# Patient Record
Sex: Male | Born: 1950 | Race: White | Hispanic: No | Marital: Married | Smoking: Current every day smoker
Health system: Southern US, Community
[De-identification: ages and names within clinical notes are randomized; demographics above are authoritative.]

## PROBLEM LIST (undated history)

## (undated) ENCOUNTER — Emergency Department (HOSPITAL_COMMUNITY): Admission: EM | Payer: Medicare Other | Source: Home / Self Care

## (undated) DIAGNOSIS — K449 Diaphragmatic hernia without obstruction or gangrene: Secondary | ICD-10-CM

## (undated) DIAGNOSIS — Z79899 Other long term (current) drug therapy: Secondary | ICD-10-CM

## (undated) DIAGNOSIS — Z972 Presence of dental prosthetic device (complete) (partial): Secondary | ICD-10-CM

## (undated) DIAGNOSIS — G5791 Unspecified mononeuropathy of right lower limb: Secondary | ICD-10-CM

## (undated) DIAGNOSIS — M810 Age-related osteoporosis without current pathological fracture: Secondary | ICD-10-CM

## (undated) DIAGNOSIS — G4700 Insomnia, unspecified: Secondary | ICD-10-CM

## (undated) DIAGNOSIS — F329 Major depressive disorder, single episode, unspecified: Secondary | ICD-10-CM

## (undated) DIAGNOSIS — N200 Calculus of kidney: Secondary | ICD-10-CM

## (undated) DIAGNOSIS — N401 Enlarged prostate with lower urinary tract symptoms: Secondary | ICD-10-CM

## (undated) DIAGNOSIS — I480 Paroxysmal atrial fibrillation: Secondary | ICD-10-CM

## (undated) DIAGNOSIS — J449 Chronic obstructive pulmonary disease, unspecified: Secondary | ICD-10-CM

## (undated) DIAGNOSIS — R635 Abnormal weight gain: Secondary | ICD-10-CM

## (undated) DIAGNOSIS — E785 Hyperlipidemia, unspecified: Secondary | ICD-10-CM

## (undated) DIAGNOSIS — I700 Atherosclerosis of aorta: Secondary | ICD-10-CM

## (undated) DIAGNOSIS — I4510 Unspecified right bundle-branch block: Secondary | ICD-10-CM

## (undated) DIAGNOSIS — F39 Unspecified mood [affective] disorder: Secondary | ICD-10-CM

## (undated) DIAGNOSIS — Z7901 Long term (current) use of anticoagulants: Secondary | ICD-10-CM

## (undated) DIAGNOSIS — I10 Essential (primary) hypertension: Secondary | ICD-10-CM

## (undated) DIAGNOSIS — F411 Generalized anxiety disorder: Secondary | ICD-10-CM

## (undated) DIAGNOSIS — G2581 Restless legs syndrome: Secondary | ICD-10-CM

## (undated) DIAGNOSIS — I509 Heart failure, unspecified: Secondary | ICD-10-CM

## (undated) DIAGNOSIS — N138 Other obstructive and reflux uropathy: Secondary | ICD-10-CM

## (undated) DIAGNOSIS — F319 Bipolar disorder, unspecified: Secondary | ICD-10-CM

## (undated) DIAGNOSIS — R413 Other amnesia: Secondary | ICD-10-CM

## (undated) DIAGNOSIS — K5790 Diverticulosis of intestine, part unspecified, without perforation or abscess without bleeding: Secondary | ICD-10-CM

## (undated) DIAGNOSIS — F32A Depression, unspecified: Secondary | ICD-10-CM

## (undated) DIAGNOSIS — G8929 Other chronic pain: Secondary | ICD-10-CM

## (undated) DIAGNOSIS — Z87442 Personal history of urinary calculi: Secondary | ICD-10-CM

## (undated) DIAGNOSIS — F0630 Mood disorder due to known physiological condition, unspecified: Secondary | ICD-10-CM

## (undated) DIAGNOSIS — I493 Ventricular premature depolarization: Secondary | ICD-10-CM

## (undated) DIAGNOSIS — F419 Anxiety disorder, unspecified: Secondary | ICD-10-CM

## (undated) DIAGNOSIS — Z973 Presence of spectacles and contact lenses: Secondary | ICD-10-CM

## (undated) HISTORY — DX: Anxiety disorder, unspecified: F41.9

## (undated) HISTORY — PX: TONSILLECTOMY: SUR1361

## (undated) HISTORY — DX: Other obstructive and reflux uropathy: N13.8

## (undated) HISTORY — DX: Major depressive disorder, single episode, unspecified: F32.9

## (undated) HISTORY — DX: Generalized anxiety disorder: F41.1

## (undated) HISTORY — PX: UPPER GI ENDOSCOPY: SHX6162

## (undated) HISTORY — DX: Heart failure, unspecified: I50.9

## (undated) HISTORY — DX: Bipolar disorder, unspecified: F31.9

## (undated) HISTORY — DX: Depression, unspecified: F32.A

## (undated) HISTORY — DX: Mood disorder due to known physiological condition, unspecified: F06.30

## (undated) HISTORY — PX: MULTIPLE TOOTH EXTRACTIONS: SHX2053

## (undated) HISTORY — DX: Hyperlipidemia, unspecified: E78.5

## (undated) HISTORY — DX: Insomnia, unspecified: G47.00

## (undated) HISTORY — DX: Other chronic pain: G89.29

## (undated) HISTORY — DX: Calculus of kidney: N20.0

## (undated) HISTORY — DX: Benign prostatic hyperplasia with lower urinary tract symptoms: N40.1

## (undated) HISTORY — DX: Abnormal weight gain: R63.5

---

## 2003-03-14 HISTORY — PX: HIP ARTHROPLASTY: SHX981

## 2003-03-14 HISTORY — PX: TOTAL HIP REVISION: SHX763

## 2003-10-08 ENCOUNTER — Ambulatory Visit (HOSPITAL_COMMUNITY): Admission: RE | Admit: 2003-10-08 | Discharge: 2003-10-08 | Payer: Self-pay | Admitting: Orthopedic Surgery

## 2003-10-22 ENCOUNTER — Emergency Department (HOSPITAL_COMMUNITY): Admission: EM | Admit: 2003-10-22 | Discharge: 2003-10-22 | Payer: Self-pay | Admitting: Emergency Medicine

## 2004-01-05 ENCOUNTER — Inpatient Hospital Stay (HOSPITAL_COMMUNITY): Admission: RE | Admit: 2004-01-05 | Discharge: 2004-01-09 | Payer: Self-pay | Admitting: Orthopaedic Surgery

## 2012-07-22 ENCOUNTER — Encounter (INDEPENDENT_AMBULATORY_CARE_PROVIDER_SITE_OTHER): Payer: Self-pay | Admitting: Surgery

## 2012-07-23 ENCOUNTER — Ambulatory Visit (INDEPENDENT_AMBULATORY_CARE_PROVIDER_SITE_OTHER): Payer: BC Managed Care – PPO | Admitting: Surgery

## 2012-07-23 ENCOUNTER — Encounter (INDEPENDENT_AMBULATORY_CARE_PROVIDER_SITE_OTHER): Payer: Self-pay | Admitting: Surgery

## 2012-07-23 VITALS — BP 122/78 | HR 76 | Temp 97.4°F | Resp 16 | Ht 72.0 in | Wt 206.0 lb

## 2012-07-23 DIAGNOSIS — K4020 Bilateral inguinal hernia, without obstruction or gangrene, not specified as recurrent: Secondary | ICD-10-CM

## 2012-07-23 HISTORY — DX: Bilateral inguinal hernia, without obstruction or gangrene, not specified as recurrent: K40.20

## 2012-07-23 NOTE — Progress Notes (Signed)
Patient ID: Carlos Mcduffey Jr., male   DOB: 09/30/1950, 61 y.o.   MRN: [id number]  Chief Complaint  Patient presents with  . Hernia    HPI Carlos Deeg Jr. is a 61 y.o. male.  Referred by Dr. 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 He developed pain in his left groin. Subsequently this developed into a bulge. Recently this has become significantly more painful with radiation down into his testicle. He was evaluated by Dr. Wilson who felt that he should have this surgically repaired. He presents now for evaluation. He denies any obstructive symptoms.  Past Medical History  Diagnosis Date  . Abnormal weight gain   . Anxiety   . Hypertrophy of prostate with urinary obstruction and other lower urinary tract symptoms (LUTS)   . Depression   . Hyperlipidemia   . Insomnia, unspecified   . Mood disorder in conditions classified elsewhere   . Other chronic pain     Past Surgical History  Procedure Laterality Date  . Total hip revision Left 2005    History reviewed. No pertinent family history.  Social History History  Substance Use Topics  . Smoking status: Current Every Day Smoker -- 0.50 packs/day    Types: Cigarettes  . Smokeless tobacco: Not on file  . Alcohol Use: No    No Known Allergies  Current Outpatient Prescriptions  Medication Sig Dispense Refill  . gabapentin (NEURONTIN) 300 MG capsule Take 300 mg by mouth 3 (three) times daily.      . LORazepam (ATIVAN) 0.5 MG tablet Take 0.5 mg by mouth every 8 (eight) hours.      . QUEtiapine (SEROQUEL) 100 MG tablet Take 100 mg by mouth at bedtime.      . terazosin (HYTRIN) 10 MG capsule Take 10 mg by mouth at bedtime.      . traMADol (ULTRAM) 50 MG tablet Take 50 mg by mouth every 6 (six) hours as needed for pain.       No current facility-administered medications for this visit.    Review of  Systems Review of Systems  Constitutional: Negative for fever, chills and unexpected weight change.  HENT: Negative for hearing loss, congestion, sore throat, trouble swallowing and voice change.   Eyes: Negative for visual disturbance.  Respiratory: Negative for cough and wheezing.   Cardiovascular: Negative for chest pain, palpitations and leg swelling.  Gastrointestinal: Positive for abdominal pain. Negative for nausea, vomiting, diarrhea, constipation, blood in stool, abdominal distention, anal bleeding and rectal pain.  Genitourinary: Positive for scrotal swelling and testicular pain. Negative for hematuria and difficulty urinating.  Musculoskeletal: Negative for arthralgias.  Skin: Negative for rash and wound.  Neurological: Negative for seizures, syncope, weakness and headaches.  Hematological: Negative for adenopathy. Does not bruise/bleed easily.  Psychiatric/Behavioral: Negative for confusion.    Blood pressure 122/78, pulse 76, temperature 97.4 Carlos (36.3 C), temperature source Temporal, resp. rate 16, height 6' (1.829 m), weight 206 lb (93.441 kg).  Physical Exam Physical Exam WDWN in NAD HEENT:  EOMI, sclera anicteric Neck:  No masses, no thyromegaly Lungs:  CTA bilaterally; normal respiratory effort CV:  Regular rate and rhythm; no murmurs Abd:  +bowel sounds, soft, non-tender, no masses GU:  Bilateral descended testes; no testicular masses Visible left inguinal hernia - reducible Moderate right inguinal hernia - reducible Ext:  Well-perfused; no edema Skin:  Warm,   dry; no sign of jaundice  Data Reviewed none  Assessment    Bilateral inguinal hernias - reducible     Plan    Laparoscopic preperitoneal bilateral inguinal hernia repairs with mesh.  The surgical procedure has been discussed with the patient.  Potential risks, benefits, alternative treatments, and expected outcomes have been explained.  All of the patient's questions at this time have been answered.   The likelihood of reaching the patient's treatment goal is good.  The patient understand the proposed surgical procedure and wishes to proceed.         Carlos Ferrari K. 07/23/2012, 6:03 PM    

## 2012-08-13 ENCOUNTER — Encounter (HOSPITAL_BASED_OUTPATIENT_CLINIC_OR_DEPARTMENT_OTHER): Payer: Self-pay | Admitting: *Deleted

## 2012-08-13 NOTE — Progress Notes (Signed)
No labs needed-no heart or resp problems 

## 2012-08-16 ENCOUNTER — Encounter (HOSPITAL_BASED_OUTPATIENT_CLINIC_OR_DEPARTMENT_OTHER): Payer: Self-pay | Admitting: *Deleted

## 2012-08-16 ENCOUNTER — Ambulatory Visit (HOSPITAL_BASED_OUTPATIENT_CLINIC_OR_DEPARTMENT_OTHER)
Admission: RE | Admit: 2012-08-16 | Discharge: 2012-08-16 | Disposition: A | Discharge status: Home / Self Care | Payer: BC Managed Care – PPO | Source: Ambulatory Visit | Attending: Surgery | Admitting: Surgery

## 2012-08-16 ENCOUNTER — Ambulatory Visit (HOSPITAL_BASED_OUTPATIENT_CLINIC_OR_DEPARTMENT_OTHER): Payer: BC Managed Care – PPO | Admitting: *Deleted

## 2012-08-16 ENCOUNTER — Encounter (HOSPITAL_BASED_OUTPATIENT_CLINIC_OR_DEPARTMENT_OTHER)
Admission: RE | Disposition: A | Discharge status: Home / Self Care | Payer: Self-pay | Source: Ambulatory Visit | Attending: Surgery

## 2012-08-16 DIAGNOSIS — G4700 Insomnia, unspecified: Secondary | ICD-10-CM | POA: Insufficient documentation

## 2012-08-16 DIAGNOSIS — F0630 Mood disorder due to known physiological condition, unspecified: Secondary | ICD-10-CM | POA: Insufficient documentation

## 2012-08-16 DIAGNOSIS — Z79899 Other long term (current) drug therapy: Secondary | ICD-10-CM | POA: Insufficient documentation

## 2012-08-16 DIAGNOSIS — R635 Abnormal weight gain: Secondary | ICD-10-CM | POA: Insufficient documentation

## 2012-08-16 DIAGNOSIS — F411 Generalized anxiety disorder: Secondary | ICD-10-CM | POA: Insufficient documentation

## 2012-08-16 DIAGNOSIS — K4020 Bilateral inguinal hernia, without obstruction or gangrene, not specified as recurrent: Secondary | ICD-10-CM | POA: Insufficient documentation

## 2012-08-16 DIAGNOSIS — F329 Major depressive disorder, single episode, unspecified: Secondary | ICD-10-CM | POA: Insufficient documentation

## 2012-08-16 DIAGNOSIS — N400 Enlarged prostate without lower urinary tract symptoms: Secondary | ICD-10-CM | POA: Insufficient documentation

## 2012-08-16 DIAGNOSIS — F3289 Other specified depressive episodes: Secondary | ICD-10-CM | POA: Insufficient documentation

## 2012-08-16 DIAGNOSIS — E785 Hyperlipidemia, unspecified: Secondary | ICD-10-CM | POA: Insufficient documentation

## 2012-08-16 DIAGNOSIS — F17200 Nicotine dependence, unspecified, uncomplicated: Secondary | ICD-10-CM | POA: Insufficient documentation

## 2012-08-16 HISTORY — PX: INSERTION OF MESH: SHX5868

## 2012-08-16 HISTORY — DX: Presence of dental prosthetic device (complete) (partial): Z97.2

## 2012-08-16 HISTORY — PX: INGUINAL HERNIA REPAIR: SHX194

## 2012-08-16 HISTORY — DX: Presence of spectacles and contact lenses: Z97.3

## 2012-08-16 SURGERY — REPAIR, HERNIA, INGUINAL, BILATERAL, LAPAROSCOPIC
Anesthesia: General | Site: Groin | Laterality: Bilateral | Wound class: Clean

## 2012-08-16 MED ORDER — HYDROMORPHONE HCL PF 1 MG/ML IJ SOLN: 1.0000 mg | INTRAMUSCULAR | Status: DC | PRN

## 2012-08-16 MED ORDER — DEXAMETHASONE SODIUM PHOSPHATE 4 MG/ML IJ SOLN
INTRAMUSCULAR | Status: DC | PRN
  Administered 2012-08-16: 10 mg via INTRAVENOUS

## 2012-08-16 MED ORDER — PROMETHAZINE HCL 25 MG/ML IJ SOLN: 6.2500 mg | INTRAMUSCULAR | Status: DC | PRN

## 2012-08-16 MED ORDER — LACTATED RINGERS IV SOLN
INTRAVENOUS | Status: DC
  Administered 2012-08-16 (×4): via INTRAVENOUS

## 2012-08-16 MED ORDER — GLYCOPYRROLATE 0.2 MG/ML IJ SOLN
INTRAMUSCULAR | Status: DC | PRN
  Administered 2012-08-16: 0.2 mg via INTRAVENOUS
  Administered 2012-08-16: .5 mg via INTRAVENOUS

## 2012-08-16 MED ORDER — OXYCODONE HCL 5 MG/5ML PO SOLN
5.0000 mg | Freq: Once | ORAL | Status: AC | PRN
Start: 2012-08-16 — End: 2012-08-16

## 2012-08-16 MED ORDER — CEFAZOLIN SODIUM-DEXTROSE 2-3 GM-% IV SOLR
2.0000 g | INTRAVENOUS | Status: AC
  Administered 2012-08-16: 2 g via INTRAVENOUS

## 2012-08-16 MED ORDER — ATROPINE SULFATE 0.4 MG/ML IJ SOLN
INTRAMUSCULAR | Status: DC | PRN
  Administered 2012-08-16: 0.2 mg via INTRAVENOUS

## 2012-08-16 MED ORDER — OXYCODONE-ACETAMINOPHEN 5-325 MG PO TABS: 1.0000 | ORAL_TABLET | ORAL | Status: DC | PRN

## 2012-08-16 MED ORDER — CHLORHEXIDINE GLUCONATE 4 % EX LIQD: 1.0000 "application " | Freq: Once | CUTANEOUS | Status: DC

## 2012-08-16 MED ORDER — BUPIVACAINE-EPINEPHRINE 0.25% -1:200000 IJ SOLN
INTRAMUSCULAR | Status: DC | PRN
  Administered 2012-08-16: 8 mL

## 2012-08-16 MED ORDER — ONDANSETRON HCL 4 MG/2ML IJ SOLN
INTRAMUSCULAR | Status: DC | PRN
  Administered 2012-08-16: 4 mg via INTRAVENOUS

## 2012-08-16 MED ORDER — NEOSTIGMINE METHYLSULFATE 1 MG/ML IJ SOLN
INTRAMUSCULAR | Status: DC | PRN
  Administered 2012-08-16: 3 mg via INTRAVENOUS

## 2012-08-16 MED ORDER — ONDANSETRON HCL 4 MG/2ML IJ SOLN: 4.0000 mg | INTRAMUSCULAR | Status: DC | PRN

## 2012-08-16 MED ORDER — ROCURONIUM BROMIDE 100 MG/10ML IV SOLN
INTRAVENOUS | Status: DC | PRN
  Administered 2012-08-16: 50 mg via INTRAVENOUS

## 2012-08-16 MED ORDER — LIDOCAINE HCL 4 % MT SOLN
OROMUCOSAL | Status: DC | PRN
  Administered 2012-08-16: 4 mL via TOPICAL

## 2012-08-16 MED ORDER — MIDAZOLAM HCL 5 MG/5ML IJ SOLN
INTRAMUSCULAR | Status: DC | PRN
  Administered 2012-08-16: 2 mg via INTRAVENOUS

## 2012-08-16 MED ORDER — FENTANYL CITRATE 0.05 MG/ML IJ SOLN
INTRAMUSCULAR | Status: DC | PRN
  Administered 2012-08-16: 25 ug via INTRAVENOUS
  Administered 2012-08-16: 100 ug via INTRAVENOUS
  Administered 2012-08-16: 50 ug via INTRAVENOUS
  Administered 2012-08-16: 25 ug via INTRAVENOUS

## 2012-08-16 MED ORDER — HYDROMORPHONE HCL PF 1 MG/ML IJ SOLN
0.2500 mg | INTRAMUSCULAR | Status: DC | PRN
  Administered 2012-08-16 (×2): 0.5 mg via INTRAVENOUS

## 2012-08-16 MED ORDER — OXYCODONE HCL 5 MG PO TABS
5.0000 mg | ORAL_TABLET | Freq: Once | ORAL | Status: AC | PRN
Start: 2012-08-16 — End: 2012-08-16
  Administered 2012-08-16: 5 mg via ORAL

## 2012-08-16 MED ORDER — PROPOFOL 10 MG/ML IV BOLUS
INTRAVENOUS | Status: DC | PRN
  Administered 2012-08-16: 200 mg via INTRAVENOUS

## 2012-08-16 SURGICAL SUPPLY — 35 items
APL SKNCLS STERI-STRIP NONHPOA (GAUZE/BANDAGES/DRESSINGS) ×2
APPLIER CLIP LOGIC TI 5 (MISCELLANEOUS) IMPLANT
APR CLP MED LRG 33X5 (MISCELLANEOUS)
BENZOIN TINCTURE PRP APPL 2/3 (GAUZE/BANDAGES/DRESSINGS) ×3 IMPLANT
CANISTER SUCTION 2500CC (MISCELLANEOUS) IMPLANT
CLOTH BEACON ORANGE TIMEOUT ST (SAFETY) ×3 IMPLANT
DECANTER SPIKE VIAL GLASS SM (MISCELLANEOUS) ×2 IMPLANT
DEVICE SECURE STRAP 25 ABSORB (INSTRUMENTS) ×3 IMPLANT
DISSECT BALLN SPACEMKR + OVL (BALLOONS) ×3
DISSECTOR BALLN SPACEMKR + OVL (BALLOONS) ×2 IMPLANT
DISSECTOR BLUNT TIP ENDO 5MM (MISCELLANEOUS) IMPLANT
DRAPE UTILITY XL STRL (DRAPES) ×5 IMPLANT
DRSG TEGADERM 2-3/8X2-3/4 SM (GAUZE/BANDAGES/DRESSINGS) ×2 IMPLANT
DRSG TEGADERM 4X4.75 (GAUZE/BANDAGES/DRESSINGS) ×1 IMPLANT
ELECT REM PT RETURN 9FT ADLT (ELECTROSURGICAL) ×3
ELECTRODE REM PT RTRN 9FT ADLT (ELECTROSURGICAL) ×2 IMPLANT
GLOVE BIO SURGEON STRL SZ 6.5 (GLOVE) ×1 IMPLANT
GLOVE BIO SURGEON STRL SZ7 (GLOVE) ×4 IMPLANT
GLOVE BIOGEL PI IND STRL 7.0 (GLOVE) IMPLANT
GLOVE BIOGEL PI IND STRL 7.5 (GLOVE) ×2 IMPLANT
GLOVE BIOGEL PI INDICATOR 7.0 (GLOVE) ×1
GLOVE BIOGEL PI INDICATOR 7.5 (GLOVE) ×1
GLOVE EXAM NITRILE MD LF STRL (GLOVE) ×2 IMPLANT
GOWN PREVENTION PLUS XLARGE (GOWN DISPOSABLE) ×6 IMPLANT
MESH 3DMAX LIGHT 4.1X6.2 LT LR (Mesh General) ×1 IMPLANT
MESH 3DMAX LIGHT 4.1X6.2 RT LR (Mesh General) ×1 IMPLANT
NS IRRIG 1000ML POUR BTL (IV SOLUTION) ×3 IMPLANT
PACK BASIN DAY SURGERY FS (CUSTOM PROCEDURE TRAY) ×3 IMPLANT
SET IRRIG TUBING LAPAROSCOPIC (IRRIGATION / IRRIGATOR) IMPLANT
SET TROCAR LAP APPLE-HUNT 5MM (ENDOMECHANICALS) ×3 IMPLANT
SPONGE GAUZE 2X2 8PLY STRL LF (GAUZE/BANDAGES/DRESSINGS) ×1 IMPLANT
SUT MNCRL AB 4-0 PS2 18 (SUTURE) ×3 IMPLANT
TOWEL OR 17X24 6PK STRL BLUE (TOWEL DISPOSABLE) ×3 IMPLANT
TRAY FOLEY CATH 14FR (SET/KITS/TRAYS/PACK) ×3 IMPLANT
TRAY LAPAROSCOPIC (CUSTOM PROCEDURE TRAY) ×3 IMPLANT

## 2012-08-16 NOTE — Anesthesia Preprocedure Evaluation (Signed)
Anesthesia Evaluation  Patient identified by MRN, date of birth, ID band Patient awake    Reviewed: Allergy & Precautions, H&P , NPO status , Patient's Chart, lab work & pertinent test results  Airway Mallampati: II TM Distance: >3 FB Neck ROM: Full    Dental  (+) Partial Upper and Dental Advisory Given   Pulmonary Current Smoker,    Pulmonary exam normal       Cardiovascular negative cardio ROS      Neuro/Psych PSYCHIATRIC DISORDERS Anxiety Depression negative neurological ROS     GI/Hepatic negative GI ROS, Neg liver ROS,   Endo/Other  negative endocrine ROS  Renal/GU negative Renal ROS     Musculoskeletal   Abdominal   Peds  Hematology negative hematology ROS (+)   Anesthesia Other Findings   Reproductive/Obstetrics                           Anesthesia Physical Anesthesia Plan  ASA: II  Anesthesia Plan: General   Post-op Pain Management:    Induction: Intravenous  Airway Management Planned: Oral ETT  Additional Equipment:   Intra-op Plan:   Post-operative Plan: Extubation in OR  Informed Consent: I have reviewed the patients History and Physical, chart, labs and discussed the procedure including the risks, benefits and alternatives for the proposed anesthesia with the patient or authorized representative who has indicated his/her understanding and acceptance.   Dental advisory given  Plan Discussed with: CRNA, Anesthesiologist and Surgeon  Anesthesia Plan Comments:         Anesthesia Quick Evaluation

## 2012-08-16 NOTE — Op Note (Signed)
Preop diagnosis: Bilateral inguinal hernias Postop diagnosis: Same Procedure performed: Laparoscopic bilateral preperitoneal inguinal hernia repairs with mesh Surgeon:Twana Wileman K. Anesthesia: Gen. Endotracheal Indications: This is a 62 year old male who presents with bilateral inguinal hernias. They remains reducible. He was examined in the office and was felt a candidate for laparoscopic bilateral hernia repairs.  Description of procedure: The patient brought to the operating room and placed in supine position on the operating room table. After an adequate level of general anesthesia was obtained a Foley catheter was placed under sterile technique. The patient's lower abdomen was shaved prepped with chlor prep and his groin was prepped with Betadine. We draped in sterile fashion. A timeout was taken to ensure the proper patient proper procedure. We selected a point about 2 cm below the umbilicus just to the right of midline. We made a transverse incision here. Dissection was carried down to the anterior rectus sheath which was incised transversely. We bluntly dissected around the rectus muscle retracted it laterally. We entered the rectum muscular space. We then inserted the spacemaker balloon device in the rectum muscular space down to the symphysis pubis. The obturator was removed and we inserted the 10 mm laparoscope. We inflated the balloon completely and held this for several minutes for hemostasis. This was done under direct vision with the scope inside the balloon. We then removed the scope and the spacemaker balloon. We insufflated CO2 into the preperitoneal space maintaining a maximum pressure of 15 mm mercury. Initially the patient had some hypotension and bradycardia with insufflation. We released our insufflation and anesthesia treated his bradycardia. Once his vital signs were stable, we reinsufflated the preperitoneal space. We inserted 2 5 mm trochars in the lower midline. We began working  on the patient's left side. We open the preperitoneal space all the way out past the anterior superior iliac spine. We examined the abdominal wall and found no evidence of a direct hernia defect. We then reduced a fairly large indirect hernia. We reduced this  completely back to the peritoneum.  Once we had completed our dissection we used a large left-sided piece of 3-D max light mesh. This was rolled up and inserted into the preperitoneal space. We unrolled the mesh and positioned it in the correct position. This mesh covered both the indirect and direct spaces and extended medially to the midline. The lower edge of the mesh sat below the edge of the peritoneum. We secured this with 4 secure strap tacks. We then turned our attention to the patient's right side. We opened the preperitoneal space out to the anterior superior iliac spine.  The patient has no direct hernia defect. He has a smaller indirect hernia on the right. We reduces completely. We used a large right-sided piece of 3-D max light mesh. This was positioned in similar fashion to the left. This extended from the midline out close to the anterior superior iliac . The lower edge septal lobe of the peritoneum. We secured this with 4 secure strap tacks. We inspected for hemostasis. We then released pneumo preperitoneum under direct vision. Both hernia sacs came to lay on top of the mesh. The patient has significant air that had tracked down into his scrotum. We squeezed as much of this out as possible. We then removed our trochars. The anterior rectus sheath was closed with 0 Vicryl. 4 Monocryl was used for the skin incisions. Steri-Strips and clean dressings were applied. The Foley catheter was removed and the patient was then extubated and brought to  the recovery room in stable condition. All sponge, instrument, and needle counts are correct.  Wilmon Arms. Corliss Skains, MD, [facility]  General/ Trauma Surgery  08/16/2012 2:18 PM

## 2012-08-16 NOTE — Interval H&P Note (Signed)
History and Physical Interval Note:  08/16/2012 10:53 AM  [name]  has presented today for surgery, with the diagnosis of bilateral inguinal hernias  The various methods of treatment have been discussed with the patient and family. After consideration of risks, benefits and other options for treatment, the patient has consented to  Procedure(s): LAPAROSCOPIC BILATERAL INGUINAL HERNIA REPAIR (Bilateral) INSERTION OF MESH (Bilateral) as a surgical intervention .  The patient's history has been reviewed, patient examined, no change in status, stable for surgery.  I have reviewed the patient's chart and labs.  Questions were answered to the patient's satisfaction.     Monserratt Knezevic K.

## 2012-08-16 NOTE — Transfer of Care (Signed)
Immediate Anesthesia Transfer of Care Note  Patient: Carlos Short.  Procedure(s) Performed: Procedure(s): LAPAROSCOPIC BILATERAL INGUINAL HERNIA REPAIR (Bilateral) INSERTION OF MESH (Bilateral)  Patient Location: PACU  Anesthesia Type:General  Level of Consciousness: awake, alert  and oriented  Airway & Oxygen Therapy: Patient Spontanous Breathing and Patient connected to face mask oxygen  Post-op Assessment: Report given to PACU RN, Post -op Vital signs reviewed and stable and Patient moving all extremities  Post vital signs: Reviewed and stable  Complications: No apparent anesthesia complications

## 2012-08-16 NOTE — H&P (View-Only) (Signed)
Patient ID: Carlos Short., male   DOB: 11-Nov-1950, 62 y.o.   MRN: [id number]  Chief Complaint  Patient presents with  . Hernia    HPI [name] is a 62 y.o. male.  Referred by Dr. 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 He developed pain in his left groin. Subsequently this developed into a bulge. Recently this has become significantly more painful with radiation down into his testicle. He was evaluated by Dr. Andrey Campanile who felt that he should have this surgically repaired. He presents now for evaluation. He denies any obstructive symptoms.  Past Medical History  Diagnosis Date  . Abnormal weight gain   . Anxiety   . Hypertrophy of prostate with urinary obstruction and other lower urinary tract symptoms (LUTS)   . Depression   . Hyperlipidemia   . Insomnia, unspecified   . Mood disorder in conditions classified elsewhere   . Other chronic pain     Past Surgical History  Procedure Laterality Date  . Total hip revision Left 2005    History reviewed. No pertinent family history.  Social History History  Substance Use Topics  . Smoking status: Current Every Day Smoker -- 0.50 packs/day    Types: Cigarettes  . Smokeless tobacco: Not on file  . Alcohol Use: No    No Known Allergies  Current Outpatient Prescriptions  Medication Sig Dispense Refill  . gabapentin (NEURONTIN) 300 MG capsule Take 300 mg by mouth 3 (three) times daily.      Marland Kitchen LORazepam (ATIVAN) 0.5 MG tablet Take 0.5 mg by mouth every 8 (eight) hours.      Marland Kitchen QUEtiapine (SEROQUEL) 100 MG tablet Take 100 mg by mouth at bedtime.      Marland Kitchen terazosin (HYTRIN) 10 MG capsule Take 10 mg by mouth at bedtime.      . traMADol (ULTRAM) 50 MG tablet Take 50 mg by mouth every 6 (six) hours as needed for pain.       No current facility-administered medications for this visit.    Review of  Systems Review of Systems  Constitutional: Negative for fever, chills and unexpected weight change.  HENT: Negative for hearing loss, congestion, sore throat, trouble swallowing and voice change.   Eyes: Negative for visual disturbance.  Respiratory: Negative for cough and wheezing.   Cardiovascular: Negative for chest pain, palpitations and leg swelling.  Gastrointestinal: Positive for abdominal pain. Negative for nausea, vomiting, diarrhea, constipation, blood in stool, abdominal distention, anal bleeding and rectal pain.  Genitourinary: Positive for scrotal swelling and testicular pain. Negative for hematuria and difficulty urinating.  Musculoskeletal: Negative for arthralgias.  Skin: Negative for rash and wound.  Neurological: Negative for seizures, syncope, weakness and headaches.  Hematological: Negative for adenopathy. Does not bruise/bleed easily.  Psychiatric/Behavioral: Negative for confusion.    Blood pressure 122/78, pulse 76, temperature 97.4 F (36.3 C), temperature source Temporal, resp. rate 16, height 6' (1.829 m), weight 206 lb (93.441 kg).  Physical Exam Physical Exam WDWN in NAD HEENT:  EOMI, sclera anicteric Neck:  No masses, no thyromegaly Lungs:  CTA bilaterally; normal respiratory effort CV:  Regular rate and rhythm; no murmurs Abd:  +bowel sounds, soft, non-tender, no masses GU:  Bilateral descended testes; no testicular masses Visible left inguinal hernia - reducible Moderate right inguinal hernia - reducible Ext:  Well-perfused; no edema Skin:  Warm,  dry; no sign of jaundice  Data Reviewed none  Assessment    Bilateral inguinal hernias - reducible     Plan    Laparoscopic preperitoneal bilateral inguinal hernia repairs with mesh.  The surgical procedure has been discussed with the patient.  Potential risks, benefits, alternative treatments, and expected outcomes have been explained.  All of the patient's questions at this time have been answered.   The likelihood of reaching the patient's treatment goal is good.  The patient understand the proposed surgical procedure and wishes to proceed.         Carlos Short K. 07/23/2012, 6:03 PM

## 2012-08-16 NOTE — Anesthesia Postprocedure Evaluation (Signed)
Anesthesia Post Note  Patient: Carlos Short.  Procedure(s) Performed: Procedure(s) (LRB): LAPAROSCOPIC BILATERAL INGUINAL HERNIA REPAIR (Bilateral) INSERTION OF MESH (Bilateral)  Anesthesia type: general  Patient location: PACU  Post pain: Pain level controlled  Post assessment: Patient's Cardiovascular Status Stable  Last Vitals:  Filed Vitals:   08/16/12 1530  BP:   Pulse: 54  Temp:   Resp: 20    Post vital signs: Reviewed and stable  Level of consciousness: sedated  Complications: No apparent anesthesia complications

## 2012-08-16 NOTE — Anesthesia Procedure Notes (Signed)
Procedure Name: Intubation Date/Time: 08/16/2012 12:39 PM Performed by: Meyer Russel Pre-anesthesia Checklist: Patient identified, Emergency Drugs available, Suction available and Patient being monitored Patient Re-evaluated:Patient Re-evaluated prior to inductionOxygen Delivery Method: Circle System Utilized Preoxygenation: Pre-oxygenation with 100% oxygen Intubation Type: IV induction Ventilation: Mask ventilation without difficulty Laryngoscope Size: Miller and 2 Grade View: Grade I Tube type: Oral Tube size: 8.0 mm Number of attempts: 1 Airway Equipment and Method: LTA kit utilized Placement Confirmation: ETT inserted through vocal cords under direct vision,  positive ETCO2 and breath sounds checked- equal and bilateral Secured at: 24 cm Tube secured with: Tape Dental Injury: Teeth and Oropharynx as per pre-operative assessment

## 2012-08-19 ENCOUNTER — Encounter (HOSPITAL_BASED_OUTPATIENT_CLINIC_OR_DEPARTMENT_OTHER): Payer: Self-pay | Admitting: Surgery

## 2012-08-23 ENCOUNTER — Other Ambulatory Visit (INDEPENDENT_AMBULATORY_CARE_PROVIDER_SITE_OTHER): Payer: Self-pay

## 2012-08-23 ENCOUNTER — Telehealth (INDEPENDENT_AMBULATORY_CARE_PROVIDER_SITE_OTHER): Payer: Self-pay

## 2012-08-23 ENCOUNTER — Telehealth (INDEPENDENT_AMBULATORY_CARE_PROVIDER_SITE_OTHER): Payer: Self-pay | Admitting: *Deleted

## 2012-08-23 DIAGNOSIS — G8918 Other acute postprocedural pain: Secondary | ICD-10-CM

## 2012-08-23 MED ORDER — OXYCODONE-ACETAMINOPHEN 5-325 MG PO TABS: 1.0000 | ORAL_TABLET | ORAL | Status: DC | PRN

## 2012-08-23 NOTE — Telephone Encounter (Signed)
Called and left message for patient.  Rec'd a refill request for Oxycodone 5/325 mg.  Dr. Corliss Skains not available.  Prescription refill request given to Dr. Johna Sheriff (Urgent Office)  Patient s/p Lap BIH 08/16/12.  RX at front desk for patient to pick up.

## 2012-08-23 NOTE — Telephone Encounter (Signed)
Wife called to request a refill of patients pain medication to insure he doesn't run out over the weekend.  Per protocol Norco 5/325mg  1 tablet every 4-6 hours as needed for pain #30 no refills was called into [phone].

## 2012-09-03 ENCOUNTER — Ambulatory Visit (INDEPENDENT_AMBULATORY_CARE_PROVIDER_SITE_OTHER): Payer: BC Managed Care – PPO | Admitting: Surgery

## 2012-09-03 ENCOUNTER — Encounter (INDEPENDENT_AMBULATORY_CARE_PROVIDER_SITE_OTHER): Payer: Self-pay | Admitting: Surgery

## 2012-09-03 VITALS — BP 128/86 | HR 60 | Temp 97.4°F | Resp 12 | Ht 72.0 in | Wt 203.8 lb

## 2012-09-03 DIAGNOSIS — K4020 Bilateral inguinal hernia, without obstruction or gangrene, not specified as recurrent: Secondary | ICD-10-CM

## 2012-09-03 MED ORDER — HYDROCODONE-ACETAMINOPHEN 5-325 MG PO TABS: 1.0000 | ORAL_TABLET | Freq: Four times a day (QID) | ORAL | Status: DC | PRN

## 2012-09-03 NOTE — Progress Notes (Signed)
Status post laparoscopic repair of bilateral inguinal hernias on 08/16/13. The patient seems to be doing fairly well. No sign of recurrent hernia. Incisions are all well-healed. His main complaint is occasional sharp pain shooting down to his left testicle. There are no testicular masses. The spermatic cord is nontender. His tenderness seems to be at the external ring. It is possible that he is having some impingement on the ileo inguinal nerve. The tacks holding the mesh in place are absorbable.    He should continue limiting his level of activity over the next few weeks. No heavy lifting. He may use Advil as needed for the nerve pain. He should call us back if there's no improvement.  Carlos Short. Corliss Skains, MD, [facility]  General/ Trauma Surgery  09/03/2012 2:07 PM

## 2013-01-16 ENCOUNTER — Other Ambulatory Visit: Payer: Self-pay

## 2015-04-26 DIAGNOSIS — Z8719 Personal history of other diseases of the digestive system: Secondary | ICD-10-CM | POA: Insufficient documentation

## 2015-04-26 DIAGNOSIS — Z87442 Personal history of urinary calculi: Secondary | ICD-10-CM | POA: Insufficient documentation

## 2015-06-08 DIAGNOSIS — F4323 Adjustment disorder with mixed anxiety and depressed mood: Secondary | ICD-10-CM | POA: Insufficient documentation

## 2015-11-09 DIAGNOSIS — K2900 Acute gastritis without bleeding: Secondary | ICD-10-CM | POA: Insufficient documentation

## 2015-11-09 HISTORY — DX: Acute gastritis without bleeding: K29.00

## 2016-04-13 DIAGNOSIS — G894 Chronic pain syndrome: Secondary | ICD-10-CM | POA: Diagnosis present

## 2016-04-13 DIAGNOSIS — N401 Enlarged prostate with lower urinary tract symptoms: Secondary | ICD-10-CM | POA: Insufficient documentation

## 2016-04-13 DIAGNOSIS — R3912 Poor urinary stream: Secondary | ICD-10-CM

## 2016-04-13 HISTORY — DX: Benign prostatic hyperplasia with lower urinary tract symptoms: N40.1

## 2016-04-13 HISTORY — DX: Chronic pain syndrome: G89.4

## 2016-09-08 ENCOUNTER — Other Ambulatory Visit: Payer: Self-pay | Admitting: Physician Assistant

## 2016-09-08 DIAGNOSIS — M8008XG Age-related osteoporosis with current pathological fracture, vertebra(e), subsequent encounter for fracture with delayed healing: Secondary | ICD-10-CM

## 2016-09-08 DIAGNOSIS — M8008XD Age-related osteoporosis with current pathological fracture, vertebra(e), subsequent encounter for fracture with routine healing: Secondary | ICD-10-CM

## 2016-09-14 ENCOUNTER — Other Ambulatory Visit: Payer: Self-pay

## 2016-10-06 DIAGNOSIS — S32010A Wedge compression fracture of first lumbar vertebra, initial encounter for closed fracture: Secondary | ICD-10-CM

## 2016-10-06 HISTORY — DX: Wedge compression fracture of first lumbar vertebra, initial encounter for closed fracture: S32.010A

## 2016-10-19 ENCOUNTER — Inpatient Hospital Stay
Admission: RE | Admit: 2016-10-19 | Discharge: 2016-10-19 | Disposition: A | Discharge status: Home / Self Care | Payer: Self-pay | Source: Ambulatory Visit | Attending: Physician Assistant | Admitting: Physician Assistant

## 2017-04-11 DIAGNOSIS — G8929 Other chronic pain: Secondary | ICD-10-CM

## 2017-04-11 DIAGNOSIS — G2581 Restless legs syndrome: Secondary | ICD-10-CM | POA: Insufficient documentation

## 2017-04-11 DIAGNOSIS — Z0000 Encounter for general adult medical examination without abnormal findings: Secondary | ICD-10-CM | POA: Insufficient documentation

## 2017-04-11 DIAGNOSIS — M545 Low back pain: Secondary | ICD-10-CM

## 2017-04-11 HISTORY — DX: Other chronic pain: G89.29

## 2017-05-16 ENCOUNTER — Other Ambulatory Visit: Payer: Self-pay | Admitting: Family Medicine

## 2017-05-16 DIAGNOSIS — S32010D Wedge compression fracture of first lumbar vertebra, subsequent encounter for fracture with routine healing: Secondary | ICD-10-CM

## 2017-05-21 ENCOUNTER — Ambulatory Visit
Admission: RE | Admit: 2017-05-21 | Discharge: 2017-05-21 | Disposition: A | Discharge status: Home / Self Care | Payer: Medicare Other | Source: Ambulatory Visit | Attending: Physician Assistant | Admitting: Physician Assistant

## 2017-05-21 DIAGNOSIS — M8008XG Age-related osteoporosis with current pathological fracture, vertebra(e), subsequent encounter for fracture with delayed healing: Secondary | ICD-10-CM

## 2017-05-25 ENCOUNTER — Ambulatory Visit
Admission: RE | Admit: 2017-05-25 | Discharge: 2017-05-25 | Disposition: A | Discharge status: Home / Self Care | Payer: Medicare Other | Source: Ambulatory Visit | Attending: Family Medicine | Admitting: Family Medicine

## 2017-05-25 DIAGNOSIS — S32010D Wedge compression fracture of first lumbar vertebra, subsequent encounter for fracture with routine healing: Secondary | ICD-10-CM

## 2017-06-13 ENCOUNTER — Other Ambulatory Visit: Payer: Self-pay

## 2017-06-13 ENCOUNTER — Inpatient Hospital Stay (HOSPITAL_COMMUNITY)
Admission: EM | Admit: 2017-06-13 | Discharge: 2017-06-14 | DRG: 871 | Disposition: A | Discharge status: Home / Self Care | Payer: Medicare Other | Attending: Internal Medicine | Admitting: Internal Medicine

## 2017-06-13 ENCOUNTER — Emergency Department (HOSPITAL_COMMUNITY): Payer: Medicare Other

## 2017-06-13 ENCOUNTER — Encounter (HOSPITAL_COMMUNITY): Payer: Self-pay

## 2017-06-13 DIAGNOSIS — E785 Hyperlipidemia, unspecified: Secondary | ICD-10-CM | POA: Diagnosis not present

## 2017-06-13 DIAGNOSIS — F419 Anxiety disorder, unspecified: Secondary | ICD-10-CM | POA: Diagnosis present

## 2017-06-13 DIAGNOSIS — Z79899 Other long term (current) drug therapy: Secondary | ICD-10-CM | POA: Diagnosis not present

## 2017-06-13 DIAGNOSIS — G9341 Metabolic encephalopathy: Secondary | ICD-10-CM

## 2017-06-13 DIAGNOSIS — I714 Abdominal aortic aneurysm, without rupture, unspecified: Secondary | ICD-10-CM

## 2017-06-13 DIAGNOSIS — I7143 Infrarenal abdominal aortic aneurysm, without rupture: Secondary | ICD-10-CM

## 2017-06-13 DIAGNOSIS — M4856XA Collapsed vertebra, not elsewhere classified, lumbar region, initial encounter for fracture: Secondary | ICD-10-CM | POA: Diagnosis present

## 2017-06-13 DIAGNOSIS — A419 Sepsis, unspecified organism: Secondary | ICD-10-CM | POA: Diagnosis present

## 2017-06-13 DIAGNOSIS — G4700 Insomnia, unspecified: Secondary | ICD-10-CM | POA: Diagnosis not present

## 2017-06-13 DIAGNOSIS — N390 Urinary tract infection, site not specified: Secondary | ICD-10-CM

## 2017-06-13 DIAGNOSIS — F39 Unspecified mood [affective] disorder: Secondary | ICD-10-CM

## 2017-06-13 DIAGNOSIS — N400 Enlarged prostate without lower urinary tract symptoms: Secondary | ICD-10-CM | POA: Diagnosis not present

## 2017-06-13 DIAGNOSIS — S32010A Wedge compression fracture of first lumbar vertebra, initial encounter for closed fracture: Secondary | ICD-10-CM | POA: Diagnosis present

## 2017-06-13 DIAGNOSIS — Z87891 Personal history of nicotine dependence: Secondary | ICD-10-CM | POA: Diagnosis not present

## 2017-06-13 DIAGNOSIS — G894 Chronic pain syndrome: Secondary | ICD-10-CM | POA: Diagnosis present

## 2017-06-13 DIAGNOSIS — F329 Major depressive disorder, single episode, unspecified: Secondary | ICD-10-CM | POA: Diagnosis present

## 2017-06-13 HISTORY — DX: Urinary tract infection, site not specified: N39.0

## 2017-06-13 HISTORY — DX: Infrarenal abdominal aortic aneurysm, without rupture: I71.43

## 2017-06-13 HISTORY — DX: Metabolic encephalopathy: G93.41

## 2017-06-13 LAB — CBC WITH DIFFERENTIAL/PLATELET
BASOS PCT: 0 %
Basophils Absolute: 0 10*3/uL (ref 0.0–0.1)
EOS ABS: 0.1 10*3/uL (ref 0.0–0.7)
EOS PCT: 1 %
HCT: 44.7 % (ref 39.0–52.0)
Hemoglobin: 15.8 g/dL (ref 13.0–17.0)
Lymphocytes Relative: 9 %
Lymphs Abs: 0.7 10*3/uL (ref 0.7–4.0)
MCH: 32 pg (ref 26.0–34.0)
MCHC: 35.3 g/dL (ref 30.0–36.0)
MCV: 90.7 fL (ref 78.0–100.0)
MONO ABS: 0.3 10*3/uL (ref 0.1–1.0)
Monocytes Relative: 4 %
NEUTROS ABS: 6.9 10*3/uL (ref 1.7–7.7)
Neutrophils Relative %: 86 %
PLATELETS: 148 10*3/uL — AB (ref 150–400)
RBC: 4.93 MIL/uL (ref 4.22–5.81)
RDW: 13.1 % (ref 11.5–15.5)
WBC: 8 10*3/uL (ref 4.0–10.5)

## 2017-06-13 LAB — URINALYSIS, ROUTINE W REFLEX MICROSCOPIC
Bilirubin Urine: NEGATIVE
GLUCOSE, UA: NEGATIVE mg/dL
Hgb urine dipstick: NEGATIVE
Ketones, ur: NEGATIVE mg/dL
Nitrite: POSITIVE — AB
PH: 8 (ref 5.0–8.0)
PROTEIN: NEGATIVE mg/dL
Specific Gravity, Urine: 1.015 (ref 1.005–1.030)

## 2017-06-13 LAB — BASIC METABOLIC PANEL
ANION GAP: 9 (ref 5–15)
BUN: 17 mg/dL (ref 6–20)
CALCIUM: 8.8 mg/dL — AB (ref 8.9–10.3)
CO2: 22 mmol/L (ref 22–32)
CREATININE: 1.11 mg/dL (ref 0.61–1.24)
Chloride: 105 mmol/L (ref 101–111)
Glucose, Bld: 106 mg/dL — ABNORMAL HIGH (ref 65–99)
Potassium: 3.6 mmol/L (ref 3.5–5.1)
Sodium: 136 mmol/L (ref 135–145)

## 2017-06-13 LAB — I-STAT CG4 LACTIC ACID, ED: Lactic Acid, Venous: 0.93 mmol/L (ref 0.5–1.9)

## 2017-06-13 LAB — C-REACTIVE PROTEIN: CRP: 1.2 mg/dL — AB (ref <1.0)

## 2017-06-13 LAB — SEDIMENTATION RATE: Sed Rate: 12 mm/hr (ref 0–16)

## 2017-06-13 MED ORDER — ONDANSETRON HCL 4 MG/2ML IJ SOLN
4.0000 mg | Freq: Once | INTRAMUSCULAR | Status: AC
  Administered 2017-06-13: 4 mg via INTRAVENOUS
  Filled 2017-06-13: qty 2

## 2017-06-13 MED ORDER — DOXAZOSIN MESYLATE 8 MG PO TABS
16.0000 mg | ORAL_TABLET | Freq: Every day | ORAL | Status: DC
  Administered 2017-06-13 – 2017-06-14 (×2): 16 mg via ORAL
  Filled 2017-06-13 (×2): qty 2

## 2017-06-13 MED ORDER — ONDANSETRON HCL 4 MG/2ML IJ SOLN: 4.0000 mg | Freq: Four times a day (QID) | INTRAMUSCULAR | Status: DC | PRN

## 2017-06-13 MED ORDER — PROCHLORPERAZINE EDISYLATE 5 MG/ML IJ SOLN
10.0000 mg | Freq: Four times a day (QID) | INTRAMUSCULAR | Status: DC | PRN
  Administered 2017-06-13: 10 mg via INTRAVENOUS
  Filled 2017-06-13: qty 2

## 2017-06-13 MED ORDER — SERTRALINE HCL 50 MG PO TABS
150.0000 mg | ORAL_TABLET | Freq: Every day | ORAL | Status: DC
  Administered 2017-06-13 – 2017-06-14 (×2): 150 mg via ORAL
  Filled 2017-06-13 (×2): qty 1

## 2017-06-13 MED ORDER — VANCOMYCIN HCL 10 G IV SOLR
2000.0000 mg | Freq: Once | INTRAVENOUS | Status: AC
  Administered 2017-06-13: 2000 mg via INTRAVENOUS
  Filled 2017-06-13: qty 2000

## 2017-06-13 MED ORDER — ACETAMINOPHEN 500 MG PO TABS
1000.0000 mg | ORAL_TABLET | Freq: Once | ORAL | Status: AC
  Administered 2017-06-13: 1000 mg via ORAL
  Filled 2017-06-13: qty 2

## 2017-06-13 MED ORDER — DEXAMETHASONE SODIUM PHOSPHATE 10 MG/ML IJ SOLN
10.0000 mg | Freq: Once | INTRAMUSCULAR | Status: AC
  Administered 2017-06-13: 10 mg via INTRAVENOUS
  Filled 2017-06-13: qty 1

## 2017-06-13 MED ORDER — CEFEPIME HCL 2 G IJ SOLR
2.0000 g | Freq: Once | INTRAMUSCULAR | Status: AC
  Administered 2017-06-13: 2 g via INTRAVENOUS
  Filled 2017-06-13: qty 2

## 2017-06-13 MED ORDER — CYCLOBENZAPRINE HCL 5 MG PO TABS
5.0000 mg | ORAL_TABLET | Freq: Three times a day (TID) | ORAL | Status: DC | PRN
  Administered 2017-06-13: 5 mg via ORAL
  Filled 2017-06-13 (×2): qty 1

## 2017-06-13 MED ORDER — SODIUM CHLORIDE 0.9 % IV BOLUS
1000.0000 mL | Freq: Once | INTRAVENOUS | Status: AC
  Administered 2017-06-13: 1000 mL via INTRAVENOUS

## 2017-06-13 MED ORDER — HYDROMORPHONE HCL 1 MG/ML IJ SOLN
1.0000 mg | Freq: Once | INTRAMUSCULAR | Status: AC
  Administered 2017-06-13: 1 mg via INTRAVENOUS
  Filled 2017-06-13: qty 1

## 2017-06-13 MED ORDER — INDOMETHACIN 25 MG PO CAPS
25.0000 mg | ORAL_CAPSULE | Freq: Three times a day (TID) | ORAL | Status: DC | PRN
  Administered 2017-06-13 – 2017-06-14 (×2): 25 mg via ORAL
  Filled 2017-06-13 (×3): qty 1

## 2017-06-13 MED ORDER — SODIUM CHLORIDE 0.9 % IV SOLN
1.0000 g | INTRAVENOUS | Status: DC
  Administered 2017-06-13: 1 g via INTRAVENOUS
  Filled 2017-06-13: qty 1

## 2017-06-13 MED ORDER — GADOBENATE DIMEGLUMINE 529 MG/ML IV SOLN: 20.0000 mL | Freq: Once | INTRAVENOUS | Status: DC | PRN

## 2017-06-13 MED ORDER — ENOXAPARIN SODIUM 40 MG/0.4ML ~~LOC~~ SOLN
40.0000 mg | SUBCUTANEOUS | Status: DC
  Administered 2017-06-13: 40 mg via SUBCUTANEOUS
  Filled 2017-06-13: qty 0.4

## 2017-06-13 MED ORDER — DOCUSATE SODIUM 100 MG PO CAPS
100.0000 mg | ORAL_CAPSULE | Freq: Two times a day (BID) | ORAL | Status: DC
  Administered 2017-06-14: 100 mg via ORAL
  Filled 2017-06-13 (×2): qty 1

## 2017-06-13 MED ORDER — ONDANSETRON HCL 4 MG PO TABS: 4.0000 mg | ORAL_TABLET | Freq: Four times a day (QID) | ORAL | Status: DC | PRN

## 2017-06-13 MED ORDER — ACETAMINOPHEN 500 MG PO TABS
1000.0000 mg | ORAL_TABLET | Freq: Four times a day (QID) | ORAL | Status: DC | PRN
  Administered 2017-06-14: 1000 mg via ORAL
  Filled 2017-06-13: qty 2

## 2017-06-13 NOTE — H&P (Signed)
History and Physical    Carlos Short. WUJ:[id number] DOB: 1950-05-22 DOA: 06/13/2017  Referring MD/NP/PA: Shaune Short PCP: Carlos Banner, MD   Patient coming from: home  Chief Complaint: back pain, confusion  HPI: Carlos Short. is a 67 y.o. male with history of mood disorder, chronic pain who presents with back pain and increased confusion.  According to the patient's wife, the patient had a back injury and has had chronic back pain since that time.  He manages it with Flexeril.  He states that he takes his Flexeril and his tramadol regularly but has not ever increased his dose or taking it any other way than as prescribed.  He feels that his back pain had been somewhat worse over the last few days.  He felt fine over the weekend but has noticed some increased urinary frequency and urgency and has not been able to empty his bladder all the way.  He is not entirely compliant with his medication for BPH.  This morning, his wife noticed that he was slurring his words and using garbled language and not acting like himself.  She was worried he was having a stroke and they came to the emergency department.  He denied any focal weakness of an arm or leg.  He denied numbness.  ED Course: Vital signs notable for tachycardia to 120 when he first arrived in the ER, however heart rate trended down to the 80s with initiation of IV fluids and antibiotics.  His blood pressure remained stable.  Temperature 101.30F labs: White blood cell count 8, lactic acid 0.93.  Urinalysis with large leukocyte esterase, positive nitrites, few bacteria, too numerous to count WBC.  Head CT was normal.  MRI of the brain demonstrated atrophy with mild chronic microvascular ischemia but no acute abnormalities.  MRI of the lumbar spine again showed a compression fracture at L1 with near complete loss of height centrally and anteriorly and an L2 inferior endplate fracture, without spinal cord compression or stenosis.   There was no evidence of epidural abscess.  The patient was given antibiotics empirically due to his fever and tachycardia according to the sepsis protocol.  His mentation and speech were already improving in the emergency department.  Review of Systems:  General: Positive fevers, chills, denies weight change HEENT:  Denies changes to hearing and vision, sinus congestion, sore throat.  Positive headache CV:  Denies chest pain, palpitations, lower extremity edema.  PULM:  Denies SOB, cough.   GI:  Denies nausea, vomiting, constipation, diarrhea.   GU: Positive dysuria, frequency, urgency ENDO:  Denies polyuria, polydipsia.   HEME:  Denies blood in stools, abnormal bruising or bleeding.  LYMPH:  Denies lymphadenopathy.   MSK:  Denies arthralgias, myalgias.   DERM:  Denies skin rash or ulcer.   NEURO:  Denies new focal numbness or weakness.   PSYCH:  Denies anxiety and depression.    Past Medical History:  Diagnosis Date  . Abnormal weight gain   . Anxiety   . Depression   . Hyperlipidemia   . Hypertrophy of prostate with urinary obstruction and other lower urinary tract symptoms (LUTS)   . Insomnia, unspecified   . Mood disorder in conditions classified elsewhere   . Other chronic pain    neuropathy pain rt foot post op hip surg  . Wears glasses   . Wears partial dentures    top and bottom partial    Past Surgical History:  Procedure Laterality Date  .  INGUINAL HERNIA REPAIR Bilateral 08/16/2012   Procedure: LAPAROSCOPIC BILATERAL INGUINAL HERNIA REPAIR;  Surgeon: Wilmon Arms. Corliss Skains, MD;  Location: [facility];  Service: General;  Laterality: Bilateral;  . INSERTION OF MESH Bilateral 08/16/2012   Procedure: INSERTION OF MESH;  Surgeon: Wilmon Arms. Corliss Skains, MD;  Location: [facility];  Service: General;  Laterality: Bilateral;  . MULTIPLE TOOTH EXTRACTIONS    . TONSILLECTOMY    . TOTAL HIP REVISION Right 2005  . UPPER GI ENDOSCOPY       reports that he has  quit smoking. His smoking use included cigarettes. He smoked 0.50 packs per day. He has never used smokeless tobacco. He reports that he drank alcohol. He reports that he does not use drugs.  No Known Allergies  History reviewed. No pertinent family history.  Prior to Admission medications   Medication Sig Start Date End Date Taking? Authorizing Provider  clonazePAM (KLONOPIN) 0.5 MG tablet Take 0.25-0.5 mg by mouth 2 (two) times daily as needed for anxiety. 05/03/17  Yes [provider]  cyclobenzaprine (FLEXERIL) 5 MG tablet Take 5 mg by mouth 3 (three) times daily as needed for muscle spasms. 06/03/17  Yes [provider]  doxazosin (CARDURA) 8 MG tablet Take 16 mg by mouth 2 (two) times daily.  05/03/17  Yes [provider]  gabapentin (NEURONTIN) 300 MG capsule Take 300 mg by mouth 3 (three) times daily.   Yes [provider]  sertraline (ZOLOFT) 100 MG tablet Take 150 mg by mouth daily. 05/03/17  Yes [provider]  traMADol (ULTRAM) 50 MG tablet Take 50 mg by mouth every 6 (six) hours as needed for pain.   Yes [provider]    Physical Exam: Vitals:   06/13/17 1417 06/13/17 1500 06/13/17 1554 06/13/17 1604  BP: (!) 127/96 113/81 124/79 124/79  Pulse: 84 77 72 72  Resp: 18 16 18 18   Temp: 99.6 F (37.6 C)  98 F (36.7 C) 98 F (36.7 C)  TempSrc: Oral  Oral Oral  SpO2: 94% 93% 96%   Weight:    93 kg (205 lb)  Height:    5' 10.51" (1.791 m)    Constitutional:  NAD, calm, comfortable Eyes: PERRL, lids and conjunctivae normal ENMT:  Moist mucous membranes.  Oropharynx nonerythematous, no exudates.   Neck:  No nuchal rigidity, no masses Respiratory:  No wheezes, rales, or rhonchi Cardiovascular: Regular rate and rhythm, no murmurs / rubs / gallops.  2+ radial pulses. Abdomen:  Normal active bowel sounds, soft, nondistended, nontender Musculoskeletal: Normal muscle tone and bulk.  No contractures.  Tender to palpation over  the L1 and L2 spinous processes.  Paraspinous muscles in this region are nontender.  No CVA tenderness. Skin:  no rashes, abrasions, or ulcers Neurologic:  CN 2-12 grossly intact. Sensation intact to light touch, strength 5/5 throughout Psychiatric:  Alert and oriented x 3. Normal affect.    Labs on Admission: I have personally reviewed following labs and imaging studies  CBC: Recent Labs  Lab 06/13/17 1012  WBC 8.0  NEUTROABS 6.9  HGB 15.8  HCT 44.7  MCV 90.7  PLT 148*   Basic Metabolic Panel: Recent Labs  Lab 06/13/17 1012  NA 136  K 3.6  CL 105  CO2 22  GLUCOSE 106*  BUN 17  CREATININE 1.11  CALCIUM 8.8*   GFR: Estimated Creatinine Clearance: 75.6 mL/min (by C-G formula based on SCr of 1.11 mg/dL). Liver Function Tests: No results for  input(s): AST, ALT, ALKPHOS, BILITOT, PROT, ALBUMIN in the last 168 hours. No results for input(s): LIPASE, AMYLASE in the last 168 hours. No results for input(s): AMMONIA in the last 168 hours. Coagulation Profile: No results for input(s): INR, PROTIME in the last 168 hours. Cardiac Enzymes: No results for input(s): CKTOTAL, CKMB, CKMBINDEX, TROPONINI in the last 168 hours. BNP (last 3 results) No results for input(s): PROBNP in the last 8760 hours. HbA1C: No results for input(s): HGBA1C in the last 72 hours. CBG: No results for input(s): GLUCAP in the last 168 hours. Lipid Profile: No results for input(s): CHOL, HDL, LDLCALC, TRIG, CHOLHDL, LDLDIRECT in the last 72 hours. Thyroid Function Tests: No results for input(s): TSH, T4TOTAL, FREET4, T3FREE, THYROIDAB in the last 72 hours. Anemia Panel: No results for input(s): VITAMINB12, FOLATE, FERRITIN, TIBC, IRON, RETICCTPCT in the last 72 hours. Urine analysis:    Component Value Date/Time   COLORURINE YELLOW 06/13/2017 0844   APPEARANCEUR CLOUDY (A) 06/13/2017 0844   LABSPEC 1.015 06/13/2017 0844   PHURINE 8.0 06/13/2017 0844   GLUCOSEU NEGATIVE 06/13/2017 0844   HGBUR  NEGATIVE 06/13/2017 0844   BILIRUBINUR NEGATIVE 06/13/2017 0844   KETONESUR NEGATIVE 06/13/2017 0844   PROTEINUR NEGATIVE 06/13/2017 0844   NITRITE POSITIVE (A) 06/13/2017 0844   LEUKOCYTESUR LARGE (A) 06/13/2017 0844   Sepsis Labs: @LABRCNTIP (procalcitonin:4,lacticidven:4) )No results found for this or any previous visit (from the past 240 hour(s)).   Radiological Exams on Admission: Ct Head Wo Contrast  Result Date: 06/13/2017 CLINICAL DATA:  Headache.  Recent episode of slurred speech EXAM: CT HEAD WITHOUT CONTRAST TECHNIQUE: Contiguous axial images were obtained from the base of the skull through the vertex without intravenous contrast. COMPARISON:  None. FINDINGS: Brain: There is mild diffuse atrophy. There is no intracranial mass, hemorrhage, extra-axial fluid collection, or midline shift. There is mild small vessel disease in the centra semiovale bilaterally. Elsewhere gray-white compartments appear normal. No evident acute infarct. Vascular: No hyperdense vessel. There is calcification in each carotid siphon. Skull: The bony calvarium appears intact. Sinuses/Orbits: There are air-fluid levels in each maxillary antrum, larger on the left than on the right. There are air-fluid levels in each sphenoid sinus. There is opacification of most of the ethmoid air cells bilaterally. There are small air-fluid levels in each inferior frontal sinus. Orbits appear symmetric bilaterally. Other: Mastoid air cells are clear. IMPRESSION: Atrophy with mild periventricular small vessel disease. No evident acute infarct. No mass or hemorrhage. Foci of arterial vascular calcification noted. There is extensive multifocal paranasal sinus disease. Electronically Signed   By: Bretta Bang III M.D.   On: 06/13/2017 10:46   Mr Brain Wo Contrast  Result Date: 06/13/2017 CLINICAL DATA:  Altered level of consciousness. EXAM: MRI HEAD WITHOUT CONTRAST TECHNIQUE: Multiplanar, multiecho pulse sequences of the brain and  surrounding structures were obtained without intravenous contrast. COMPARISON:  CT head 06/13/2017 FINDINGS: Brain: Mild atrophy. Negative for hydrocephalus. Negative for acute infarct. Mild chronic white matter changes. Mild hyperintensity in the pons. Negative for hemorrhage or mass. No fluid collection or midline shift. Vascular: Normal arterial flow voids Skull and upper cervical spine: Negative Sinuses/Orbits: Mucosal edema throughout the paranasal sinuses with air-fluid levels in the maxillary and sphenoid sinus. Negative orbit. Other: None IMPRESSION: Atrophy and mild chronic microvascular ischemia. No acute abnormality Sinusitis with air-fluid levels. Electronically Signed   By: Marlan Palau M.D.   On: 06/13/2017 13:55   Mr Lumbar Spine Wo Contrast  Result Date: 06/13/2017 CLINICAL DATA:  Chronic and worsening back pain. Fevers. Previous fractures. Patient refused contrast administration. EXAM: MRI LUMBAR SPINE WITHOUT CONTRAST TECHNIQUE: Multiplanar, multisequence MR imaging of the lumbar spine was performed. No intravenous contrast was administered. COMPARISON:  05/25/2017. FINDINGS: Segmentation:  5 lumbar type vertebral bodies. Alignment:  Normal Vertebrae: As seen previously. Remote compression fracture at L1 with near complete loss of height centrally. Posterior bowing of the posterosuperior margin of the vertebral body by 5 mm indents the thecal sac but does not cause neural compression. Mild residual edema centrally. Previously seen inferior endplate fracture at L2 with loss of height centrally of 1/3. Pattern of marrow edema appears the same. No progression. Other vertebral levels appear negative. Conus medullaris and cauda equina: Conus extends to the L2 level. Conus and cauda equina appear normal. Paraspinal and other soft tissues: Parapelvic renal cyst on the left. Infrarenal abdominal aortic aneurysm with maximal diameter 4.2 cm. No sign of retroperitoneal bleeding. Disc levels: Mild disc  bulges and facet hypertrophy at L3-4 and above but without compressive stenosis. L4-5: Moderate disc bulge. Mild facet hypertrophy. Mild stenosis of both lateral recesses. L5-S1: Mild disc bulge. Mild facet hypertrophy. No compressive stenosis. IMPRESSION: No significant change since the study of 3 weeks ago. Compression fracture at L1 with near complete loss of height centrally and anteriorly. Posterior bowing of the posterosuperior margin of the vertebral body by 5 mm but without apparent compressive effect upon the neural structures. More recent inferior endplate fracture at L2 with loss of height of 1/3. No progression. Marrow pattern appears the same. Ordinary lower lumbar disc bulges and facet degeneration without likely significant stenosis. The findings could certainly relate to back pain. Infrarenal abdominal aortic aneurysm with maximal transverse diameter of 4.2 cm. Recommend followup by ultrasound in 1 year. This recommendation follows ACR consensus guidelines: White Paper of the ACR Incidental Findings Committee II on Vascular Findings. J Am Coll Radiol 2013; 10:789-794. Electronically Signed   By: Paulina FusiMark  Shogry M.D.   On: 06/13/2017 14:15    EKG: Independently reviewed. Sinus tachycardia and possible old anteroseptal infarct  Assessment/Plan Principal Problem:   Sepsis (HCC) Active Problems:   Chronic pain syndrome   Closed compression fracture of L1 vertebra (HCC)   Acute lower UTI   Acute metabolic encephalopathy   Mood disorder (HCC)   Sepsis (fever, tachycardia, altered mentation) secondary to urinary tract infection -  Follow-up blood and urine cultures -  Start ceftriaxone -  Doubt serotonin syndrome but will hold tramadol   Acute metabolic encephalopathy secondary to sepsis/UTI -  Improving with tx as above  Headache, hx of migraine  -  NSAIDS, tylenol -  Add prochlorperazine  Mood disorder, depression and anxiety, patient on multiple sedating medications - patient  states he has stopped gabapentin and very rarely takes clonazepam (both have been held) - cotninue sertraline, cyclobenzaprine  Lumbar compression fracture L1 and endplate of L2, back pain is CHRONIC -  Tylenol -  D/c tramadol and start indomethacin (patient declined aleve, mobic) -  Avoid narcotics  AAA, 4.2cm -  Repeat US in 1 year  BPH -  Continue doxazosin   DVT prophylaxis: lovenox  Code Status: full code Family Communication:  Patient and wife Disposition Plan: likely to home tomorrow if continuing to clinically improve  Consults called: none  Admission status: observation overnight and likely discharge to home tomorrow on oral antibiotics if speech back to baseline, feeling better.     Renae FickleMackenzie Ronae Noell MD [facility] Pager [phone]  If  7PM-7AM, please contact night-coverage [email or web address] Password [facility]  06/13/2017, 6:44 PM

## 2017-06-13 NOTE — Progress Notes (Signed)
Pt came down to MRI, pt allowed us to scan his brain and L-spine without contrast. When it came time to give contrast for L-spine, he refused anymore imaging. He stated "this is tortuous and I will not do anymore". Pt was informed this is very important and only then did he allow up to finish his pre-contrast exam L-spine.

## 2017-06-13 NOTE — ED Provider Notes (Addendum)
[facility]-EMERGENCY DEPT Provider Note   CSN: [id number] Arrival date & time: 06/13/17  0816     History   Chief Complaint Chief Complaint  Patient presents with  . Back Pain    HPI Carlos Dimarco. is a 67 y.o. male.  HPI 67 year old male with past medical history as below including chronic back pain here with worsening back pain and now altered mental status.  Last known normal was yesterday.  Patient reportedly was normal throughout the day.  Since waking up this morning, however, patient has been very confused.  According to the wife's report, he came into her room at around 5:00 this morning and was confused and "not making sense."  He went back to his room then began yelling for help.  He was found grabbing his back with reported pain.  He was also trying to state his symptoms but had difficulty doing so.  Since waking up, he has had difficulty remembering what he is saying and conveying his symptoms.  He said frustration due to this.  He also endorses worsening, severe, 10 out of 10, aching and throbbing lower back pain.  He had subjective fever and temperature 99.9 at home.  No alleviating factors.  No aggravating factors.  Denies any new lower extremity weakness or numbness.  Denies any recent fevers or chills prior to today.  There is medication changes. He does report blurred vision.  Past Medical History:  Diagnosis Date  . Abnormal weight gain   . Anxiety   . Depression   . Hyperlipidemia   . Hypertrophy of prostate with urinary obstruction and other lower urinary tract symptoms (LUTS)   . Insomnia, unspecified   . Mood disorder in conditions classified elsewhere   . Other chronic pain    neuropathy pain rt foot post op hip surg  . Wears glasses   . Wears partial dentures    top and bottom partial    Patient Active Problem List   Diagnosis Date Noted  . Chronic low back pain without sciatica 04/11/2017  . Encounter for Medicare annual  wellness exam 04/11/2017  . Restless leg syndrome 04/11/2017  . Closed compression fracture of L1 vertebra (HCC) 10/06/2016  . Benign prostatic hyperplasia with weak urinary stream 04/13/2016  . Chronic pain syndrome 04/13/2016  . Acute gastritis without hemorrhage 11/09/2015  . Situational mixed anxiety and depressive disorder 06/08/2015  . History of inguinal hernia 04/26/2015  . History of kidney stones 04/26/2015  . Bilateral inguinal hernia 07/23/2012    Past Surgical History:  Procedure Laterality Date  . INGUINAL HERNIA REPAIR Bilateral 08/16/2012   Procedure: LAPAROSCOPIC BILATERAL INGUINAL HERNIA REPAIR;  Surgeon: Wilmon Arms. Corliss Skains, MD;  Location: [facility];  Service: General;  Laterality: Bilateral;  . INSERTION OF MESH Bilateral 08/16/2012   Procedure: INSERTION OF MESH;  Surgeon: Wilmon Arms. Corliss Skains, MD;  Location: [facility];  Service: General;  Laterality: Bilateral;  . MULTIPLE TOOTH EXTRACTIONS    . TONSILLECTOMY    . TOTAL HIP REVISION Right 2005  . UPPER GI ENDOSCOPY          Home Medications    Prior to Admission medications   Medication Sig Start Date End Date Taking? Authorizing Provider  clonazePAM (KLONOPIN) 0.5 MG tablet Take 0.25-0.5 mg by mouth 2 (two) times daily as needed for anxiety. 05/03/17  Yes [provider]  cyclobenzaprine (FLEXERIL) 5 MG tablet Take 5 mg by mouth 3 (three) times daily  as needed for muscle spasms. 06/03/17  Yes [provider]  doxazosin (CARDURA) 8 MG tablet Take 16 mg by mouth 2 (two) times daily.  05/03/17  Yes [provider]  gabapentin (NEURONTIN) 300 MG capsule Take 300 mg by mouth 3 (three) times daily.   Yes [provider]  sertraline (ZOLOFT) 100 MG tablet Take 150 mg by mouth daily. 05/03/17  Yes [provider]  traMADol (ULTRAM) 50 MG tablet Take 50 mg by mouth every 6 (six) hours as needed for pain.   Yes [provider]    Family  History History reviewed. No pertinent family history.  Social History Social History   Tobacco Use  . Smoking status: Former Smoker    Packs/day: 0.50    Types: Cigarettes  . Smokeless tobacco: Never Used  Substance Use Topics  . Alcohol use: Not Currently    Comment: 29 yr  . Drug use: No     Allergies   Patient has no known allergies.   Review of Systems Review of Systems  Constitutional: Positive for chills and fatigue. Negative for fever.  HENT: Negative for congestion and rhinorrhea.   Eyes: Negative for visual disturbance.  Respiratory: Negative for cough, shortness of breath and wheezing.   Cardiovascular: Negative for chest pain and leg swelling.  Gastrointestinal: Negative for abdominal pain, diarrhea, nausea and vomiting.  Genitourinary: Negative for dysuria and flank pain.  Musculoskeletal: Positive for back pain. Negative for neck pain and neck stiffness.  Skin: Negative for rash and wound.  Allergic/Immunologic: Negative for immunocompromised state.  Neurological: Positive for speech difficulty. Negative for syncope, weakness and headaches.  Psychiatric/Behavioral: Positive for confusion.  All other systems reviewed and are negative.    Physical Exam Updated Vital Signs BP 125/80   Pulse 93   Temp (!) 101.6 F (38.7 C) (Rectal)   Resp (!) 29   Ht 5' 10.5" (1.791 m)   SpO2 94%   BMI 28.83 kg/m   Physical Exam  Constitutional: He is oriented to person, place, and time. He appears well-developed and well-nourished. No distress.  HENT:  Head: Normocephalic and atraumatic.  Eyes: Conjunctivae are normal.  Neck: Neck supple.  Cardiovascular: Normal rate, regular rhythm and normal heart sounds. Exam reveals no friction rub.  No murmur heard. Pulmonary/Chest: Effort normal and breath sounds normal. No respiratory distress. He has no wheezes. He has no rales.  Abdominal: He exhibits no distension.  No pulsatile abd masses.  Musculoskeletal: He  exhibits no edema.  Exquisite TTP over midline upper and mid lower lumbar spine, with palpable deformity. No overlying skin changes.  Neurological: He is alert and oriented to person, place, and time. He exhibits normal muscle tone.  Skin: Skin is warm. Capillary refill takes less than 2 seconds.  Psychiatric: He has a normal mood and affect.  Nursing note and vitals reviewed.   Neurological Exam:  Mental Status: Alert and oriented to person, place, and time. Attention and concentration normal. Speech clear but impaired word recall and trouble expressing thoughts/symptoms. Recent memory is intact. Cranial Nerves: Visual fields grossly intact. EOMI and PERRLA. No nystagmus noted. Facial sensation intact at forehead, maxillary cheek, and chin/mandible bilaterally. No facial asymmetry or weakness. Hearing grossly normal. Uvula is midline, and palate elevates symmetrically. Normal SCM and trapezius strength. Tongue midline without fasciculations. Motor: Muscle strength 5/5 in proximal and distal UE and LE bilaterally. No pronator drift. Muscle tone normal. Reflexes: 2+ and symmetrical in all four extremities.  Sensation: Intact  to light touch in upper and lower extremities distally bilaterally.  Gait: Normal without ataxia. Coordination: Normal FTN bilaterally.    ED Treatments / Results  Labs (all labs ordered are listed, but only abnormal results are displayed) Labs Reviewed  CBC WITH DIFFERENTIAL/PLATELET - Abnormal; Notable for the following components:      Result Value   Platelets 148 (*)    All other components within normal limits  BASIC METABOLIC PANEL - Abnormal; Notable for the following components:   Glucose, Bld 106 (*)    Calcium 8.8 (*)    All other components within normal limits  C-REACTIVE PROTEIN - Abnormal; Notable for the following components:   CRP 1.2 (*)    All other components within normal limits  URINALYSIS, ROUTINE W REFLEX MICROSCOPIC - Abnormal; Notable for  the following components:   APPearance CLOUDY (*)    Nitrite POSITIVE (*)    Leukocytes, UA LARGE (*)    Bacteria, UA FEW (*)    Squamous Epithelial / LPF 6-30 (*)    All other components within normal limits  CULTURE, BLOOD (ROUTINE X 2)  CULTURE, BLOOD (ROUTINE X 2)  URINE CULTURE  SEDIMENTATION RATE  I-STAT CG4 LACTIC ACID, ED  I-STAT CG4 LACTIC ACID, ED    EKG EKG Interpretation  Date/Time:  Wednesday June 13 2017 09:52:46 EDT Ventricular Rate:  106 PR Interval:    QRS Duration: 86 QT Interval:  338 QTC Calculation: 449 R Axis:   -6 Text Interpretation:  Sinus tachycardia Consider right ventricular hypertrophy Probable anteroseptal infarct, old Since last EKG, rate has increased Confirmed by Shaune Pollack [phone]) on 06/13/2017 10:08:14 AM   Radiology Ct Head Wo Contrast  Result Date: 06/13/2017 CLINICAL DATA:  Headache.  Recent episode of slurred speech EXAM: CT HEAD WITHOUT CONTRAST TECHNIQUE: Contiguous axial images were obtained from the base of the skull through the vertex without intravenous contrast. COMPARISON:  None. FINDINGS: Brain: There is mild diffuse atrophy. There is no intracranial mass, hemorrhage, extra-axial fluid collection, or midline shift. There is mild small vessel disease in the centra semiovale bilaterally. Elsewhere gray-white compartments appear normal. No evident acute infarct. Vascular: No hyperdense vessel. There is calcification in each carotid siphon. Skull: The bony calvarium appears intact. Sinuses/Orbits: There are air-fluid levels in each maxillary antrum, larger on the left than on the right. There are air-fluid levels in each sphenoid sinus. There is opacification of most of the ethmoid air cells bilaterally. There are small air-fluid levels in each inferior frontal sinus. Orbits appear symmetric bilaterally. Other: Mastoid air cells are clear. IMPRESSION: Atrophy with mild periventricular small vessel disease. No evident acute infarct. No mass  or hemorrhage. Foci of arterial vascular calcification noted. There is extensive multifocal paranasal sinus disease. Electronically Signed   By: Bretta Bang III M.D.   On: 06/13/2017 10:46    Procedures .Critical Care Performed by: Shaune Pollack, MD Authorized by: Shaune Pollack, MD   Critical care provider statement:    Critical care time (minutes):  35   Critical care time was exclusive of:  Separately billable procedures and treating other patients and teaching time   Critical care was necessary to treat or prevent imminent or life-threatening deterioration of the following conditions:  Sepsis, dehydration and circulatory failure   Critical care was time spent personally by me on the following activities:  Development of treatment plan with patient or surrogate, discussions with consultants, evaluation of patient's response to treatment, examination of patient, obtaining history from patient  or surrogate, ordering and performing treatments and interventions, ordering and review of laboratory studies, ordering and review of radiographic studies, pulse oximetry, re-evaluation of patient's condition and review of old charts   I assumed direction of critical care for this patient from another provider in my specialty: no     (including critical care time)  Medications Ordered in ED Medications  sodium chloride 0.9 % bolus 1,000 mL (0 mLs Intravenous Stopped 06/13/17 1130)  HYDROmorphone (DILAUDID) injection 1 mg (1 mg Intravenous Given 06/13/17 1019)  ondansetron (ZOFRAN) injection 4 mg (4 mg Intravenous Given 06/13/17 1018)  ceFEPIme (MAXIPIME) 2 g in sodium chloride 0.9 % 100 mL IVPB (0 g Intravenous Stopped 06/13/17 1136)  acetaminophen (TYLENOL) tablet 1,000 mg (1,000 mg Oral Given 06/13/17 1101)  sodium chloride 0.9 % bolus 1,000 mL (0 mLs Intravenous Paused 06/13/17 1233)  dexamethasone (DECADRON) injection 10 mg (10 mg Intravenous Given 06/13/17 1101)  vancomycin (VANCOCIN) 2,000 mg in  sodium chloride 0.9 % 500 mL IVPB (0 mg Intravenous Paused 06/13/17 1233)     Initial Impression / Assessment and Plan / ED Course  I have reviewed the triage vital signs and the nursing notes.  Pertinent labs & imaging results that were available during my care of the patient were reviewed by me and considered in my medical decision making (see chart for details).  Clinical Course as of Jun 13 1256  Wed Jun 13, 2017  0848 DDx broad. Regarding his confusion, question of CVA, TIA, also encephalopathy 2/2 infectious or metabolic source. CT, MR ordered. Regarding his pain, in setting of worsening pain, edema on recent MR and now infectious sx, feel it's pertinent to rule out epidural abscess, osteo, or other infectious process. Pt in agreement. IVF, analgesia given. LNK was last night, negative LVO+/VAN scale.   [CI]  0849 No LE weakness, numbness, loss of bowel or bladder, or signs of cauda equina.   [CI]  [id number] Unclear 2/2 pain, occult fever, anxiety. IVF, analgesia given and will check rectal temp.  Pulse Rate(!): 123 [CI]  1001 Sepsis protocol started 2/2 tachycardia, fever, possible discitis/osteo. IV Vanc/Cefepime ordered. Decadron given as well for possible concomitant meningitis, though less likely. Tylenol, fluids given. No signs of severe sepsis at this time.  Temp(!): 101.6 F (38.7 C) [CI]  1015 Possible source - pt has been given broad-spectrum ABX. Cultures sent. However, given worsening back pain, TTP on exam, cannot rule out bacteremia causing seeding of inflamed lumbar fx area.  Urinalysis, Routine w reflex microscopic(!) [CI]  1053 Mentation improving after fluids. Will plan to admit for sepsis, encephalopathy likely 2/2 UTI, but will need MR as well for further evaluation.   [CI]  1252 CT head neg. Mentation improved. HR improving. MRI still pending, will take some time but continue empiric ABX, fluids currently. 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 No signs of rupture - recommend u/s in 1 year.  Final Clinical Impressions(s) / ED Diagnoses   Final diagnoses:  Sepsis due to urinary tract infection [facility])    ED Discharge Orders    None       Shaune Pollack, MD 06/13/17 1258    Shaune Pollack, MD 06/13/17 1434

## 2017-06-13 NOTE — ED Notes (Addendum)
Upon rounding on patient, patient found in room ambulatory without heart monitor and both IV's removed. Patient reports "I can't get the beeping to stop, I haven't been in the hospital for a long time." Patient made aware of call bell in bed and advised to call for help next time. Assisted patient back into bed and will attempt to gain additional IV access. MD made aware.

## 2017-06-13 NOTE — Progress Notes (Signed)
Patient was transferred from ED at 1550. Alert and oriented x4. Family member at bedside. Vital signs was taken.  Paged admitting doctor as ordered at 1553; waiting for phone and orders. Will monitor patient as protocol.

## 2017-06-13 NOTE — ED Notes (Signed)
Hosptalist at bedside.

## 2017-06-13 NOTE — ED Notes (Signed)
Patient transported to CT 

## 2017-06-13 NOTE — ED Notes (Signed)
Pt instructed that he needs a urine specimen for culture. Urinal at bedside.

## 2017-06-13 NOTE — Progress Notes (Signed)
Pharmacy Note   A consult was received from an ED physician for vancomycin 2000 mg IV x1 using weight from 1/30 from outpatient visit.The patient's profile has been reviewed for ht/wt/allergies/indication/available labs.      Further antibiotics/pharmacy consults should be ordered by admitting physician if indicated.                       Thank you,  Adalberto ColeNikola Derrica Sieg, PharmD, BCPS Pager [phone] 06/13/2017 10:31 AM

## 2017-06-13 NOTE — Progress Notes (Signed)
PHARMACY NOTE -  Rocephin  Pharmacy has been assisting with dosing of Rocephin for UTI.  The selected regimen does not require renal adjustment  Pharmacy will sign off, following peripherally for culture results or dose adjustments. Please reconsult if a change in clinical status warrants re-evaluation of dosage.  Bernadene Personrew Schyler Counsell, PharmD, BCPS [phone] 06/13/2017, 4:37 PM

## 2017-06-13 NOTE — ED Notes (Signed)
Patient transported to MRI 

## 2017-06-13 NOTE — ED Notes (Signed)
ED Provider at bedside. 

## 2017-06-13 NOTE — ED Triage Notes (Addendum)
Patient c/o left lower back x 9 months. Today, the pain in the back is worse with movement and ambulation. Patient is alert and oriented x 4. Speech clear.  Patient's wife reports that the patient had a temp 99.9 and was not talking right. Patient's wife checked the patient into registration as a possible stroke.

## 2017-06-13 NOTE — ED Notes (Signed)
ED TO INPATIENT HANDOFF REPORT  Name/Age/Gender Carlos Short 67 y.o. male  Code Status Advance Directive Documentation     Most Recent Value  Type of Advance Directive  Healthcare Power of Attorney  Pre-existing out of facility DNR order (yellow form or pink MOST form)  -  "MOST" Form in Place?  -      Home/SNF/Other Home  Chief Complaint possible stoke   Level of Care/Admitting Diagnosis ED Disposition    ED Disposition Condition [place]: [place] [[id number]]  Level of Care: Med-Surg [16]  Diagnosis: Sepsis [facility]) [[id number]]  Admitting Physician: Janece Canterbury [phone]  Attending Physician: Janece Canterbury [phone]  Estimated length of stay: past midnight tomorrow  Certification:: I certify this patient will need inpatient services for at least 2 midnights  PT Class (Do Not Modify): Inpatient [101]  PT Acc Code (Do Not Modify): Private [1]       Medical History Past Medical History:  Diagnosis Date  . Abnormal weight gain   . Anxiety   . Depression   . Hyperlipidemia   . Hypertrophy of prostate with urinary obstruction and other lower urinary tract symptoms (LUTS)   . Insomnia, unspecified   . Mood disorder in conditions classified elsewhere   . Other chronic pain    neuropathy pain rt foot post op hip surg  . Wears glasses   . Wears partial dentures    top and bottom partial    Allergies No Known Allergies  IV Location/Drains/Wounds Patient Lines/Drains/Airways Status   Active Line/Drains/Airways    Name:   Placement date:   Placement time:   Site:   Days:   Peripheral IV 06/13/17 Right Forearm   06/13/17    1240    Forearm   less than 1   Incision - 3 Ports Abdomen 1: Mid;Upper 2: Mid;Medial 3: Mid;Lower   08/16/12    1300     1762          Labs/Imaging Results for orders placed or performed during the hospital encounter of 06/13/17 (from the past 48 hour(s))  Urinalysis, Routine w reflex  microscopic     Status: Abnormal   Collection Time: 06/13/17  8:44 AM  Result Value Ref Range   Color, Urine YELLOW YELLOW   APPearance CLOUDY (A) CLEAR   Specific Gravity, Urine 1.015 1.005 - 1.030   pH 8.0 5.0 - 8.0   Glucose, UA NEGATIVE NEGATIVE mg/dL   Hgb urine dipstick NEGATIVE NEGATIVE   Bilirubin Urine NEGATIVE NEGATIVE   Ketones, ur NEGATIVE NEGATIVE mg/dL   Protein, ur NEGATIVE NEGATIVE mg/dL   Nitrite POSITIVE (A) NEGATIVE   Leukocytes, UA LARGE (A) NEGATIVE   RBC / HPF 0-5 0 - 5 RBC/hpf   WBC, UA TOO NUMEROUS TO COUNT 0 - 5 WBC/hpf   Bacteria, UA FEW (A) NONE SEEN   Squamous Epithelial / LPF 6-30 (A) NONE SEEN   Mucus PRESENT     Comment: Performed at [facility], [place] [address], [place], [place] [address]  CBC with Differential     Status: Abnormal   Collection Time: 06/13/17 10:12 AM  Result Value Ref Range   WBC 8.0 4.0 - 10.5 K/uL   RBC 4.93 4.22 - 5.81 MIL/uL   Hemoglobin 15.8 13.0 - 17.0 g/dL   HCT 44.7 39.0 - 52.0 %   MCV 90.7 78.0 - 100.0 fL   MCH 32.0 26.0 - 34.0 pg  MCHC 35.3 30.0 - 36.0 g/dL   RDW 13.1 11.5 - 15.5 %   Platelets 148 (L) 150 - 400 K/uL   Neutrophils Relative % 86 %   Neutro Abs 6.9 1.7 - 7.7 K/uL   Lymphocytes Relative 9 %   Lymphs Abs 0.7 0.7 - 4.0 K/uL   Monocytes Relative 4 %   Monocytes Absolute 0.3 0.1 - 1.0 K/uL   Eosinophils Relative 1 %   Eosinophils Absolute 0.1 0.0 - 0.7 K/uL   Basophils Relative 0 %   Basophils Absolute 0.0 0.0 - 0.1 K/uL    Comment: Performed at [facility], [place] [address], [place], [place] [address]  Basic metabolic panel     Status: Abnormal   Collection Time: 06/13/17 10:12 AM  Result Value Ref Range   Sodium 136 135 - 145 mmol/L   Potassium 3.6 3.5 - 5.1 mmol/L   Chloride 105 101 - 111 mmol/L   CO2 22 22 - 32 mmol/L   Glucose, Bld 106 (H) 65 - 99 mg/dL   BUN 17 6 - 20 mg/dL   Creatinine, Ser 1.11 0.61 - 1.24 mg/dL   Calcium 8.8 (L) 8.9 - 10.3 mg/dL    GFR calc non Af Amer >60 >60 mL/min   GFR calc Af Amer >60 >60 mL/min    Comment: (NOTE) The eGFR has been calculated using the CKD EPI equation. This calculation has not been validated in all clinical situations. eGFR's persistently <60 mL/min signify possible Chronic Kidney Disease.    Anion gap 9 5 - 15    Comment: Performed at [facility], [place] [address], [place], [place] [address]  Sedimentation rate     Status: None   Collection Time: 06/13/17 10:12 AM  Result Value Ref Range   Sed Rate 12 0 - 16 mm/hr    Comment: Performed at [facility], [place] [address], [place], [place] [address]  C-reactive protein     Status: Abnormal   Collection Time: 06/13/17 10:12 AM  Result Value Ref Range   CRP 1.2 (H) <1.0 mg/dL    Comment: Performed at [place] [address] [place], [place] [address]  I-Stat CG4 Lactic Acid, ED     Status: None   Collection Time: 06/13/17 10:26 AM  Result Value Ref Range   Lactic Acid, Venous 0.93 0.5 - 1.9 mmol/L   Ct Head Wo Contrast  Result Date: 06/13/2017 CLINICAL DATA:  Headache.  Recent episode of slurred speech EXAM: CT HEAD WITHOUT CONTRAST TECHNIQUE: Contiguous axial images were obtained from the base of the skull through the vertex without intravenous contrast. COMPARISON:  None. FINDINGS: Brain: There is mild diffuse atrophy. There is no intracranial mass, hemorrhage, extra-axial fluid collection, or midline shift. There is mild small vessel disease in the centra semiovale bilaterally. Elsewhere gray-white compartments appear normal. No evident acute infarct. Vascular: No hyperdense vessel. There is calcification in each carotid siphon. Skull: The bony calvarium appears intact. Sinuses/Orbits: There are air-fluid levels in each maxillary antrum, larger on the left than on the right. There are air-fluid levels in each sphenoid sinus. There is opacification of most of the ethmoid air cells bilaterally. There  are small air-fluid levels in each inferior frontal sinus. Orbits appear symmetric bilaterally. Other: Mastoid air cells are clear. IMPRESSION: Atrophy with mild periventricular small vessel disease. No evident acute infarct. No mass or hemorrhage. Foci of arterial vascular calcification noted. There is extensive multifocal paranasal sinus disease.  Electronically Signed   By: Lowella Grip III M.D.   On: 06/13/2017 10:46   Mr Brain Wo Contrast  Result Date: 06/13/2017 CLINICAL DATA:  Altered level of consciousness. EXAM: MRI HEAD WITHOUT CONTRAST TECHNIQUE: Multiplanar, multiecho pulse sequences of the brain and surrounding structures were obtained without intravenous contrast. COMPARISON:  CT head 06/13/2017 FINDINGS: Brain: Mild atrophy. Negative for hydrocephalus. Negative for acute infarct. Mild chronic white matter changes. Mild hyperintensity in the pons. Negative for hemorrhage or mass. No fluid collection or midline shift. Vascular: Normal arterial flow voids Skull and upper cervical spine: Negative Sinuses/Orbits: Mucosal edema throughout the paranasal sinuses with air-fluid levels in the maxillary and sphenoid sinus. Negative orbit. Other: None IMPRESSION: Atrophy and mild chronic microvascular ischemia. No acute abnormality Sinusitis with air-fluid levels. Electronically Signed   By: Franchot Gallo M.D.   On: 06/13/2017 13:55   Mr Lumbar Spine Wo Contrast  Result Date: 06/13/2017 CLINICAL DATA:  Chronic and worsening back pain. Fevers. Previous fractures. Patient refused contrast administration. EXAM: MRI LUMBAR SPINE WITHOUT CONTRAST TECHNIQUE: Multiplanar, multisequence MR imaging of the lumbar spine was performed. No intravenous contrast was administered. COMPARISON:  05/25/2017. FINDINGS: Segmentation:  5 lumbar type vertebral bodies. Alignment:  Normal Vertebrae: As seen previously. Remote compression fracture at L1 with near complete loss of height centrally. Posterior bowing of the  posterosuperior margin of the vertebral body by 5 mm indents the thecal sac but does not cause neural compression. Mild residual edema centrally. Previously seen inferior endplate fracture at L2 with loss of height centrally of 1/3. Pattern of marrow edema appears the same. No progression. Other vertebral levels appear negative. Conus medullaris and cauda equina: Conus extends to the L2 level. Conus and cauda equina appear normal. Paraspinal and other soft tissues: Parapelvic renal cyst on the left. Infrarenal abdominal aortic aneurysm with maximal diameter 4.2 cm. No sign of retroperitoneal bleeding. Disc levels: Mild disc bulges and facet hypertrophy at L3-4 and above but without compressive stenosis. L4-5: Moderate disc bulge. Mild facet hypertrophy. Mild stenosis of both lateral recesses. L5-S1: Mild disc bulge. Mild facet hypertrophy. No compressive stenosis. IMPRESSION: No significant change since the study of 3 weeks ago. Compression fracture at L1 with near complete loss of height centrally and anteriorly. Posterior bowing of the posterosuperior margin of the vertebral body by 5 mm but without apparent compressive effect upon the neural structures. More recent inferior endplate fracture at L2 with loss of height of 1/3. No progression. Marrow pattern appears the same. Ordinary lower lumbar disc bulges and facet degeneration without likely significant stenosis. The findings could certainly relate to back pain. Infrarenal abdominal aortic aneurysm with maximal transverse diameter of 4.2 cm. Recommend followup by ultrasound in 1 year. This recommendation follows ACR consensus guidelines: White Paper of the ACR Incidental Findings Committee II on Vascular Findings. J Am Coll Radiol 2013; 10:789-794. Electronically Signed   By: Nelson Chimes M.D.   On: 06/13/2017 14:15    Pending Labs Unresulted Labs (From admission, onward)   Start     Ordered   06/13/17 1017  Urine culture  STAT,   STAT     06/13/17 1016    06/13/17 0959  Blood Culture (routine x 2)  BLOOD CULTURE X 2,   STAT     06/13/17 1000      Vitals/Pain Today's Vitals   06/13/17 1130 06/13/17 1200 06/13/17 1417 06/13/17 1500  BP: 103/73 125/80 (!) 127/96 113/81  Pulse: 96 93 84 77  Resp: (!)  21 (!) '29 18 16  ' Temp:   99.6 F (37.6 C)   TempSrc:   Oral   SpO2: 95% 94% 94% 93%  Height:      PainSc:        Isolation Precautions No active isolations  Medications Medications  gadobenate dimeglumine (MULTIHANCE) injection 20 mL (has no administration in time range)  sodium chloride 0.9 % bolus 1,000 mL (0 mLs Intravenous Stopped 06/13/17 1130)  HYDROmorphone (DILAUDID) injection 1 mg (1 mg Intravenous Given 06/13/17 1019)  ondansetron (ZOFRAN) injection 4 mg (4 mg Intravenous Given 06/13/17 1018)  ceFEPIme (MAXIPIME) 2 g in sodium chloride 0.9 % 100 mL IVPB (0 g Intravenous Stopped 06/13/17 1136)  acetaminophen (TYLENOL) tablet 1,000 mg (1,000 mg Oral Given 06/13/17 1101)  sodium chloride 0.9 % bolus 1,000 mL ( Intravenous Restarted 06/13/17 1422)  dexamethasone (DECADRON) injection 10 mg (10 mg Intravenous Given 06/13/17 1101)  vancomycin (VANCOCIN) 2,000 mg in sodium chloride 0.9 % 500 mL IVPB ( Intravenous Restarted 06/13/17 1422)    Mobility walks

## 2017-06-14 DIAGNOSIS — A419 Sepsis, unspecified organism: Principal | ICD-10-CM

## 2017-06-14 DIAGNOSIS — G9341 Metabolic encephalopathy: Secondary | ICD-10-CM

## 2017-06-14 DIAGNOSIS — N390 Urinary tract infection, site not specified: Secondary | ICD-10-CM

## 2017-06-14 LAB — URINE CULTURE

## 2017-06-14 LAB — CBC
HCT: 40.2 % (ref 39.0–52.0)
HEMOGLOBIN: 13.6 g/dL (ref 13.0–17.0)
MCH: 30.8 pg (ref 26.0–34.0)
MCHC: 33.8 g/dL (ref 30.0–36.0)
MCV: 91.2 fL (ref 78.0–100.0)
Platelets: 134 10*3/uL — ABNORMAL LOW (ref 150–400)
RBC: 4.41 MIL/uL (ref 4.22–5.81)
RDW: 13.1 % (ref 11.5–15.5)
WBC: 6.5 10*3/uL (ref 4.0–10.5)

## 2017-06-14 LAB — BASIC METABOLIC PANEL
ANION GAP: 8 (ref 5–15)
BUN: 21 mg/dL — AB (ref 6–20)
CO2: 23 mmol/L (ref 22–32)
Calcium: 8.6 mg/dL — ABNORMAL LOW (ref 8.9–10.3)
Chloride: 111 mmol/L (ref 101–111)
Creatinine, Ser: 0.92 mg/dL (ref 0.61–1.24)
GFR calc Af Amer: >60 mL/min (ref >60)
GLUCOSE: 111 mg/dL — AB (ref 65–99)
Potassium: 3.6 mmol/L (ref 3.5–5.1)
Sodium: 142 mmol/L (ref 135–145)

## 2017-06-14 MED ORDER — CEFDINIR 300 MG PO CAPS: 300.0000 mg | ORAL_CAPSULE | Freq: Two times a day (BID) | ORAL | Status: DC

## 2017-06-14 MED ORDER — CEFDINIR 300 MG PO CAPS: 300.0000 mg | ORAL_CAPSULE | Freq: Two times a day (BID) | ORAL | 0 refills | Status: DC

## 2017-06-14 MED ORDER — INDOMETHACIN 25 MG PO CAPS: 25.0000 mg | ORAL_CAPSULE | Freq: Three times a day (TID) | ORAL | 0 refills | Status: DC | PRN

## 2017-06-14 NOTE — Discharge Summary (Signed)
Physician Discharge Summary  [name] VOZ:[id number] DOB: November 17, 1950 DOA: 06/13/2017  PCP: Barbie Banner, MD  Admit date: 06/13/2017 Discharge date: 06/14/2017  Admitted From: Home Disposition: Home  Discharge Condition:Stable CODE STATUS:Full Diet recommendation: Regular  Brief/Interim Summary:  Admission H and P: Carlos Short. is a 67 y.o. male with history of mood disorder, chronic pain who presents with back pain and increased confusion.  According to the patient's wife, the patient had a back injury and has had chronic back pain since that time.  He manages it with Flexeril.  He states that he takes his Flexeril and his tramadol regularly but has not ever increased his dose or taking it any other way than as prescribed.  He feels that his back pain had been somewhat worse over the last few days.  He felt fine over the weekend but has noticed some increased urinary frequency and urgency and has not been able to empty his bladder all the way.  He is not entirely compliant with his medication for BPH.  This morning, his wife noticed that he was slurring his words and using garbled language and not acting like himself.  She was worried he was having a stroke and they came to the emergency department.  He denied any focal weakness of an arm or leg.  He denied numbness.  Hospital Course: Patient was found to be tachycardic on arrival.  But he responded very well with the fluids and antibiotics.  Currently he is afebrile.  His vital signs are stable.  Urinalysis was suggestive of UTI but urine culture grew mixed organisms.  Patient does not have any urinary symptoms at present. He  is very comfortable.  Patient is able to take antibiotics orally so antibiotics changed to oral and he is stable for discharge to home today.  Following problems were addressed during his hospitalization:  Sepsis (fever, tachycardia, altered mentation) secondary to urinary tract infection: -  Urine  culture showed mixed organism. -  Responded to antibiotics.  Antibiotics changed to oral omnicef.  Acute metabolic encephalopathy secondary to sepsis/UTI -  Resolved.  Currently alert and oriented x4  Headache, hx of migraine  -  Much better.Continue home meds  Mood disorder, depression and anxiety - cotninue sertraline, cyclobenzaprine at home   Lumbar compression fracture L1 and endplate of L2 -Back pain is CHRONIC - Follow up with orthopedic/spine specialist as an outpatient.  AAA -4.2cm -  Repeat US in 1 year  BPH -  Continue doxazosin     Discharge Diagnoses:  Principal Problem:   Sepsis (HCC) Active Problems:   Chronic pain syndrome   Closed compression fracture of L1 vertebra (HCC)   Acute lower UTI   Acute metabolic encephalopathy   Mood disorder (HCC)   AAA (abdominal aortic aneurysm) [facility])    Discharge Instructions  Discharge Instructions    Diet general   Complete by:  As directed    Discharge instructions   Complete by:  As directed    1) Please take prescribed medications as instructed. 2) Follow up with your PCP in a week. 3) Do an abdominal Ultrasound  in a year for follow up for abdominal aortic aneurysm. 4) Follow up with Orthopedics/Spine specialists for the management of your back pain.   Increase activity slowly   Complete by:  As directed      Allergies as of 06/14/2017   No Known Allergies     Medication List    TAKE  these medications   cefdinir 300 MG capsule Commonly known as:  OMNICEF Take 1 capsule (300 mg total) by mouth every 12 (twelve) hours.   clonazePAM 0.5 MG tablet Commonly known as:  KLONOPIN Take 0.25-0.5 mg by mouth 2 (two) times daily as needed for anxiety.   cyclobenzaprine 5 MG tablet Commonly known as:  FLEXERIL Take 5 mg by mouth 3 (three) times daily as needed for muscle spasms.   doxazosin 8 MG tablet Commonly known as:  CARDURA Take 16 mg by mouth 2 (two) times daily.   indomethacin 25 MG  capsule Commonly known as:  INDOCIN Take 1 capsule (25 mg total) by mouth 3 (three) times daily as needed for mild pain or moderate pain (Back pain).   sertraline 100 MG tablet Commonly known as:  ZOLOFT Take 150 mg by mouth daily.   traMADol 50 MG tablet Commonly known as:  ULTRAM Take 50 mg by mouth every 6 (six) hours as needed for pain.      Follow-up Information    Barbie Banner, MD. Schedule an appointment as soon as possible for a visit in 1 week(s).   Specialty:  Family Medicine Contact information: [address] [place] [address] [phone]          No Known Allergies  Consultations: None  Procedures/Studies: Ct Head Wo Contrast  Result Date: 06/13/2017 CLINICAL DATA:  Headache.  Recent episode of slurred speech EXAM: CT HEAD WITHOUT CONTRAST TECHNIQUE: Contiguous axial images were obtained from the base of the skull through the vertex without intravenous contrast. COMPARISON:  None. FINDINGS: Brain: There is mild diffuse atrophy. There is no intracranial mass, hemorrhage, extra-axial fluid collection, or midline shift. There is mild small vessel disease in the centra semiovale bilaterally. Elsewhere gray-white compartments appear normal. No evident acute infarct. Vascular: No hyperdense vessel. There is calcification in each carotid siphon. Skull: The bony calvarium appears intact. Sinuses/Orbits: There are air-fluid levels in each maxillary antrum, larger on the left than on the right. There are air-fluid levels in each sphenoid sinus. There is opacification of most of the ethmoid air cells bilaterally. There are small air-fluid levels in each inferior frontal sinus. Orbits appear symmetric bilaterally. Other: Mastoid air cells are clear. IMPRESSION: Atrophy with mild periventricular small vessel disease. No evident acute infarct. No mass or hemorrhage. Foci of arterial vascular calcification noted. There is extensive multifocal paranasal sinus disease.  Electronically Signed   By: Bretta Bang III M.D.   On: 06/13/2017 10:46   Mr Brain Wo Contrast  Result Date: 06/13/2017 CLINICAL DATA:  Altered level of consciousness. EXAM: MRI HEAD WITHOUT CONTRAST TECHNIQUE: Multiplanar, multiecho pulse sequences of the brain and surrounding structures were obtained without intravenous contrast. COMPARISON:  CT head 06/13/2017 FINDINGS: Brain: Mild atrophy. Negative for hydrocephalus. Negative for acute infarct. Mild chronic white matter changes. Mild hyperintensity in the pons. Negative for hemorrhage or mass. No fluid collection or midline shift. Vascular: Normal arterial flow voids Skull and upper cervical spine: Negative Sinuses/Orbits: Mucosal edema throughout the paranasal sinuses with air-fluid levels in the maxillary and sphenoid sinus. Negative orbit. Other: None IMPRESSION: Atrophy and mild chronic microvascular ischemia. No acute abnormality Sinusitis with air-fluid levels. Electronically Signed   By: Marlan Palau M.D.   On: 06/13/2017 13:55   Mr Lumbar Spine Wo Contrast  Result Date: 06/13/2017 CLINICAL DATA:  Chronic and worsening back pain. Fevers. Previous fractures. Patient refused contrast administration. EXAM: MRI LUMBAR SPINE WITHOUT CONTRAST TECHNIQUE: Multiplanar, multisequence  MR imaging of the lumbar spine was performed. No intravenous contrast was administered. COMPARISON:  05/25/2017. FINDINGS: Segmentation:  5 lumbar type vertebral bodies. Alignment:  Normal Vertebrae: As seen previously. Remote compression fracture at L1 with near complete loss of height centrally. Posterior bowing of the posterosuperior margin of the vertebral body by 5 mm indents the thecal sac but does not cause neural compression. Mild residual edema centrally. Previously seen inferior endplate fracture at L2 with loss of height centrally of 1/3. Pattern of marrow edema appears the same. No progression. Other vertebral levels appear negative. Conus medullaris and cauda  equina: Conus extends to the L2 level. Conus and cauda equina appear normal. Paraspinal and other soft tissues: Parapelvic renal cyst on the left. Infrarenal abdominal aortic aneurysm with maximal diameter 4.2 cm. No sign of retroperitoneal bleeding. Disc levels: Mild disc bulges and facet hypertrophy at L3-4 and above but without compressive stenosis. L4-5: Moderate disc bulge. Mild facet hypertrophy. Mild stenosis of both lateral recesses. L5-S1: Mild disc bulge. Mild facet hypertrophy. No compressive stenosis. IMPRESSION: No significant change since the study of 3 weeks ago. Compression fracture at L1 with near complete loss of height centrally and anteriorly. Posterior bowing of the posterosuperior margin of the vertebral body by 5 mm but without apparent compressive effect upon the neural structures. More recent inferior endplate fracture at L2 with loss of height of 1/3. No progression. Marrow pattern appears the same. Ordinary lower lumbar disc bulges and facet degeneration without likely significant stenosis. The findings could certainly relate to back pain. Infrarenal abdominal aortic aneurysm with maximal transverse diameter of 4.2 cm. Recommend followup by ultrasound in 1 year. This recommendation follows ACR consensus guidelines: White Paper of the ACR Incidental Findings Committee II on Vascular Findings. J Am Coll Radiol 2013; 10:789-794. Electronically Signed   By: Paulina Fusi M.D.   On: 06/13/2017 14:15   Mr Lumbar Spine Wo Contrast  Result Date: 05/25/2017 CLINICAL DATA:  Mid low back pain extending into the right hip. Closed compression fracture of L1 lumbar vertebral body. Routine healing. Subsequent encounter. EXAM: MRI LUMBAR SPINE WITHOUT CONTRAST TECHNIQUE: Multiplanar, multisequence MR imaging of the lumbar spine was performed. No intravenous contrast was administered. COMPARISON:  Lumbar spine radiograph 09/04/2016 FINDINGS: Segmentation: 5 non rib-bearing lumbar type vertebral bodies  are present. Alignment: Grade 1 anterolisthesis at T12-L1 is associated with the previous fracture. There is slight retrolisthesis at L2-3. Vertebrae: The L1 superior endplate compression fracture is progressed to a vertebral plana fracture with near complete loss of height in the midline. Retropulsed bone extends 6 mm posterior to the expected plane. A new inferior endplate compression fracture present at L2. There is 40% loss of height adjacent edema. There is no retropulsed bone. A superior endplate Schmorl's node is present at L4. Marrow signal and vertebral body heights are otherwise normal. Conus medullaris and cauda equina: Conus extends to the L1-2 level. Conus and cauda equina appear normal. Paraspinal and other soft tissues: Atherosclerotic changes are noted in the aorta. An inferior central sinus cyst is present in the left kidney. No other focal lesions are present. There is no significant adenopathy. Disc levels: T12-L1: Retropulsed bone partially effaces the ventral CSF without significant central canal stenosis. The foramina are patent. L1-2: Mild facet hypertrophy is present bilaterally without significant stenosis. L2-3: A broad-based disc protrusion is uncovered by the retrolisthesis. Moderate facet hypertrophy is noted bilaterally. The central canal is patent. Mild foraminal narrowing is worse right than left. L3-4: A broad-based  disc protrusion is present. Mild facet hypertrophy is noted bilaterally. This results in mild foraminal narrowing, left greater than right. L4-5: A broad-based disc protrusion is present. Facet hypertrophy contributes to mild subarticular narrowing, right greater than left. Moderate foraminal stenosis is evident bilaterally. L5-S1: A broad-based disc protrusion is present. Mild subarticular narrowing is evident bilaterally. The foramina are patent. IMPRESSION: 1. New acute/subacute inferior endplate compression fracture at L2 with 40% loss of height but no retropulsed  bone. 2. Remote vertebral plana fracture at L1 with retropulsed bone but no significant stenosis. 3. Mild foraminal narrowing bilaterally at L2-3 and L3-4 secondary to broad-based disc protrusions and bilateral facet hypertrophy. 4. Mild subarticular and moderate bilateral foraminal stenosis at L4-5. 5. Mild subarticular narrowing at L5-S1 without significant foraminal disease. Electronically Signed   By: Marin Roberts M.D.   On: 05/25/2017 15:52   Dg Bone Density  Result Date: 05/21/2017 EXAM: DUAL X-RAY ABSORPTIOMETRY (DXA) FOR BONE MINERAL DENSITY IMPRESSION: Referring Physician:  Roger Kill PATIENT: Name: Ormand, Senn Patient ID: [id number] Birth Date: 15-Oct-1950 Height: 69.8 in. Sex: Male Measured: 05/21/2017 Weight: 198.5 lbs. Indications: Caucasian, Height Loss (781.91), Right hip replacement Fractures: None Treatments: None ASSESSMENT: The BMD measured at Femur Neck is 0.727 g/cm2 with a T-score of -2.2. This patient is considered osteopenic according to the World Health Organization [facility]) criteria. 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 Site Region Measured Date Measured Age [age]A BMD Significant CHANGE T-score Left Femur Neck   5/21/[age]         -2.2    0.727 g/cm2 AP Spine   L1-L4  05/21/2017    [age]         -1.5    0.998 g/cm2 World Health Organization [facility]) criteria for post-menopausal, Caucasian Women: Normal       T-score at or above -1 SD Osteopenia   T-score between -1 and -2.5 SD Osteoporosis T-score at or below -2.5 SD RECOMMENDATION: National Osteoporosis Foundation recommends that FDA-approved medical therapies be considered in postmenopausal women and men age 70 or older with a: 1. Hip or vertebral (clinical or morphometric) fracture. 2. T-score of less than or equal to -2.5 at the spine or hip. 3. Ten-year fracture probability by FRAX of 3% or greater for hip fracture or 20% or greater for major osteoporotic fracture. All treatment  decisions require clinical judgment and consideration of individual patient factors, including patient preferences, co-morbidities, previous drug use, risk factors not captured in the FRAX model (e.g. falls, vitamin D deficiency, increased bone turnover, interval significant decline in bone density) and possible under- or over-estimation of fracture risk by FRAX. All patients should ensure an adequate intake of dietary calcium (1200 mg/d) and vitamin D (800 IU daily) unless contraindicated. FOLLOW-UP: People with diagnosed cases of osteoporosis or osteopenia should be regularly tested for bone mineral density. For patients eligible for Medicare, routine testing is allowed once every 2 years. The testing frequency can be increased to one year for patients who have rapidly progressing disease, or for those who are receiving medical therapy to restore bone mass. FRAX* 10-year Probability of Fracture Based on femoral neck BMD: Femur (Left) Major Osteoporotic Fracture: 9.5% Hip Fracture:                2.7% Population:                  [place] (Caucasian) Risk Factors:  None *FRAX is a Armed forces logistics/support/administrative offic[employer]rademark of the [employer] for Metabolic Bone Disease, a World Science writerHealth Organization (WHO) [employer]. ASSESSMENT: The probability of a major osteoporotic fracture is 9.5 % within the next 10 years. The probability of hip fracture is  2.7  % within the next 10 years. Electronically Signed   By: Myles RosenthalJohn  Stahl M.D.   On: 05/21/2017 09:19   (Echo, Carotid, EGD, Colonoscopy, ERCP)    Subjective: Patient seen and examined the bedside this morning.  Remains comfortable.  He is alert and oriented x4.  Afebrile. Stable for discharge to home today.  Discharge Exam: Vitals:   06/13/17 2049 06/14/17 0541  BP: (!) 134/94 (!) 136/92  Pulse: 71 62  Resp: 18 18  Temp: 97.6 F (36.4 C) (!) 97.5 F (36.4 C)  SpO2: 97% 97%   Vitals:   06/13/17 1554 06/13/17 1604 06/13/17 2049  06/14/17 0541  BP: 124/79 124/79 (!) 134/94 (!) 136/92  Pulse: 72 72 71 62  Resp: 18 18 18 18   Temp: 98 F (36.7 C) 98 F (36.7 C) 97.6 F (36.4 C) (!) 97.5 F (36.4 C)  TempSrc: Oral Oral Oral Oral  SpO2: 96%  97% 97%  Weight:  93 kg (205 lb)    Height:  5' 10.51" (1.791 m)      General: Pt is alert, awake, not in acute distress Cardiovascular: RRR, S1/S2 +, no rubs, no gallops Respiratory: CTA bilaterally, no wheezing, no rhonchi Abdominal: Soft, NT, ND, bowel sounds + Extremities: no edema, no cyanosis    The results of significant diagnostics from this hospitalization (including imaging, microbiology, ancillary and laboratory) are listed below for reference.     Microbiology: Recent Results (from the past 240 hour(s))  Blood Culture (routine x 2)     Status: None (Preliminary result)   Collection Time: 06/13/17 10:12 AM  Result Value Ref Range Status   Specimen Description   Final    BLOOD LEFT FOREARM Performed at [facility], [address] [place], [place] [address]    Special Requests   Final    BOTTLES DRAWN AEROBIC AND ANAEROBIC Blood Culture results may not be optimal due to an inadequate volume of blood received in culture bottles Performed at [facility], [address] [place], [place] [address]    Culture   Final    NO GROWTH < 24 HOURS Performed at [facility], [address] [place], [place] [address]    Report Status PENDING  Incomplete  Blood Culture (routine x 2)     Status: None (Preliminary result)   Collection Time: 06/13/17 10:12 AM  Result Value Ref Range Status   Specimen Description   Final    BLOOD LEFT ANTECUBITAL Performed at [facility], [address] [place], [place] [address]    Special Requests   Final    BOTTLES DRAWN AEROBIC AND ANAEROBIC Blood Culture results may not be optimal due to an excessive volume of blood received in culture bottles Performed at  [facility], [address] [place], [place] [address]    Culture   Final    NO GROWTH < 24 HOURS Performed at [facility], [address] [place], [place] [address]    Report Status PENDING  Incomplete  Urine culture     Status: Abnormal   Collection Time: 06/13/17 11:21 AM  Result Value Ref Range Status   Specimen Description  Final    URINE, CLEAN CATCH Performed at [facility], [address], [place], [place] [address]    Special Requests   Final    NONE Performed at [facility], [address], [place], [place] [address]    Culture MULTIPLE SPECIES PRESENT, SUGGEST RECOLLECTION (A)  Final   Report Status 06/14/2017 FINAL  Final     Labs: BNP (last 3 results) No results for input(s): BNP in the last 8760 hours. Basic Metabolic Panel: Recent Labs  Lab 06/13/17 1012 06/14/17 0603  NA 136 142  K 3.6 3.6  CL 105 111  CO2 22 23  GLUCOSE 106* 111*  BUN 17 21*  CREATININE 1.11 0.92  CALCIUM 8.8* 8.6*   Liver Function Tests: No results for input(s): AST, ALT, ALKPHOS, BILITOT, PROT, ALBUMIN in the last 168 hours. No results for input(s): LIPASE, AMYLASE in the last 168 hours. No results for input(s): AMMONIA in the last 168 hours. CBC: Recent Labs  Lab 06/13/17 1012 06/14/17 0603  WBC 8.0 6.5  NEUTROABS 6.9  --   HGB 15.8 13.6  HCT 44.7 40.2  MCV 90.7 91.2  PLT 148* 134*   Cardiac Enzymes: No results for input(s): CKTOTAL, CKMB, CKMBINDEX, TROPONINI in the last 168 hours. BNP: Invalid input(s): POCBNP CBG: No results for input(s): GLUCAP in the last 168 hours. D-Dimer No results for input(s): DDIMER in the last 72 hours. Hgb A1c No results for input(s): HGBA1C in the last 72 hours. Lipid Profile No results for input(s): CHOL, HDL, LDLCALC, TRIG, CHOLHDL, LDLDIRECT in the last 72 hours. Thyroid function studies No results for input(s): TSH, T4TOTAL, T3FREE, THYROIDAB in the last 72  hours.  Invalid input(s): FREET3 Anemia work up No results for input(s): VITAMINB12, FOLATE, FERRITIN, TIBC, IRON, RETICCTPCT in the last 72 hours. Urinalysis    Component Value Date/Time   COLORURINE YELLOW 06/13/2017 0844   APPEARANCEUR CLOUDY (A) 06/13/2017 0844   LABSPEC 1.015 06/13/2017 0844   PHURINE 8.0 06/13/2017 0844   GLUCOSEU NEGATIVE 06/13/2017 0844   HGBUR NEGATIVE 06/13/2017 0844   BILIRUBINUR NEGATIVE 06/13/2017 0844   KETONESUR NEGATIVE 06/13/2017 0844   PROTEINUR NEGATIVE 06/13/2017 0844   NITRITE POSITIVE (A) 06/13/2017 0844   LEUKOCYTESUR LARGE (A) 06/13/2017 0844   Sepsis Labs Invalid input(s): PROCALCITONIN,  WBC,  LACTICIDVEN Microbiology Recent Results (from the past 240 hour(s))  Blood Culture (routine x 2)     Status: None (Preliminary result)   Collection Time: 06/13/17 10:12 AM  Result Value Ref Range Status   Specimen Description   Final    BLOOD LEFT FOREARM Performed at [facility], [address], [place], [place] [address]    Special Requests   Final    BOTTLES DRAWN AEROBIC AND ANAEROBIC Blood Culture results may not be optimal due to an inadequate volume of blood received in culture bottles Performed at [facility] [address] [place], [place] [address]    Culture   Final    NO GROWTH < 24 HOURS Performed at [facility], [address] [place], [place] [address]    Report Status PENDING  Incomplete  Blood Culture (routine x 2)     Status: None (Preliminary result)   Collection Time: 06/13/17 10:12 AM  Result Value Ref Range Status   Specimen Description   Final    BLOOD LEFT ANTECUBITAL Performed at [facility], [address] [place], [place] [address]  Special Requests   Final    BOTTLES DRAWN AEROBIC AND ANAEROBIC Blood Culture results may not be optimal due to an excessive volume of blood received in culture bottles Performed at [facility], [address] [place], [place] [address]    Culture   Final    NO GROWTH < 24 HOURS Performed at [facility], [address] [place], [place] [address]    Report Status PENDING  Incomplete  Urine culture     Status: Abnormal   Collection Time: 06/13/17 11:21 AM  Result Value Ref Range Status   Specimen Description   Final    URINE, CLEAN CATCH Performed at [facility], [address], [place], [place] [address]    Special Requests   Final    NONE Performed at [facility], [address] [place], [place] [address]    Culture MULTIPLE SPECIES PRESENT, SUGGEST RECOLLECTION (A)  Final   Report Status 06/14/2017 FINAL  Final     Time coordinating discharge: Over 30 minutes  SIGNED:   Burnadette Pop, MD  [facility] 06/14/2017, 11:14 AM Pager [phone]  If 7PM-7AM, please contact night-coverage [email or web address] Password [email or web address]

## 2017-06-14 NOTE — Progress Notes (Signed)
Patient has discharged to home on 06/14/17. Discharge instructions including medications and appointments were given to patient, patient has no question at this time. Paged CM for condition code 44.

## 2017-06-18 LAB — CULTURE, BLOOD (ROUTINE X 2)
CULTURE: NO GROWTH
Culture: NO GROWTH

## 2018-01-09 DIAGNOSIS — F5104 Psychophysiologic insomnia: Secondary | ICD-10-CM

## 2018-01-09 HISTORY — DX: Psychophysiologic insomnia: F51.04

## 2018-01-10 DIAGNOSIS — M8589 Other specified disorders of bone density and structure, multiple sites: Secondary | ICD-10-CM

## 2018-01-10 HISTORY — DX: Other specified disorders of bone density and structure, multiple sites: M85.89

## 2018-04-12 DIAGNOSIS — G8929 Other chronic pain: Secondary | ICD-10-CM

## 2018-04-12 HISTORY — DX: Other chronic pain: G89.29

## 2018-09-24 ENCOUNTER — Emergency Department (HOSPITAL_COMMUNITY)
Admission: EM | Admit: 2018-09-24 | Discharge: 2018-09-24 | Disposition: A | Discharge status: Home / Self Care | Payer: Medicare Other | Attending: Emergency Medicine | Admitting: Emergency Medicine

## 2018-09-24 ENCOUNTER — Encounter (HOSPITAL_COMMUNITY): Payer: Self-pay

## 2018-09-24 ENCOUNTER — Emergency Department (HOSPITAL_COMMUNITY): Payer: Medicare Other

## 2018-09-24 ENCOUNTER — Other Ambulatory Visit: Payer: Self-pay

## 2018-09-24 DIAGNOSIS — R410 Disorientation, unspecified: Secondary | ICD-10-CM | POA: Diagnosis not present

## 2018-09-24 DIAGNOSIS — I7143 Infrarenal abdominal aortic aneurysm, without rupture: Secondary | ICD-10-CM

## 2018-09-24 DIAGNOSIS — Z87891 Personal history of nicotine dependence: Secondary | ICD-10-CM | POA: Diagnosis not present

## 2018-09-24 DIAGNOSIS — Z79899 Other long term (current) drug therapy: Secondary | ICD-10-CM | POA: Diagnosis not present

## 2018-09-24 DIAGNOSIS — Z20828 Contact with and (suspected) exposure to other viral communicable diseases: Secondary | ICD-10-CM | POA: Insufficient documentation

## 2018-09-24 DIAGNOSIS — R1084 Generalized abdominal pain: Secondary | ICD-10-CM | POA: Diagnosis present

## 2018-09-24 DIAGNOSIS — N390 Urinary tract infection, site not specified: Secondary | ICD-10-CM | POA: Insufficient documentation

## 2018-09-24 HISTORY — DX: Infrarenal abdominal aortic aneurysm, without rupture: I71.43

## 2018-09-24 LAB — CBC
HCT: 45.6 % (ref 39.0–52.0)
Hemoglobin: 15.2 g/dL (ref 13.0–17.0)
MCH: 31.2 pg (ref 26.0–34.0)
MCHC: 33.3 g/dL (ref 30.0–36.0)
MCV: 93.6 fL (ref 80.0–100.0)
Platelets: 139 10*3/uL — ABNORMAL LOW (ref 150–400)
RBC: 4.87 MIL/uL (ref 4.22–5.81)
RDW: 12.9 % (ref 11.5–15.5)
WBC: 20.2 10*3/uL — ABNORMAL HIGH (ref 4.0–10.5)
nRBC: 0 % (ref 0.0–0.2)

## 2018-09-24 LAB — URINALYSIS, ROUTINE W REFLEX MICROSCOPIC
Bilirubin Urine: NEGATIVE
Glucose, UA: NEGATIVE mg/dL
Hgb urine dipstick: NEGATIVE
Ketones, ur: NEGATIVE mg/dL
Nitrite: NEGATIVE
Protein, ur: NEGATIVE mg/dL
Specific Gravity, Urine: 1.013 (ref 1.005–1.030)
pH: 7 (ref 5.0–8.0)

## 2018-09-24 LAB — LACTIC ACID, PLASMA: Lactic Acid, Venous: 1 mmol/L (ref 0.5–1.9)

## 2018-09-24 LAB — COMPREHENSIVE METABOLIC PANEL
ALT: 14 U/L (ref 0–44)
AST: 15 U/L (ref 15–41)
Albumin: 3.9 g/dL (ref 3.5–5.0)
Alkaline Phosphatase: 56 U/L (ref 38–126)
Anion gap: 12 (ref 5–15)
BUN: 18 mg/dL (ref 8–23)
CO2: 22 mmol/L (ref 22–32)
Calcium: 8.7 mg/dL — ABNORMAL LOW (ref 8.9–10.3)
Chloride: 104 mmol/L (ref 98–111)
Creatinine, Ser: 1.25 mg/dL — ABNORMAL HIGH (ref 0.61–1.24)
GFR calc Af Amer: >60 mL/min (ref >60)
GFR calc non Af Amer: 59 mL/min — ABNORMAL LOW (ref >60)
Glucose, Bld: 119 mg/dL — ABNORMAL HIGH (ref 70–99)
Potassium: 3.8 mmol/L (ref 3.5–5.1)
Sodium: 138 mmol/L (ref 135–145)
Total Bilirubin: 0.7 mg/dL (ref 0.3–1.2)
Total Protein: 7 g/dL (ref 6.5–8.1)

## 2018-09-24 LAB — TROPONIN I (HIGH SENSITIVITY): Troponin I (High Sensitivity): 9 ng/L (ref <18)

## 2018-09-24 LAB — LIPASE, BLOOD: Lipase: 25 U/L (ref 11–51)

## 2018-09-24 LAB — SARS CORONAVIRUS 2 BY RT PCR (HOSPITAL ORDER, PERFORMED IN ~~LOC~~ HOSPITAL LAB): SARS Coronavirus 2: NEGATIVE

## 2018-09-24 MED ORDER — ONDANSETRON HCL 4 MG/2ML IJ SOLN
4.0000 mg | Freq: Once | INTRAMUSCULAR | Status: AC
  Administered 2018-09-24: 4 mg via INTRAVENOUS
  Filled 2018-09-24: qty 2

## 2018-09-24 MED ORDER — SODIUM CHLORIDE 0.9 % IV BOLUS
1000.0000 mL | Freq: Once | INTRAVENOUS | Status: AC
  Administered 2018-09-24: 1000 mL via INTRAVENOUS

## 2018-09-24 MED ORDER — ONDANSETRON HCL 4 MG PO TABS: 4.0000 mg | ORAL_TABLET | Freq: Three times a day (TID) | ORAL | 0 refills | Status: DC | PRN

## 2018-09-24 MED ORDER — SODIUM CHLORIDE (PF) 0.9 % IJ SOLN
INTRAMUSCULAR | Status: AC
  Filled 2018-09-24: qty 50

## 2018-09-24 MED ORDER — CEPHALEXIN 500 MG PO CAPS: 500.0000 mg | ORAL_CAPSULE | Freq: Four times a day (QID) | ORAL | 0 refills | Status: AC

## 2018-09-24 MED ORDER — SODIUM CHLORIDE 0.9% FLUSH
3.0000 mL | Freq: Once | INTRAVENOUS | Status: AC
  Administered 2018-09-24: 3 mL via INTRAVENOUS

## 2018-09-24 MED ORDER — OXYCODONE-ACETAMINOPHEN 5-325 MG PO TABS: 1.0000 | ORAL_TABLET | ORAL | 0 refills | Status: DC | PRN

## 2018-09-24 MED ORDER — CEPHALEXIN 500 MG PO CAPS
500.0000 mg | ORAL_CAPSULE | Freq: Once | ORAL | Status: AC
  Administered 2018-09-24: 500 mg via ORAL
  Filled 2018-09-24: qty 1

## 2018-09-24 MED ORDER — MORPHINE SULFATE (PF) 4 MG/ML IV SOLN
4.0000 mg | Freq: Once | INTRAVENOUS | Status: AC
  Administered 2018-09-24: 4 mg via INTRAVENOUS
  Filled 2018-09-24: qty 1

## 2018-09-24 MED ORDER — IOHEXOL 350 MG/ML SOLN
100.0000 mL | Freq: Once | INTRAVENOUS | Status: AC | PRN
  Administered 2018-09-24: 100 mL via INTRAVENOUS

## 2018-09-24 NOTE — ED Triage Notes (Signed)
Pt reports that he has generalized abdominal pain, with constipation last BM 2-3 days ago, and has not had relief x 3 days. Pt does report an episode of nausea and vomiting yesterday.  Pt reports that "I have been confuse with my thinking" Pt states that he had similar symptoms in the past and was worked up for a stroke and was negative. Pt reports that this symptoms are recurrent and can not say when he began feeling this way. States that he has difficulty finding words. Pt denies any visual change, denies and weakness . Pt is able to ambulate independently.

## 2018-09-24 NOTE — ED Provider Notes (Signed)
[facility]-EMERGENCY DEPT Provider Note   CSN: [id number] Arrival date & time: 09/24/18  1056     History   Chief Complaint Chief Complaint  Patient presents with   Abdominal Pain   Altered Mental Status    HPI Carlos Short. is a 68 y.o. male.     The history is provided by the patient and medical records. No language interpreter was used.  Abdominal Pain Pain location:  Generalized Pain quality: aching, cramping and sharp   Pain radiates to:  Does not radiate Pain severity:  Severe Onset quality:  Gradual Duration:  3 days Timing:  Constant Progression:  Waxing and waning Chronicity:  New Relieved by:  Nothing Worsened by:  Nothing Ineffective treatments:  None tried Associated symptoms: chills, fever, nausea, shortness of breath and vomiting   Associated symptoms: no chest pain, no constipation, no cough, no diarrhea, no dysuria, no fatigue, no flatus and no melena     Past Medical History:  Diagnosis Date   Abnormal weight gain    Anxiety    Depression    Hyperlipidemia    Hypertrophy of prostate with urinary obstruction and other lower urinary tract symptoms (LUTS)    Insomnia, unspecified    Mood disorder in conditions classified elsewhere    Other chronic pain    neuropathy pain rt foot post op hip surg   Wears glasses    Wears partial dentures    top and bottom partial    Patient Active Problem List   Diagnosis Date Noted   Sepsis (HCC) 06/13/2017   Acute lower UTI 06/13/2017   Acute metabolic encephalopathy 06/13/2017   Mood disorder (HCC) 06/13/2017   AAA (abdominal aortic aneurysm) (HCC) 06/13/2017   Chronic low back pain without sciatica 04/11/2017   Encounter for Medicare annual wellness exam 04/11/2017   Restless leg syndrome 04/11/2017   Closed compression fracture of L1 vertebra (HCC) 10/06/2016   Benign prostatic hyperplasia with weak urinary stream 04/13/2016   Chronic pain  syndrome 04/13/2016   Acute gastritis without hemorrhage 11/09/2015   Situational mixed anxiety and depressive disorder 06/08/2015   History of inguinal hernia 04/26/2015   History of kidney stones 04/26/2015   Bilateral inguinal hernia 07/23/2012    Past Surgical History:  Procedure Laterality Date   INGUINAL HERNIA REPAIR Bilateral 08/16/2012   Procedure: LAPAROSCOPIC BILATERAL INGUINAL HERNIA REPAIR;  Surgeon: Wilmon Arms. Corliss Skains, MD;  Location: [facility];  Service: General;  Laterality: Bilateral;   INSERTION OF MESH Bilateral 08/16/2012   Procedure: INSERTION OF MESH;  Surgeon: Wilmon Arms. Corliss Skains, MD;  Location: [facility];  Service: General;  Laterality: Bilateral;   MULTIPLE TOOTH EXTRACTIONS     TONSILLECTOMY     TOTAL HIP REVISION Right 2005   UPPER GI ENDOSCOPY          Home Medications    Prior to Admission medications   Medication Sig Start Date End Date Taking? Authorizing Provider  cefdinir (OMNICEF) 300 MG capsule Take 1 capsule (300 mg total) by mouth every 12 (twelve) hours. 06/14/17   Burnadette Pop, MD  clonazePAM (KLONOPIN) 0.5 MG tablet Take 0.25-0.5 mg by mouth 2 (two) times daily as needed for anxiety. 05/03/17   [provider]  cyclobenzaprine (FLEXERIL) 5 MG tablet Take 5 mg by mouth 3 (three) times daily as needed for muscle spasms. 06/03/17   [provider]  doxazosin (CARDURA) 8 MG tablet Take 16 mg by mouth 2 (two)  times daily.  05/03/17   [provider]  indomethacin (INDOCIN) 25 MG capsule Take 1 capsule (25 mg total) by mouth 3 (three) times daily as needed for mild pain or moderate pain (Back pain). 06/14/17   Shelly Coss, MD  sertraline (ZOLOFT) 100 MG tablet Take 150 mg by mouth daily. 05/03/17   [provider]  traMADol (ULTRAM) 50 MG tablet Take 50 mg by mouth every 6 (six) hours as needed for pain.    [provider]    Family History Family History  Problem  Relation Age of Onset   Migraines Neg Hx    AAA (abdominal aortic aneurysm) Neg Hx     Social History Social History   Tobacco Use   Smoking status: Former Smoker    Packs/day: 0.50    Types: Cigarettes   Smokeless tobacco: Never Used  Substance Use Topics   Alcohol use: Not Currently    Comment: 37 yr   Drug use: No     Allergies   Patient has no known allergies.   Review of Systems Review of Systems  Constitutional: Positive for chills and fever. Negative for diaphoresis and fatigue.  HENT: Negative for congestion and rhinorrhea.   Eyes: Negative for visual disturbance.  Respiratory: Positive for shortness of breath. Negative for cough, chest tightness, wheezing and stridor.   Cardiovascular: Negative for chest pain, palpitations and leg swelling.  Gastrointestinal: Positive for abdominal pain, nausea and vomiting. Negative for abdominal distention, blood in stool, constipation, diarrhea, flatus and melena.  Genitourinary: Negative for dysuria and frequency.  Musculoskeletal: Negative for back pain, neck pain and neck stiffness.  Skin: Negative for rash and wound.  Neurological: Negative for dizziness, seizures, facial asymmetry, weakness, light-headedness, numbness and headaches.  Psychiatric/Behavioral: Negative for agitation.  All other systems reviewed and are negative.    Physical Exam Updated Vital Signs BP 106/76    Pulse 83    Temp 98.3 F (36.8 C) (Oral)    Resp (!) 24    SpO2 96%   Physical Exam Vitals signs and nursing note reviewed.  Constitutional:      General: He is not in acute distress.    Appearance: He is well-developed. He is not ill-appearing, toxic-appearing or diaphoretic.  HENT:     Head: Normocephalic and atraumatic.  Eyes:     Conjunctiva/sclera: Conjunctivae normal.  Neck:     Musculoskeletal: Neck supple.  Cardiovascular:     Rate and Rhythm: Normal rate and regular rhythm.     Heart sounds: Normal heart sounds. No murmur.   Pulmonary:     Effort: Pulmonary effort is normal. No respiratory distress.     Breath sounds: Normal breath sounds. No wheezing, rhonchi or rales.  Chest:     Chest wall: No tenderness.  Abdominal:     General: Bowel sounds are normal.     Palpations: Abdomen is soft.     Tenderness: There is generalized abdominal tenderness. There is no right CVA tenderness or left CVA tenderness.  Skin:    General: Skin is warm and dry.     Capillary Refill: Capillary refill takes less than 2 seconds.  Neurological:     General: No focal deficit present.     Mental Status: He is alert.     GCS: GCS eye subscore is 4. GCS verbal subscore is 5. GCS motor subscore is 6.     Cranial Nerves: No dysarthria or facial asymmetry.     Sensory: No sensory deficit.  Motor: No tremor, abnormal muscle tone or seizure activity.     Coordination: Finger-Nose-Finger Test normal.  Psychiatric:        Mood and Affect: Mood normal.      ED Treatments / Results  Labs (all labs ordered are listed, but only abnormal results are displayed) Labs Reviewed  COMPREHENSIVE METABOLIC PANEL - Abnormal; Notable for the following components:      Result Value   Glucose, Bld 119 (*)    Creatinine, Ser 1.25 (*)    Calcium 8.7 (*)    GFR calc non Af Amer 59 (*)    All other components within normal limits  CBC - Abnormal; Notable for the following components:   WBC 20.2 (*)    Platelets 139 (*)    All other components within normal limits  URINALYSIS, ROUTINE W REFLEX MICROSCOPIC - Abnormal; Notable for the following components:   APPearance CLOUDY (*)    Leukocytes,Ua TRACE (*)    Bacteria, UA RARE (*)    All other components within normal limits  SARS CORONAVIRUS 2 (HOSPITAL ORDER, PERFORMED IN [facility])  URINE CULTURE  LIPASE, BLOOD  LACTIC ACID, PLASMA  TROPONIN I (HIGH SENSITIVITY)  TROPONIN I (HIGH SENSITIVITY)    EKG EKG Interpretation  Date/Time:  Tuesday September 24 2018 11:34:57  EDT Ventricular Rate:  57 PR Interval:    QRS Duration: 92 QT Interval:  509 QTC Calculation: 496 R Axis:     Text Interpretation:  Sinus rhythm Atrial premature complexes Nonspecific T abnormalities, lateral leads Borderline prolonged QT interval When comapred to prior, slightly longer QTc. slower rate.  No STEMI Confirmed by Theda Belfast ([id number]) on 09/24/2018 12:40:55 PM   Radiology Dg Chest Portable 1 View  Result Date: 09/24/2018 CLINICAL DATA:  Fever and shortness of breath EXAM: PORTABLE CHEST 1 VIEW COMPARISON:  None. FINDINGS: The heart size and mediastinal contours are within normal limits. Both lungs are clear. The visualized skeletal structures are unremarkable. IMPRESSION: No active disease. Electronically Signed   By: Alcide Clever M.D.   On: 09/24/2018 13:22   Ct Angio Chest/abd/pel For Dissection W And/or Wo Contrast  Result Date: 09/24/2018 CLINICAL DATA:  Pt reports that he has generalized abdominal pain, with constipation last BM 2-3 days ago, and has not had relief x 3 days. Pt does report an episode of nausea and vomiting yesterday. EXAM: CT ANGIOGRAPHY CHEST, ABDOMEN AND PELVIS TECHNIQUE: Multidetector CT imaging through the chest, abdomen and pelvis was performed using the standard protocol during bolus administration of intravenous contrast. Multiplanar reconstructed images and MIPs were obtained and reviewed to evaluate the vascular anatomy. CONTRAST:  OMNIPAQUE IOHEXOL 350 MG/ML SOLN COMPARISON:  None. FINDINGS: CTA CHEST FINDINGS Cardiovascular: Preferential opacification of the thoracic aorta. No evidence of thoracic aortic aneurysm or dissection. Normal heart size. No pericardial effusion. Mediastinum/Nodes: No enlarged mediastinal, hilar, or axillary lymph nodes. Thyroid gland, trachea, and esophagus demonstrate no significant findings. Lungs/Pleura: Lungs are clear. No pleural effusion or pneumothorax. Musculoskeletal: No chest wall abnormality. No acute or  significant osseous findings. Review of the MIP images confirms the above findings. CTA ABDOMEN AND PELVIS FINDINGS VASCULAR Aorta: Infrarenal abdominal aortic aneurysm measuring 4.2 x 4.2 cm. Abdominal aortic atherosclerosis. No significant stenosis. Celiac: Patent without evidence of aneurysm, dissection, vasculitis or significant stenosis. SMA: Patent without evidence of aneurysm, dissection, vasculitis or significant stenosis. Renals: Both renal arteries are patent without evidence of aneurysm, dissection, vasculitis, fibromuscular dysplasia or significant stenosis. IMA: Patent without  evidence of aneurysm, dissection, vasculitis or significant stenosis. Inflow: Patent without evidence of aneurysm, dissection, vasculitis or significant stenosis. Veins: No obvious venous abnormality within the limitations of this arterial phase study. Review of the MIP images confirms the above findings. NON-VASCULAR Hepatobiliary: No focal liver abnormality is seen. No gallstones, gallbladder wall thickening, or biliary dilatation. Pancreas: Unremarkable. No pancreatic ductal dilatation or surrounding inflammatory changes. Spleen: Normal in size without focal abnormality. Adrenals/Urinary Tract: Normal adrenal glands. Multiple nonobstructing left renal calculi with the largest measuring 15 mm. No hydronephrosis. Severe bladder distension with a 13 mm bladder calculus. Stomach/Bowel: Small hiatal hernia. Stomach is within normal limits. Appendix appears normal. No evidence of bowel wall thickening, distention, or inflammatory changes. 1.2 x 2.1 x 1.5 cm spiculated nodule within the mesentery with mild surrounding inflammatory changes. Lymphatic: No lymphadenopathy. Reproductive: Prostate is unremarkable. Other: No abdominal wall hernia or abnormality. No abdominopelvic ascites. Musculoskeletal: Right hip arthroplasty. No acute fracture or dislocation. No aggressive osseous lesion. Chronic L1 and L2 vertebral body compression  fractures. Review of the MIP images confirms the above findings. IMPRESSION: 1. No aortic dissection. 2. Infrarenal abdominal aortic aneurysm measuring 4.2 x 4.2 cm. Abdominal aortic atherosclerosis. Abdominal Aortic Aneurysm (ICD10-I71.9). Recommend follow-up aortic ultrasound in 1 year. This recommendation follows ACR consensus guidelines: White Paper of the ACR Incidental Findings Committee II on Vascular Findings. J Am Coll Radiol 2013; 16:109-604; 10:789-794. 3.  Aortic Atherosclerosis (ICD10-I70.0). 4. 1.2 x 2.1 x 1.5 cm spiculated nodule within the mesentery with mild surrounding inflammatory changes. This may reflect an area of focal inflammatory adenitis versus a small mesenteric mass such as carcinoid tumor. Recommend follow-up CT of the abdomen/pelvis in 3 months. Electronically Signed   By: Elige KoHetal  Patel   On: 09/24/2018 14:49    Procedures Procedures (including critical care time)  Medications Ordered in ED Medications  sodium chloride (PF) 0.9 % injection (has no administration in time range)  cephALEXin (KEFLEX) capsule 500 mg (has no administration in time range)  sodium chloride flush (NS) 0.9 % injection 3 mL (3 mLs Intravenous Given 09/24/18 1202)  morphine 4 MG/ML injection 4 mg (4 mg Intravenous Given 09/24/18 1322)  sodium chloride 0.9 % bolus 1,000 mL (0 mLs Intravenous Stopped 09/24/18 1530)  ondansetron (ZOFRAN) injection 4 mg (4 mg Intravenous Given 09/24/18 1322)  iohexol (OMNIPAQUE) 350 MG/ML injection 100 mL (100 mLs Intravenous Contrast Given 09/24/18 1412)     Initial Impression / Assessment and Plan / ED Course  I have reviewed the triage vital signs and the nursing notes.  Pertinent labs & imaging results that were available during my care of the patient were reviewed by me and considered in my medical decision making (see chart for details).        Carlos FitzFranklin D Derusha Jr. is a 68 y.o. male with a past medical history significant for prior inguinal hernia status post repair,  prior metabolic encephalopathy related to urinary tract infection, known AAA, BPH, hyperlipidemia, and insomnia who presents with fevers, chills, abdominal pain, nausea, vomiting, constipation/lack of flatus, shortness of breath, and confusion.  Patient reports that his confusion is similar to when he has had metabolic encephalopathy related to prior sepsis and urinary tract infection.  He denies significant cough but reports he had a fever of 101 earlier today.  He reports he took an unknown antipyretic and his temperature improved.  He reports no headache neck pain or neck stiffness.  He denies any traumas.  He reports he is having some  confusion and some word finding difficulty similar to prior.  Previously with the symptoms he did not have stroke.  He reports that his abdominal pain is severe, up to an 8 out of 10 in severity.  He reports has had nausea and vomiting with no blood in his emesis.  He has not been passing gas and not had a bowel movement in 3 or 4 days.  This is different for him.  He denies any urinary symptoms.  Denies any trauma.  He denies other complaints on arrival including no numbness, weakness, tingling of extremities.  No vision changes.  On exam, speech is clear.  No facial droop.  Normal finger-nose-finger testing bilaterally.  Normal grip strength and sensation in extremities.  Lungs clear and chest nontender.  Abdomen was very tender.  No CVA or flank tenderness.  Back nontender.  No focal neurologic deficits on initial exam.  Gait initially deferred due to the severe abdominal pain.  Given patient's known AAA, will obtain CT imaging to rule out dissection, worsened aneurysm, or other intra-abdominal pathology.  With his lack of bowel movement, nausea, vomiting, and abdominal pain, will rule out bowel obstruction as well with imaging.  Will get chest x-ray and urinalysis given his fever and the encephalopathy consistent with prior metabolic encephalopathy due to infection.  With  his lack of neck pain, neck tenderness, neck stiffness, or headaches, low suspicion for meningitis at this time.  He will be given pain medicine, nausea some, fluids and will made n.p.o.  Anticipate reassessment after work-up.  4:34 PM Diagnostic work-up returned showing evidence of likely urinary tract infection given his history of same.  He has leukocytes, white blood cells, and bacteria.  No nitrites present but no mucus either.  He was also cloudy.  Lactic acid was not elevated.  No sepsis present.  Coronavirus test negative.  Patient has a leukocytosis of 20.2 with normal hemoglobin.  Creatinine slightly more elevated at 1.2 from prior.  Troponin negative.  CT scan shows similar aortic aneurysm with no dissection.  Of note, a small spiculated nodule was found in the mesentery and patient was informed of this finding.  And adenitis versus a tumor.  Patient was instructed to follow-up with PCP for repeat CT imaging in 3 months per the radiology recommendations.  Patient reports feeling much better after fluids and is thinking more clearly.  He was given the option of admission for IV antibiotics given the slight altered mental status and likely mild metabolic encephalopathy with UTI however he would rather get a dose of antibiotics and go home for outpatient antibiotic treatment.  His pain and nausea is also resolved after medications, will prescribe a course of pain and nausea medicine with antibiotics.  Patient understands follow-up instructions as well as return precautions for new or worsened symptoms.  He is feeling much better and still wants to go home.  Patient discharged in good condition with improved symptoms.   Final Clinical Impressions(s) / ED Diagnoses   Final diagnoses:  Lower urinary tract infectious disease  Confusion    ED Discharge Orders         Ordered    cephALEXin (KEFLEX) 500 MG capsule  4 times daily     09/24/18 1638    oxyCODONE-acetaminophen  (PERCOCET/ROXICET) 5-325 MG tablet  Every 4 hours PRN     09/24/18 1638    ondansetron (ZOFRAN) 4 MG tablet  Every 8 hours PRN     09/24/18 1638  Clinical Impression: 1. Lower urinary tract infectious disease   2. Confusion     Disposition: Discharge  Condition: Good  I have discussed the results, Dx and Tx plan with the pt(& family if present). He/she/they expressed understanding and agree(s) with the plan. Discharge instructions discussed at great length. Strict return precautions discussed and pt &/or family have verbalized understanding of the instructions. No further questions at time of discharge.    New Prescriptions   CEPHALEXIN (KEFLEX) 500 MG CAPSULE    Take 1 capsule (500 mg total) by mouth 4 (four) times daily for 7 days.   ONDANSETRON (ZOFRAN) 4 MG TABLET    Take 1 tablet (4 mg total) by mouth every 8 (eight) hours as needed for nausea or vomiting.   OXYCODONE-ACETAMINOPHEN (PERCOCET/ROXICET) 5-325 MG TABLET    Take 1 tablet by mouth every 4 (four) hours as needed for severe pain.    Follow Up: No follow-up provider specified.    [place] Monds, Canary Brimhristopher J, MD 09/24/18 2257

## 2018-09-24 NOTE — Discharge Instructions (Signed)
Your work-up today showed evidence of likely urinary tract infection as the cause of your confusion similar to prior.  You are not septic.  The CT scan showed your aneurysm is similar to prior however did show the small nodule we discussed.  Please follow-up with your PCP for repeat imaging in the next 3 months to further monitor.  Please take the antibiotics for the next week as well as the pain and nausea medicine to with your symptoms.  Please push hydration as she felt better after fluids.  If any symptoms change or worsen or your confusion worsens, please return to the nearest emergency department.

## 2018-09-25 LAB — URINE CULTURE: Culture: <10000 — AB

## 2018-10-20 DIAGNOSIS — L219 Seborrheic dermatitis, unspecified: Secondary | ICD-10-CM | POA: Insufficient documentation

## 2018-10-20 HISTORY — DX: Seborrheic dermatitis, unspecified: L21.9

## 2019-02-09 DIAGNOSIS — L821 Other seborrheic keratosis: Secondary | ICD-10-CM

## 2019-02-09 HISTORY — DX: Other seborrheic keratosis: L82.1

## 2019-02-17 DIAGNOSIS — D229 Melanocytic nevi, unspecified: Secondary | ICD-10-CM | POA: Insufficient documentation

## 2019-02-17 HISTORY — DX: Melanocytic nevi, unspecified: D22.9

## 2019-03-28 IMAGING — MR MR LUMBAR SPINE W/O CM
4 of 5 series · 18 of 48 positions shown · IV contrast (Yes)
Comparison: 05/25/2017.

CLINICAL DATA: Chronic and worsening back pain. Fevers. Previous
fractures. Patient refused contrast administration.

EXAM:
MRI LUMBAR SPINE WITHOUT CONTRAST
TECHNIQUE: Multiplanar, multisequence MR imaging of the lumbar spine was
performed. No intravenous contrast was administered.

[Series 3: T1 · sagittal · 4.0mm · 0.51mm/px · 3 of 12 slices shown (1 of 2)]
[im 1/12]
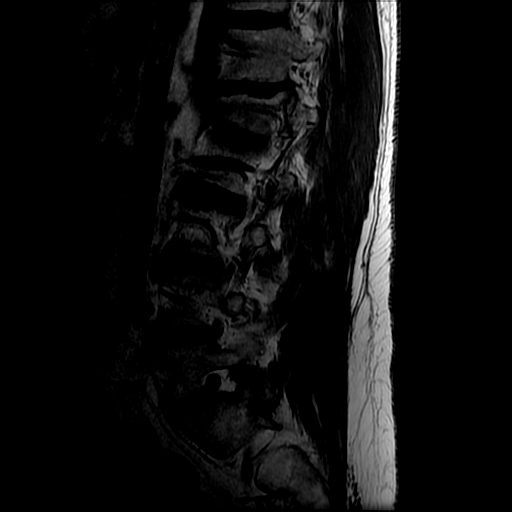
[im 8/12]
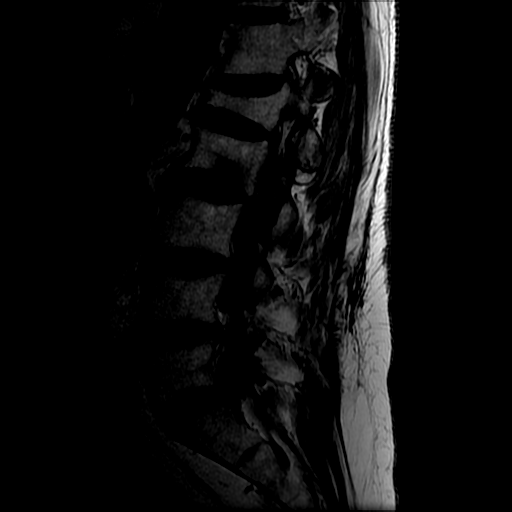
[im 12/12]
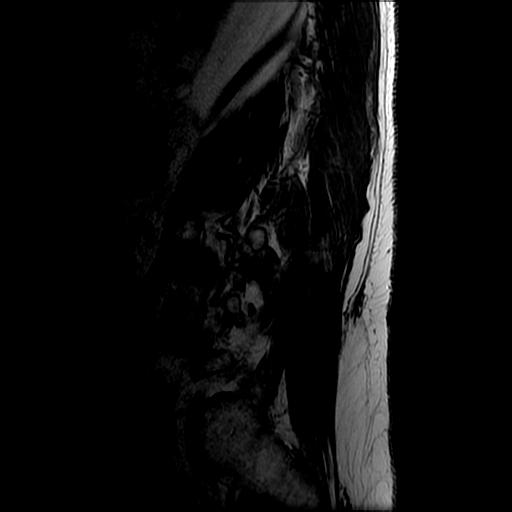

[Series 5: T2 · axial · 4.0mm · 0.39mm/px · z∈[-52,+113]mm · 9 of 42 slices shown (1 of 2)]
[im 3/42]
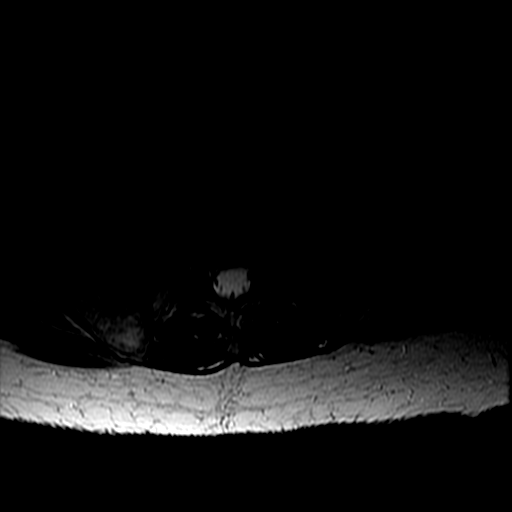
[im 6/42]
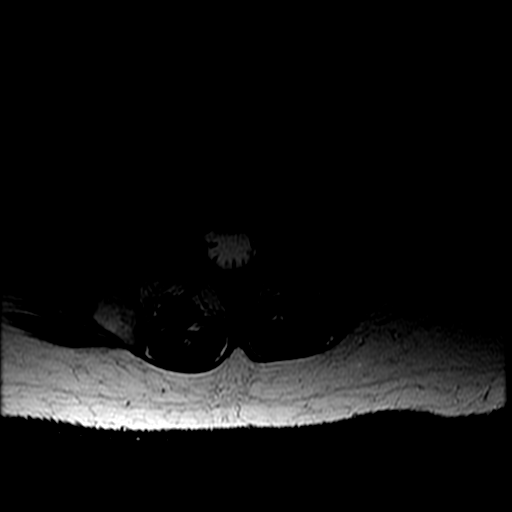
[im 8/42]
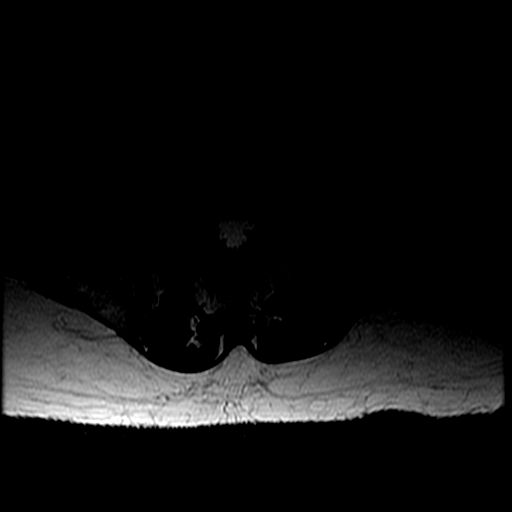
[im 13/42]
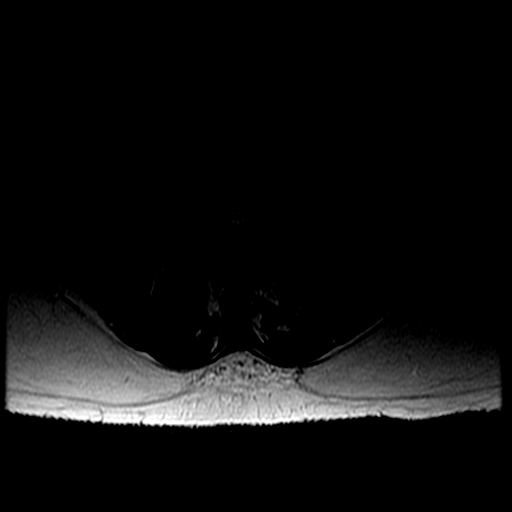
[im 18/42]
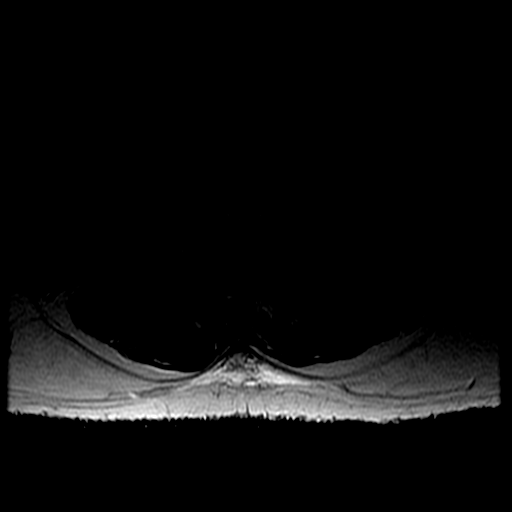
[im 21/42]
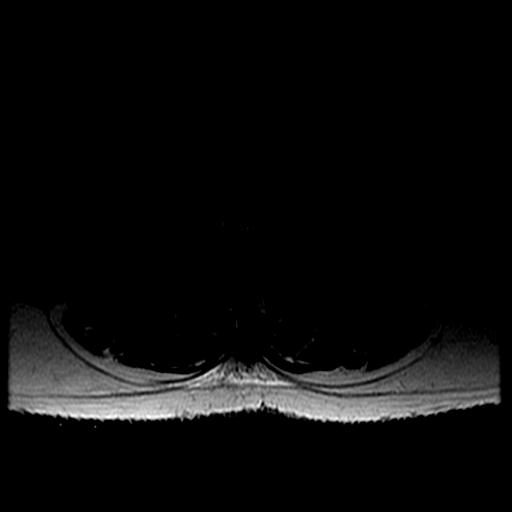
[im 24/42]
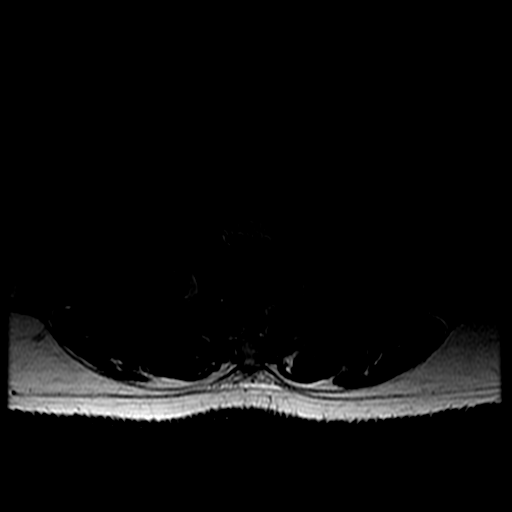
[im 29/42]
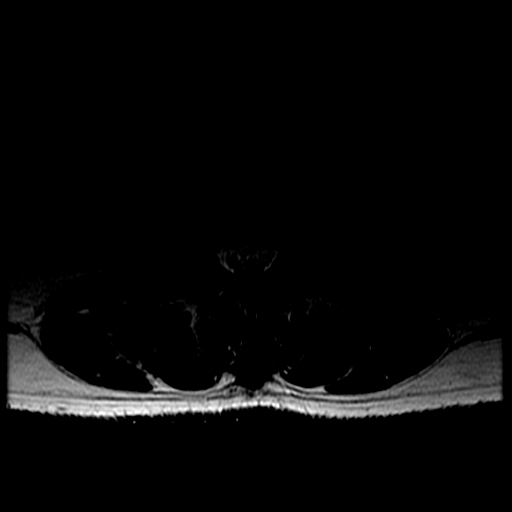
[im 36/42]
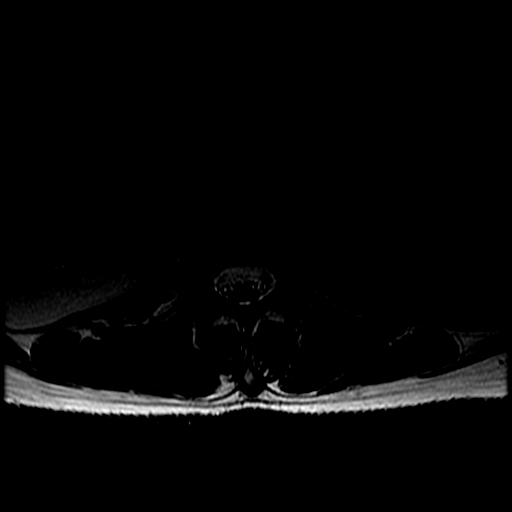

[Series 6: T1 · axial · 4.0mm · 0.39mm/px · z∈[-37,+113]mm · 3 of 42 slices shown (2 of 2)]
[im 6/42]
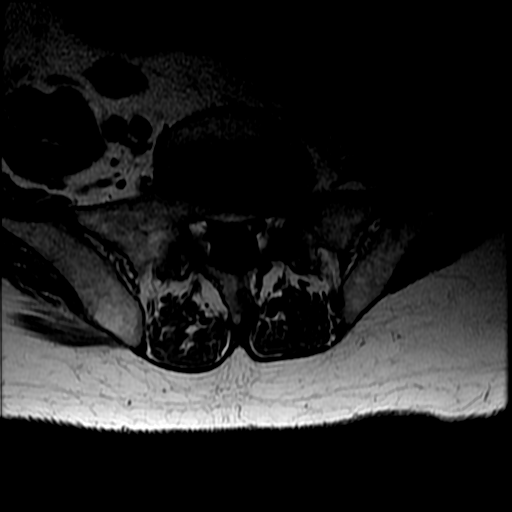
[im 21/42]
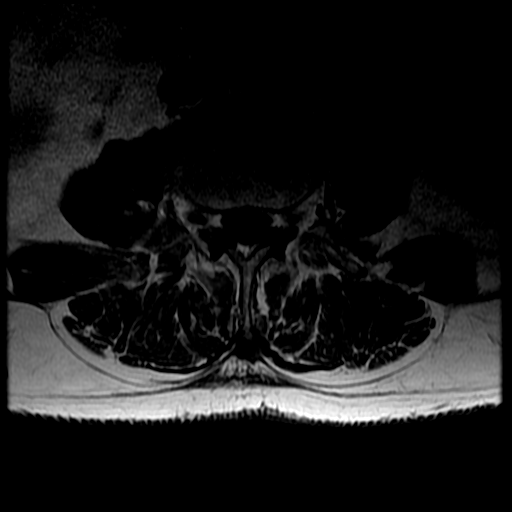
[im 36/42]
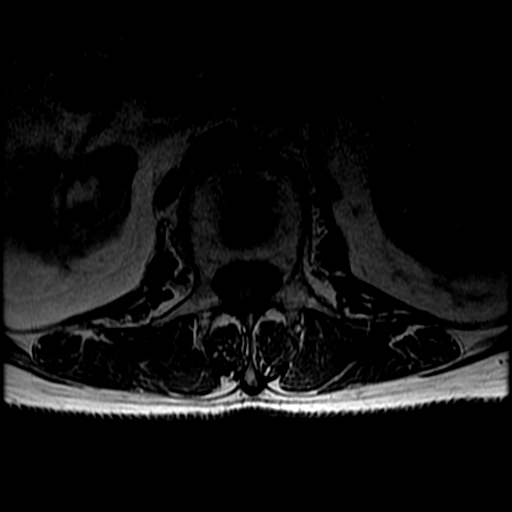

[Series 7: T2 · sagittal · 4.0mm · 0.51mm/px · 3 of 12 slices shown (2 of 2)]
[im 1/12]
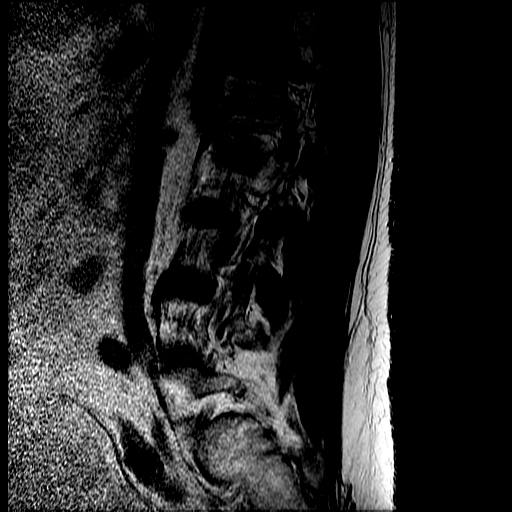
[im 6/12]
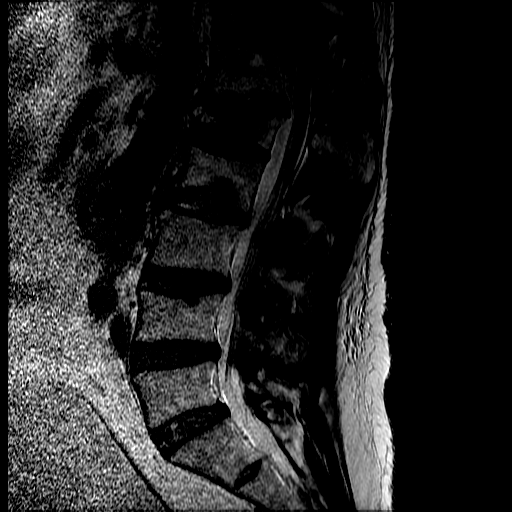
[im 12/12]
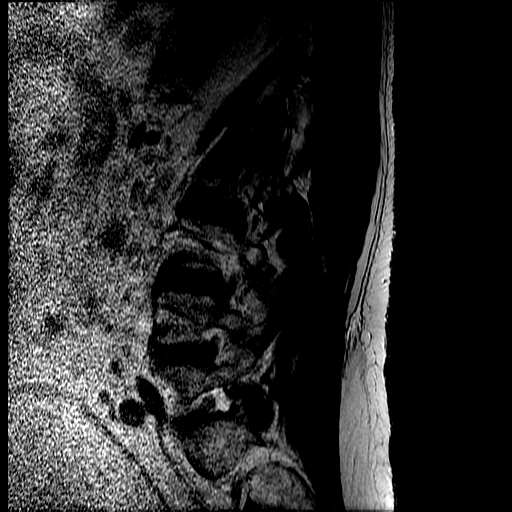

[18 of 48 positions shown; findings below may reference images not displayed]

FINDINGS: Segmentation:  5 lumbar type vertebral bodies.

Alignment:  Normal

Vertebrae: As seen previously. Remote compression fracture at L1
with near complete loss of height centrally. Posterior bowing of the
posterosuperior margin of the vertebral body by 5 mm indents the
thecal sac but does not cause neural compression. Mild residual
edema centrally. Previously seen inferior endplate fracture at L2
with loss of height centrally of [DATE]. Pattern of marrow edema
appears the same. No progression. Other vertebral levels appear
negative.

Conus medullaris and cauda equina: Conus extends to the L2 level.
Conus and cauda equina appear normal.

Paraspinal and other soft tissues: Parapelvic renal cyst on the
left. Infrarenal abdominal aortic aneurysm with maximal diameter
cm. No sign of retroperitoneal bleeding.

Disc levels:

Mild disc bulges and facet hypertrophy at L3-4 and above but without
compressive stenosis.

L4-5: Moderate disc bulge. Mild facet hypertrophy. Mild stenosis of
both lateral recesses.

L5-S1: Mild disc bulge. Mild facet hypertrophy. No compressive
stenosis.
IMPRESSION: No significant change since the study of 3 weeks ago. Compression
fracture at L1 with near complete loss of height centrally and
anteriorly. Posterior bowing of the posterosuperior margin of the
vertebral body by 5 mm but without apparent compressive effect upon
the neural structures.

More recent inferior endplate fracture at L2 with loss of height of
[DATE]. No progression. Marrow pattern appears the same.

Ordinary lower lumbar disc bulges and facet degeneration without
likely significant stenosis. The findings could certainly relate to
back pain.

Infrarenal abdominal aortic aneurysm with maximal transverse
diameter of 4.2 cm. Recommend followup by ultrasound in 1 year. This
recommendation follows ACR consensus guidelines: White Paper of the
ACR Incidental Findings Committee II on Vascular Findings. [HOSPITAL] 0667; [DATE].

## 2019-05-08 ENCOUNTER — Other Ambulatory Visit: Payer: Self-pay | Admitting: Family Medicine

## 2019-05-08 ENCOUNTER — Other Ambulatory Visit: Payer: Self-pay

## 2019-05-08 DIAGNOSIS — K6389 Other specified diseases of intestine: Secondary | ICD-10-CM | POA: Insufficient documentation

## 2019-05-08 HISTORY — DX: Other specified diseases of intestine: K63.89

## 2019-05-19 ENCOUNTER — Other Ambulatory Visit: Payer: Medicare Other

## 2019-05-23 ENCOUNTER — Inpatient Hospital Stay: Admission: RE | Admit: 2019-05-23 | Payer: Medicare Other | Source: Ambulatory Visit

## 2019-05-29 ENCOUNTER — Ambulatory Visit
Admission: RE | Admit: 2019-05-29 | Discharge: 2019-05-29 | Disposition: A | Discharge status: Home / Self Care | Payer: Medicare Other | Source: Ambulatory Visit | Attending: Family Medicine | Admitting: Family Medicine

## 2019-05-29 DIAGNOSIS — K6389 Other specified diseases of intestine: Secondary | ICD-10-CM

## 2019-05-29 MED ORDER — IOPAMIDOL (ISOVUE-300) INJECTION 61%
100.0000 mL | Freq: Once | INTRAVENOUS | Status: AC | PRN
  Administered 2019-05-29: 100 mL via INTRAVENOUS

## 2019-06-05 ENCOUNTER — Other Ambulatory Visit: Payer: Medicare Other

## 2020-07-22 ENCOUNTER — Other Ambulatory Visit: Payer: Self-pay | Admitting: Family Medicine

## 2020-07-22 DIAGNOSIS — R410 Disorientation, unspecified: Secondary | ICD-10-CM

## 2020-08-17 ENCOUNTER — Other Ambulatory Visit: Payer: Self-pay

## 2020-08-17 ENCOUNTER — Ambulatory Visit
Admission: RE | Admit: 2020-08-17 | Discharge: 2020-08-17 | Disposition: A | Discharge status: Home / Self Care | Payer: Medicare Other | Source: Ambulatory Visit | Attending: Family Medicine | Admitting: Family Medicine

## 2020-08-17 DIAGNOSIS — R410 Disorientation, unspecified: Secondary | ICD-10-CM

## 2020-08-17 MED ORDER — GADOBENATE DIMEGLUMINE 529 MG/ML IV SOLN
15.0000 mL | Freq: Once | INTRAVENOUS | Status: AC | PRN
  Administered 2020-08-17: 15 mL via INTRAVENOUS

## 2020-12-24 ENCOUNTER — Encounter: Payer: Self-pay | Admitting: Student

## 2020-12-24 ENCOUNTER — Other Ambulatory Visit: Payer: Self-pay

## 2020-12-24 ENCOUNTER — Ambulatory Visit: Payer: Medicare Other | Admitting: Student

## 2020-12-24 VITALS — BP 124/81 | HR 64 | Temp 98.4°F | Resp 17 | Ht 70.0 in | Wt 191.8 lb

## 2020-12-24 DIAGNOSIS — R5383 Other fatigue: Secondary | ICD-10-CM

## 2020-12-24 DIAGNOSIS — R6889 Other general symptoms and signs: Secondary | ICD-10-CM

## 2020-12-24 DIAGNOSIS — R0989 Other specified symptoms and signs involving the circulatory and respiratory systems: Secondary | ICD-10-CM

## 2020-12-24 DIAGNOSIS — I10 Essential (primary) hypertension: Secondary | ICD-10-CM

## 2020-12-24 DIAGNOSIS — I491 Atrial premature depolarization: Secondary | ICD-10-CM

## 2020-12-24 MED ORDER — METOPROLOL SUCCINATE ER 25 MG PO TB24: 12.5000 mg | ORAL_TABLET | Freq: Every day | ORAL | 3 refills | Status: DC

## 2020-12-24 NOTE — Progress Notes (Signed)
Primary Physician/Referring:  Barbie Banner, MD  Patient ID: Carlos Short., male    DOB: 1950-10-21, 70 y.o.   MRN: [id number]  No chief complaint on file.  HPI:    Carlos Short.  is a 70 y.o. Caucasian male with history of bipolar versus schizophrenia, alcohol abuse (quit in 1985), present tobacco use (>50-year pack history).  He is referred by his PCP for evaluation of bradycardia.  Patient states over the last few weeks he has been experiencing worsening fatigue and decreased exercise tolerance.  However he also notes he has not been active over the last several months, without formal exercise routine.  Recently started physical therapy to get back into shape.  He patient notes that in [facility] 2022 he had an episode of bilateral leg edema lasting for approximately 2 weeks.  Denies chest pain, palpitations, syncope, near syncope, orthopnea, PND.  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 Corliss Skains, MD;  Location: [facility];  Service: General;  Laterality: Bilateral;   INSERTION OF MESH Bilateral 08/16/2012   Procedure: INSERTION OF MESH;  Surgeon: Wilmon Arms. Corliss Skains, MD;  Location: [facility];  Service: General;  Laterality: Bilateral;   MULTIPLE TOOTH EXTRACTIONS     TONSILLECTOMY     TOTAL HIP REVISION Right 2005   UPPER GI ENDOSCOPY     Family History  Problem Relation Age of Onset    Depression Mother    Hypertension Father    Heart attack Father 18       2 HEART ATTACKS   Kidney cancer Father    Lupus Father    Depression Brother    Migraines Neg Hx     Social History   Tobacco Use   Smoking status: Every Day    Packs/day: 0.25    Years: 50.00    Pack years: 12.50    Types: Cigarettes   Smokeless tobacco: Never  Substance Use Topics   Alcohol use: Not Currently    Comment: was an alchoholic, 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   Marital Status: Married   ROS  Review of Systems  Constitutional: Positive for malaise/fatigue. Negative for weight gain.  Cardiovascular:  Negative for chest pain, claudication, leg swelling, near-syncope, orthopnea, palpitations, paroxysmal nocturnal dyspnea and syncope.       Decreased exercise tolerance  Respiratory:  Negative for shortness of breath.   Neurological:  Negative for dizziness.   Objective  Blood pressure 124/81, pulse 64, temperature 98.4 F (36.9 C), temperature source Temporal, resp. rate 17, height 5\' 10"  (1.778 m), weight 191 lb 12.8 oz (87 kg), SpO2 96 %.  Vitals with BMI 12/24/2020 09/24/2018 09/24/2018  Height 5\' 10"  - -  Weight 191 lbs 13 oz - -  BMI 27.52 - -  Systolic 124 127 09/26/2018  Diastolic 81 77 80  Pulse 64 68  67      Physical Exam Vitals reviewed.  HENT:     Head: Normocephalic and atraumatic.  Cardiovascular:     Rate and Rhythm: Normal rate and regular rhythm.     Pulses: Intact distal pulses.     Heart sounds: S1 normal and S2 normal. No murmur heard.   No gallop.  Pulmonary:     Effort: Pulmonary effort is normal. No respiratory distress.     Breath sounds: No wheezing, rhonchi or rales.  Abdominal:     Comments: Prominent abdominal pulsation  Musculoskeletal:     Right lower leg: No edema.     Left lower leg: No edema.  Neurological:     Mental Status: He is alert.    Laboratory examination:   No results for input(s): NA, K, CL, CO2, GLUCOSE, BUN, CREATININE, CALCIUM, GFRNONAA, GFRAA in  the last 8760 hours. CrCl cannot be calculated (Patient's most recent lab result is older than the maximum 21 days allowed.).  CMP Latest Ref Rng & Units 09/24/2018 06/14/2017 06/13/2017  Glucose 70 - 99 mg/dL 161(W) 960(A) 540(J)  BUN 8 - 23 mg/dL 18 81(X) 17  Creatinine 0.61 - 1.24 mg/dL 9.14(N) 8.29 5.62  Sodium 135 - 145 mmol/L 138 142 136  Potassium 3.5 - 5.1 mmol/L 3.8 3.6 3.6  Chloride 98 - 111 mmol/L 104 111 105  CO2 22 - 32 mmol/L 22 23 22   Calcium 8.9 - 10.3 mg/dL ) 1.3(Y) 8.6(V)  Total Protein 6.5 - 8.1 g/dL 7.0 - -  Total Bilirubin 0.3 - 1.2 mg/dL 0.7 - -  Alkaline Phos 38 - 126 U/L 56 - -  AST 15 - 41 U/L 15 - -  ALT 0 - 44 U/L 14 - -   CBC Latest Ref Rng & Units 09/24/2018 06/14/2017 06/13/2017  WBC 4.0 - 10.5 K/uL 20.2(H) 6.5 8.0  Hemoglobin 13.0 - 17.0 g/dL 08/13/2017 69.6 29.5  Hematocrit 39.0 - 52.0 % 45.6 40.2 44.7  Platelets 150 - 400 K/uL 139(L) 134(L) 148(L)    Lipid Panel No results for input(s): CHOL, TRIG, LDLCALC, VLDL, HDL, CHOLHDL, LDLDIRECT in the last 8760 hours.  HEMOGLOBIN A1C No results found for: HGBA1C, MPG TSH No results for input(s): TSH in the last 8760 hours.  External labs:   None  Allergies   Allergies  Allergen Reactions   Gabapentin     Other reaction(s): Other (See Comments) crazy    Medications Prior to Visit:   Outpatient Medications Prior to Visit  Medication Sig Dispense Refill   doxazosin (CARDURA) 8 MG tablet Take 16 mg by mouth 2 (two) times daily.      fluticasone (FLONASE) 50 MCG/ACT nasal spray Place 1 spray into both nostrils as needed.     HYDROmorphone (DILAUDID) 4 MG tablet Take 2-4 mg by mouth 4 (four) times daily as needed.     ibandronate (BONIVA) 150 MG tablet Take 1 tablet by mouth every 30 (thirty) days.     rOPINIRole (REQUIP) 1 MG tablet Take 1 mg by mouth 2 (two) times daily as needed.     sertraline (ZOLOFT) 100 MG tablet Take 150 mg by mouth daily.     traMADol (ULTRAM) 50 MG tablet Take 100 mg by mouth 3  (three) times daily.     traZODone (DESYREL) 100 MG tablet Take 1 tablet by mouth as needed.     clonazePAM (KLONOPIN) 0.5 MG tablet Take 0.25-0.5 mg by mouth 2 (two) times daily as needed for anxiety.  ondansetron (ZOFRAN) 4 MG tablet Take 1 tablet (4 mg total) by mouth every 8 (eight) hours as needed for nausea or vomiting. 12 tablet 0   oxyCODONE-acetaminophen (PERCOCET/ROXICET) 5-325 MG tablet Take 1 tablet by mouth every 4 (four) hours as needed for severe pain. 15 tablet 0   No facility-administered medications prior to visit.   Final Medications at End of Visit    Current Meds  Medication Sig   doxazosin (CARDURA) 8 MG tablet Take 16 mg by mouth 2 (two) times daily.    fluticasone (FLONASE) 50 MCG/ACT nasal spray Place 1 spray into both nostrils as needed.   HYDROmorphone (DILAUDID) 4 MG tablet Take 2-4 mg by mouth 4 (four) times daily as needed.   ibandronate (BONIVA) 150 MG tablet Take 1 tablet by mouth every 30 (thirty) days.   metoprolol succinate (TOPROL-XL) 25 MG 24 hr tablet Take 0.5 tablets (12.5 mg total) by mouth daily. Take with or immediately following a meal.   rOPINIRole (REQUIP) 1 MG tablet Take 1 mg by mouth 2 (two) times daily as needed.   sertraline (ZOLOFT) 100 MG tablet Take 150 mg by mouth daily.   traMADol (ULTRAM) 50 MG tablet Take 100 mg by mouth 3 (three) times daily.   traZODone (DESYREL) 100 MG tablet Take 1 tablet by mouth as needed.   Radiology:   No results found.  Cardiac Studies:   None   EKG:   12/24/2020: Sinus rhythm with frequent PACs at a rate of 77 bpm.  Left axis.  Right bundle branch block with secondary ST-T wave changes, cannot exclude anterior ischemia.   Assessment     ICD-10-CM   1. Primary hypertension  I10 EKG 12-Lead    2. PAC (premature atrial contraction)  I49.1 LONG TERM MONITOR (3-14 DAYS)    3. Decreased exercise tolerance  R68.89 PCV ECHOCARDIOGRAM COMPLETE    PCV MYOCARDIAL PERFUSION WO LEXISCAN    4.  Prominent abdominal aortic pulsation  R09.89 PCV AORTA DUPLEX    5. Other fatigue  R53.83 LONG TERM MONITOR (3-14 DAYS)       Medications Discontinued During This Encounter  Medication Reason   clonazePAM (KLONOPIN) 0.5 MG tablet Error   ondansetron (ZOFRAN) 4 MG tablet Error   oxyCODONE-acetaminophen (PERCOCET/ROXICET) 5-325 MG tablet Error    Meds ordered this encounter  Medications   metoprolol succinate (TOPROL-XL) 25 MG 24 hr tablet    Sig: Take 0.5 tablets (12.5 mg total) by mouth daily. Take with or immediately following a meal.    Dispense:  45 tablet    Refill:  3    Recommendations:   Carlos Echeverri. is a 70 y.o. Caucasian male with history of bipolar versus schizophrenia, alcohol abuse (quit in 1985), present tobacco use (>50-year pack history).  Patient was referred by PCP for evaluation of bradycardia, however EKG reveals heart rate of 77 bpm.  Suspect question of bradycardia was due to heart rate being checked via pulse oximeter and patient is having frequent PACs.  We will given patient's new conduction system disease as well as worsening fatigue and decreased exercise tolerance recommend further cardiac evaluation.  Will obtain echocardiogram, stress test.  We will also obtain Zio patch monitor to assess PAC burden.   Given frequent PACs suspect this may be underlying etiology of patient's fatigue and decreased exercise tolerance, shared decision was to proceed with.  Beta-blocker therapy.  We will start low-dose metoprolol succinate 12.5 mg p.o. daily.  On physical exam patient  has prominent abdominal pulse.  Given history of smoking we will obtain abdominal aortic duplex.  Follow-up in 8 weeks, sooner if needed, for results of cardiac testing.   Carlos Halsted, PA-C 12/24/2020, 4:14 PM Office: [phone]

## 2020-12-29 ENCOUNTER — Telehealth: Payer: Self-pay

## 2020-12-29 NOTE — Telephone Encounter (Signed)
NOTES SCANNED TO REFERRAL 

## 2020-12-30 ENCOUNTER — Other Ambulatory Visit: Payer: Medicare Other

## 2020-12-30 ENCOUNTER — Other Ambulatory Visit: Payer: Self-pay

## 2020-12-30 ENCOUNTER — Ambulatory Visit: Payer: Medicare Other

## 2020-12-30 DIAGNOSIS — R0989 Other specified symptoms and signs involving the circulatory and respiratory systems: Secondary | ICD-10-CM

## 2020-12-31 ENCOUNTER — Emergency Department (HOSPITAL_COMMUNITY): Payer: Medicare Other

## 2020-12-31 ENCOUNTER — Encounter (HOSPITAL_COMMUNITY): Payer: Self-pay

## 2020-12-31 ENCOUNTER — Inpatient Hospital Stay (HOSPITAL_COMMUNITY)
Admission: EM | Admit: 2020-12-31 | Discharge: 2021-01-03 | DRG: 872 | Disposition: A | Discharge status: Home / Self Care | Payer: Medicare Other | Attending: Internal Medicine | Admitting: Internal Medicine

## 2020-12-31 DIAGNOSIS — R3912 Poor urinary stream: Secondary | ICD-10-CM | POA: Diagnosis present

## 2020-12-31 DIAGNOSIS — A419 Sepsis, unspecified organism: Principal | ICD-10-CM | POA: Diagnosis present

## 2020-12-31 DIAGNOSIS — Z20822 Contact with and (suspected) exposure to covid-19: Secondary | ICD-10-CM | POA: Diagnosis present

## 2020-12-31 DIAGNOSIS — Z888 Allergy status to other drugs, medicaments and biological substances status: Secondary | ICD-10-CM

## 2020-12-31 DIAGNOSIS — F17210 Nicotine dependence, cigarettes, uncomplicated: Secondary | ICD-10-CM | POA: Diagnosis present

## 2020-12-31 DIAGNOSIS — G894 Chronic pain syndrome: Secondary | ICD-10-CM | POA: Diagnosis present

## 2020-12-31 DIAGNOSIS — Z79891 Long term (current) use of opiate analgesic: Secondary | ICD-10-CM

## 2020-12-31 DIAGNOSIS — Z87440 Personal history of urinary (tract) infections: Secondary | ICD-10-CM | POA: Diagnosis not present

## 2020-12-31 DIAGNOSIS — Z8249 Family history of ischemic heart disease and other diseases of the circulatory system: Secondary | ICD-10-CM | POA: Diagnosis not present

## 2020-12-31 DIAGNOSIS — R6520 Severe sepsis without septic shock: Secondary | ICD-10-CM | POA: Diagnosis present

## 2020-12-31 DIAGNOSIS — R351 Nocturia: Secondary | ICD-10-CM | POA: Diagnosis present

## 2020-12-31 DIAGNOSIS — F4323 Adjustment disorder with mixed anxiety and depressed mood: Secondary | ICD-10-CM | POA: Diagnosis present

## 2020-12-31 DIAGNOSIS — D696 Thrombocytopenia, unspecified: Secondary | ICD-10-CM | POA: Diagnosis present

## 2020-12-31 DIAGNOSIS — F418 Other specified anxiety disorders: Secondary | ICD-10-CM | POA: Diagnosis present

## 2020-12-31 DIAGNOSIS — N401 Enlarged prostate with lower urinary tract symptoms: Secondary | ICD-10-CM | POA: Diagnosis present

## 2020-12-31 DIAGNOSIS — E785 Hyperlipidemia, unspecified: Secondary | ICD-10-CM | POA: Diagnosis present

## 2020-12-31 DIAGNOSIS — R531 Weakness: Secondary | ICD-10-CM | POA: Diagnosis present

## 2020-12-31 DIAGNOSIS — G4700 Insomnia, unspecified: Secondary | ICD-10-CM | POA: Diagnosis present

## 2020-12-31 DIAGNOSIS — F32A Depression, unspecified: Secondary | ICD-10-CM | POA: Diagnosis present

## 2020-12-31 DIAGNOSIS — F419 Anxiety disorder, unspecified: Secondary | ICD-10-CM | POA: Diagnosis present

## 2020-12-31 DIAGNOSIS — N138 Other obstructive and reflux uropathy: Secondary | ICD-10-CM | POA: Diagnosis present

## 2020-12-31 DIAGNOSIS — Z79899 Other long term (current) drug therapy: Secondary | ICD-10-CM | POA: Diagnosis not present

## 2020-12-31 DIAGNOSIS — G9341 Metabolic encephalopathy: Secondary | ICD-10-CM | POA: Diagnosis present

## 2020-12-31 DIAGNOSIS — I714 Abdominal aortic aneurysm, without rupture, unspecified: Secondary | ICD-10-CM | POA: Diagnosis present

## 2020-12-31 DIAGNOSIS — N390 Urinary tract infection, site not specified: Secondary | ICD-10-CM | POA: Diagnosis present

## 2020-12-31 DIAGNOSIS — G2581 Restless legs syndrome: Secondary | ICD-10-CM | POA: Diagnosis present

## 2020-12-31 DIAGNOSIS — R4182 Altered mental status, unspecified: Secondary | ICD-10-CM

## 2020-12-31 HISTORY — DX: Urinary tract infection, site not specified: A41.9

## 2020-12-31 HISTORY — DX: Thrombocytopenia, unspecified: D69.6

## 2020-12-31 LAB — CBC WITH DIFFERENTIAL/PLATELET
Abs Immature Granulocytes: 0.08 10*3/uL — ABNORMAL HIGH (ref 0.00–0.07)
Basophils Absolute: 0 10*3/uL (ref 0.0–0.1)
Basophils Relative: 0 %
Eosinophils Absolute: 0 10*3/uL (ref 0.0–0.5)
Eosinophils Relative: 0 %
HCT: 45.4 % (ref 39.0–52.0)
Hemoglobin: 15.6 g/dL (ref 13.0–17.0)
Immature Granulocytes: 1 %
Lymphocytes Relative: 7 %
Lymphs Abs: 1.2 10*3/uL (ref 0.7–4.0)
MCH: 32.2 pg (ref 26.0–34.0)
MCHC: 34.4 g/dL (ref 30.0–36.0)
MCV: 93.6 fL (ref 80.0–100.0)
Monocytes Absolute: 1.1 10*3/uL — ABNORMAL HIGH (ref 0.1–1.0)
Monocytes Relative: 6 %
Neutro Abs: 15.3 10*3/uL — ABNORMAL HIGH (ref 1.7–7.7)
Neutrophils Relative %: 86 %
Platelets: 120 10*3/uL — ABNORMAL LOW (ref 150–400)
RBC: 4.85 MIL/uL (ref 4.22–5.81)
RDW: 13 % (ref 11.5–15.5)
WBC: 17.6 10*3/uL — ABNORMAL HIGH (ref 4.0–10.5)
nRBC: 0 % (ref 0.0–0.2)

## 2020-12-31 LAB — COMPREHENSIVE METABOLIC PANEL
ALT: 13 U/L (ref 0–44)
AST: 16 U/L (ref 15–41)
Albumin: 3.9 g/dL (ref 3.5–5.0)
Alkaline Phosphatase: 58 U/L (ref 38–126)
Anion gap: 10 (ref 5–15)
BUN: 12 mg/dL (ref 8–23)
CO2: 20 mmol/L — ABNORMAL LOW (ref 22–32)
Calcium: 8.7 mg/dL — ABNORMAL LOW (ref 8.9–10.3)
Chloride: 107 mmol/L (ref 98–111)
Creatinine, Ser: 1.13 mg/dL (ref 0.61–1.24)
GFR, Estimated: >60 mL/min (ref >60)
Glucose, Bld: 112 mg/dL — ABNORMAL HIGH (ref 70–99)
Potassium: 3.7 mmol/L (ref 3.5–5.1)
Sodium: 137 mmol/L (ref 135–145)
Total Bilirubin: 0.7 mg/dL (ref 0.3–1.2)
Total Protein: 7.1 g/dL (ref 6.5–8.1)

## 2020-12-31 LAB — URINALYSIS, ROUTINE W REFLEX MICROSCOPIC
Bilirubin Urine: NEGATIVE
Glucose, UA: NEGATIVE mg/dL
Ketones, ur: NEGATIVE mg/dL
Nitrite: POSITIVE — AB
Protein, ur: 30 mg/dL — AB
Specific Gravity, Urine: 1.014 (ref 1.005–1.030)
pH: 5 (ref 5.0–8.0)

## 2020-12-31 LAB — RESP PANEL BY RT-PCR (FLU A&B, COVID) ARPGX2
Influenza A by PCR: NEGATIVE
Influenza B by PCR: NEGATIVE
SARS Coronavirus 2 by RT PCR: NEGATIVE

## 2020-12-31 LAB — ETHANOL: Alcohol, Ethyl (B): <10 mg/dL (ref <10)

## 2020-12-31 LAB — TROPONIN I (HIGH SENSITIVITY): Troponin I (High Sensitivity): 7 ng/L (ref <18)

## 2020-12-31 LAB — LACTIC ACID, PLASMA: Lactic Acid, Venous: 1.7 mmol/L (ref 0.5–1.9)

## 2020-12-31 MED ORDER — ONDANSETRON HCL 4 MG/2ML IJ SOLN
4.0000 mg | Freq: Four times a day (QID) | INTRAMUSCULAR | Status: DC | PRN
  Administered 2020-12-31 – 2021-01-01 (×2): 4 mg via INTRAVENOUS
  Filled 2020-12-31 (×2): qty 2

## 2020-12-31 MED ORDER — LACTATED RINGERS IV BOLUS (SEPSIS)
2000.0000 mL | Freq: Once | INTRAVENOUS | Status: AC
  Administered 2020-12-31: 2000 mL via INTRAVENOUS

## 2020-12-31 MED ORDER — SODIUM CHLORIDE 0.9 % IV SOLN
1.0000 g | Freq: Once | INTRAVENOUS | Status: AC
  Administered 2020-12-31: 1 g via INTRAVENOUS
  Filled 2020-12-31: qty 10

## 2020-12-31 MED ORDER — SODIUM CHLORIDE 0.9 % IV BOLUS
1000.0000 mL | Freq: Once | INTRAVENOUS | Status: AC
  Administered 2020-12-31: 1000 mL via INTRAVENOUS

## 2020-12-31 MED ORDER — CEFTRIAXONE SODIUM 1 G IJ SOLR
1.0000 g | INTRAMUSCULAR | Status: DC
  Administered 2021-01-01 – 2021-01-02 (×2): 1 g via INTRAVENOUS
  Filled 2020-12-31 (×3): qty 10

## 2020-12-31 MED ORDER — ENOXAPARIN SODIUM 40 MG/0.4ML IJ SOSY
40.0000 mg | PREFILLED_SYRINGE | INTRAMUSCULAR | Status: DC
  Administered 2020-12-31 – 2021-01-02 (×3): 40 mg via SUBCUTANEOUS
  Filled 2020-12-31 (×3): qty 0.4

## 2020-12-31 MED ORDER — ACETAMINOPHEN 325 MG PO TABS
650.0000 mg | ORAL_TABLET | Freq: Once | ORAL | Status: AC
  Administered 2020-12-31: 650 mg via ORAL
  Filled 2020-12-31: qty 2

## 2020-12-31 MED ORDER — SERTRALINE HCL 50 MG PO TABS
150.0000 mg | ORAL_TABLET | Freq: Every day | ORAL | Status: DC
  Administered 2021-01-01 – 2021-01-03 (×3): 150 mg via ORAL
  Filled 2020-12-31 (×3): qty 1

## 2020-12-31 MED ORDER — TRAZODONE HCL 100 MG PO TABS
100.0000 mg | ORAL_TABLET | Freq: Every evening | ORAL | Status: DC | PRN
  Administered 2021-01-01 – 2021-01-02 (×2): 100 mg via ORAL
  Filled 2020-12-31 (×2): qty 1

## 2020-12-31 MED ORDER — DOXAZOSIN MESYLATE 8 MG PO TABS
16.0000 mg | ORAL_TABLET | Freq: Every day | ORAL | Status: DC
  Administered 2020-12-31 – 2021-01-03 (×4): 16 mg via ORAL
  Filled 2020-12-31 (×3): qty 4
  Filled 2020-12-31: qty 2
  Filled 2020-12-31: qty 4
  Filled 2020-12-31 (×3): qty 2

## 2020-12-31 MED ORDER — ROPINIROLE HCL 1 MG PO TABS
1.0000 mg | ORAL_TABLET | Freq: Two times a day (BID) | ORAL | Status: DC | PRN
  Administered 2021-01-02: 1 mg via ORAL
  Filled 2020-12-31: qty 1

## 2020-12-31 MED ORDER — TRAMADOL HCL 50 MG PO TABS
100.0000 mg | ORAL_TABLET | Freq: Four times a day (QID) | ORAL | Status: DC | PRN
Start: 2020-12-31 — End: 2021-01-03
  Administered 2021-01-01 – 2021-01-02 (×4): 100 mg via ORAL
  Filled 2020-12-31 (×4): qty 2

## 2020-12-31 MED ORDER — METOPROLOL SUCCINATE ER 25 MG PO TB24
12.5000 mg | ORAL_TABLET | Freq: Every day | ORAL | Status: DC
  Administered 2021-01-01 – 2021-01-03 (×3): 12.5 mg via ORAL
  Filled 2020-12-31 (×3): qty 1

## 2020-12-31 NOTE — Plan of Care (Signed)
  Problem: Clinical Measurements: Goal: Ability to maintain clinical measurements within normal limits will improve 12/31/2020 2316 by Rocco Pauls, RN Outcome: Progressing 12/31/2020 2316 by Rocco Pauls, RN Outcome: Progressing Goal: Will remain free from infection 12/31/2020 2316 by Rocco Pauls, RN Outcome: Progressing 12/31/2020 2316 by Rocco Pauls, RN Outcome: Progressing   Problem: Activity: Goal: Risk for activity intolerance will decrease 12/31/2020 2316 by Rocco Pauls, RN Outcome: Progressing 12/31/2020 2316 by Rocco Pauls, RN Outcome: Progressing   Problem: Pain Managment: Goal: General experience of comfort will improve 12/31/2020 2316 by Rocco Pauls, RN Outcome: Progressing 12/31/2020 2316 by Rocco Pauls, RN Outcome: Progressing   Problem: Safety: Goal: Ability to remain free from injury will improve 12/31/2020 2316 by Rocco Pauls, RN Outcome: Progressing 12/31/2020 2316 by Rocco Pauls, RN Outcome: Progressing

## 2020-12-31 NOTE — ED Provider Notes (Signed)
[facility]-EMERGENCY DEPT Provider Note   CSN: [id number] Arrival date & time: 12/31/20  1003     History Chief Complaint  Patient presents with   Chest Pain   Altered Mental Status    Carlos Short. is a 70 y.o. male.  Patient presents chief complaint of fevers, shortness of breath, confusion.  He states he had chest pain off and on for the last several weeks but denies any chest pain at this time.  He saw his cardiologist and was scheduled for outpatient evaluations.  He presents to the ER today because his wife states that he is been more repetitive and slightly confused over the course the last 2 to 3 days.  Patient himself denies any headache or chest pain or abdominal pain.  No reports of fevers or cough or vomiting or diarrhea.      Past Medical History:  Diagnosis Date   Abnormal weight gain    Anxiety    Depression    Hyperlipidemia    Hypertrophy of prostate with urinary obstruction and other lower urinary tract symptoms (LUTS)    Insomnia, unspecified    Mood disorder in conditions classified elsewhere    Other chronic pain    neuropathy pain rt foot post op hip surg   Wears glasses    Wears partial dentures    top and bottom partial    Patient Active Problem List   Diagnosis Date Noted   Sepsis (HCC) 06/13/2017   Acute lower UTI 06/13/2017   Acute metabolic encephalopathy 06/13/2017   Mood disorder (HCC) 06/13/2017   AAA (abdominal aortic aneurysm) 06/13/2017   Chronic low back pain without sciatica 04/11/2017   Encounter for Medicare annual wellness exam 04/11/2017   Restless leg syndrome 04/11/2017   Closed compression fracture of L1 vertebra (HCC) 10/06/2016   Benign prostatic hyperplasia with weak urinary stream 04/13/2016   Chronic pain syndrome 04/13/2016   Acute gastritis without hemorrhage 11/09/2015   Situational mixed anxiety and depressive disorder 06/08/2015   History of inguinal hernia 04/26/2015   History of  kidney stones 04/26/2015   Bilateral inguinal hernia 07/23/2012    Past Surgical History:  Procedure Laterality Date   INGUINAL HERNIA REPAIR Bilateral 08/16/2012   Procedure: LAPAROSCOPIC BILATERAL INGUINAL HERNIA REPAIR;  Surgeon: Wilmon Arms. Corliss Skains, MD;  Location: [facility];  Service: General;  Laterality: Bilateral;   INSERTION OF MESH Bilateral 08/16/2012   Procedure: INSERTION OF MESH;  Surgeon: Wilmon Arms. Corliss Skains, MD;  [facility];  Service: General;  Laterality: Bilateral;   MULTIPLE TOOTH EXTRACTIONS     TONSILLECTOMY     TOTAL HIP REVISION Right 2005   UPPER GI ENDOSCOPY         Family History  Problem Relation Age of Onset   Depression Mother    Hypertension Father    Heart attack Father 88       2 HEART ATTACKS   Kidney cancer Father    Lupus Father    Depression Brother    Migraines Neg Hx     Social History   Tobacco Use   Smoking status: Every Day    Packs/day: 0.25    Years: 50.00    Pack years: 12.50    Types: Cigarettes   Smokeless tobacco: Never  Vaping Use   Vaping Use: Never used  Substance Use Topics   Alcohol use: Not Currently    Comment: was an alchoholic, 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  Drug use: Yes    Types: Cocaine, Marijuana, Amphetamines    Comment: QUIT IN 1985, still currently still uses marijuana    Home Medications Prior to Admission medications   Medication Sig Start Date End Date Taking? Authorizing Provider  doxazosin (CARDURA) 8 MG tablet Take 16 mg by mouth daily. 05/03/17  Yes [provider]  fluticasone (FLONASE) 50 MCG/ACT nasal spray Place 1 spray into both nostrils daily as needed for allergies. 12/15/20  Yes [provider]  HYDROmorphone (DILAUDID) 4 MG tablet Take 4 mg by mouth every 4 (four) hours as needed for severe pain or moderate pain (breakthrough pain).   Yes [provider]  ibandronate (BONIVA) 150 MG tablet Take 1 tablet by mouth every 30 (thirty) days.  11/05/20  Yes [provider]  rOPINIRole (REQUIP) 1 MG tablet Take 1 mg by mouth 2 (two) times daily as needed (restless legs). 12/06/20  Yes [provider]  sertraline (ZOLOFT) 100 MG tablet Take 150 mg by mouth daily. 05/03/17  Yes [provider]  traMADol (ULTRAM) 50 MG tablet Take 100 mg by mouth 3 (three) times daily.   Yes [provider]  traZODone (DESYREL) 100 MG tablet Take 1 tablet by mouth at bedtime as needed for sleep. 10/20/20  Yes [provider]  metoprolol succinate (TOPROL-XL) 25 MG 24 hr tablet Take 0.5 tablets (12.5 mg total) by mouth daily. Take with or immediately following a meal. 12/24/20 12/19/21  Cantwell, Celeste C, PA-C    Allergies    Gabapentin  Review of Systems   Review of Systems  Constitutional:  Positive for fever.  HENT:  Negative for ear pain and sore throat.   Eyes:  Negative for pain.  Respiratory:  Negative for cough.   Cardiovascular:  Positive for chest pain.  Gastrointestinal:  Negative for abdominal pain.  Genitourinary:  Negative for flank pain.  Musculoskeletal:  Negative for back pain.  Skin:  Negative for color change and rash.  Neurological:  Negative for syncope.  All other systems reviewed and are negative.  Physical Exam Updated Vital Signs BP 117/83   Pulse 63   Temp 99.1 F (37.3 C) (Oral)   Resp (!) 25   Ht 5\' 10"  (1.778 m)   Wt 87 kg   SpO2 99%   BMI 27.52 kg/m   Physical Exam Constitutional:      Appearance: He is well-developed.  HENT:     Head: Normocephalic.     Nose: Nose normal.  Eyes:     Extraocular Movements: Extraocular movements intact.  Cardiovascular:     Rate and Rhythm: Normal rate.  Pulmonary:     Effort: Pulmonary effort is normal.  Skin:    Coloration: Skin is not jaundiced.  Neurological:     General: No focal deficit present.     Mental Status: He is alert. Mental status is at baseline.     Comments: Patient oriented to place and person,  however he thinks the year is 2020.  No other focal neurodeficit noted.    ED Results / Procedures / Treatments   Labs (all labs ordered are listed, but only abnormal results are displayed) Labs Reviewed  CBC WITH DIFFERENTIAL/PLATELET - Abnormal; Notable for the following components:      Result Value   WBC 17.6 (*)    Platelets 120 (*)    Neutro Abs 15.3 (*)    Monocytes Absolute 1.1 (*)    Abs Immature Granulocytes 0.08 (*)  All other components within normal limits  COMPREHENSIVE METABOLIC PANEL - Abnormal; Notable for the following components:   CO2 20 (*)    Glucose, Bld 112 (*)    Calcium 8.7 (*)    All other components within normal limits  URINALYSIS, ROUTINE W REFLEX MICROSCOPIC - Abnormal; Notable for the following components:   APPearance CLOUDY (*)    Hgb urine dipstick LARGE (*)    Protein, ur 30 (*)    Nitrite POSITIVE (*)    Leukocytes,Ua LARGE (*)    All other components within normal limits  RESP PANEL BY RT-PCR (FLU A&B, COVID) ARPGX2  CULTURE, BLOOD (ROUTINE X 2)  CULTURE, BLOOD (ROUTINE X 2)  ETHANOL  LACTIC ACID, PLASMA  TROPONIN I (HIGH SENSITIVITY)    EKG None  Radiology CT Head Wo Contrast  Result Date: 12/31/2020 CLINICAL DATA:  70 year old male with altered mental status. Left side chest pain, fever 102 F. EXAM: CT HEAD WITHOUT CONTRAST TECHNIQUE: Contiguous axial images were obtained from the base of the skull through the vertex without intravenous contrast. COMPARISON:  Brain MRI 08/17/2020. FINDINGS: Brain: Cerebral volume is stable since 2019. No midline shift, ventriculomegaly, mass effect, evidence of mass lesion, intracranial hemorrhage or evidence of cortically based acute infarction. Gray-white matter differentiation appears stable and normal for age. No cerebral edema or encephalomalacia identified. Vascular: Calcified atherosclerosis at the skull base. No suspicious intracranial vascular hyperdensity. Skull: Stable. Osteopenia. No  acute osseous abnormality identified. Sinuses/Orbits: Improved paranasal sinus aeration since 2019. Scattered mild mucosal thickening and occasional bubbly opacity appears stable since June. Tympanic cavities and mastoids remain clear. Other: Visualized orbits and scalp soft tissues are within normal limits. IMPRESSION: 1. Stable since 2019 and negative for age non contrast CT appearance of the brain. 2. Mild bilateral paranasal sinus inflammation, not significantly changed since June. Electronically Signed   By: Odessa Fleming M.D.   On: 12/31/2020 11:58   DG Chest Port 1 View  Result Date: 12/31/2020 CLINICAL DATA:  fever EXAM: PORTABLE CHEST 1 VIEW COMPARISON:  None. FINDINGS: The cardiomediastinal silhouette is within normal limits. No pleural effusion. No pneumothorax. No mass or consolidation. No definite displaced rib fracture. IMPRESSION: No acute abnormality identified in the chest. No consolidative pneumonia. Electronically Signed   By: Olive Bass M.D.   On: 12/31/2020 11:42    Procedures Procedures   Medications Ordered in ED Medications  acetaminophen (TYLENOL) tablet 650 mg (650 mg Oral Given 12/31/20 1107)  sodium chloride 0.9 % bolus 1,000 mL (0 mLs Intravenous Stopped 12/31/20 1403)  cefTRIAXone (ROCEPHIN) 1 g in sodium chloride 0.9 % 100 mL IVPB (1 g Intravenous New Bag/Given 12/31/20 1409)    ED Course  I have reviewed the triage vital signs and the nursing notes.  Pertinent labs & imaging results that were available during my care of the patient were reviewed by me and considered in my medical decision making (see chart for details).    MDM Rules/Calculators/A&P                           Patient vital signs within normal limits.  White count elevated, lactic acid normal.  Blood cultures are sent.  Urinalysis positive for UTI which is likely the source.  Patient given IV fluid resuscitation Tylenol and Rocephin.  Given his fever confusion and UTI with elevated white  count will be brought to the hospitalist team.  Final Clinical Impression(s) / ED Diagnoses Final diagnoses:  Lower urinary tract infectious disease  Altered mental status, unspecified altered mental status type    Rx / DC Orders ED Discharge Orders     None        Cheryll Cockayne, MD 12/31/20 1459

## 2020-12-31 NOTE — ED Notes (Signed)
MD approved PO. Pt received lunch tray.

## 2020-12-31 NOTE — ED Triage Notes (Addendum)
Pt arrived via POV, c/o left sided chest pain, SOB, issues with short term memory and fevers. States has been seen by cardiology recently.   States fever around 102 at home

## 2020-12-31 NOTE — H&P (Signed)
History and Physical    Carlos Short. ZOX:[id number] DOB: December 15, 1950 DOA: 12/31/2020  PCP: Barbie Banner, MD  Patient coming from: Home.  I have personally briefly reviewed patient's old medical records in [facility]  Chief Complaint: Altered mental status, fever, SOB and on/off chest pain.  HPI: Carlos Short. is a 70 y.o. male with medical history significant of anxiety, depression, hyperlipidemia, BPH, history of UTIs, restless leg syndrome who was brought to the emergency department via POV with complaints of AMS, dyspnea, left-sided chest pain and fever of 102 F at home since early in the morning.  The patient was altered and does not remember everything that happened.  However, he stated he was in his usual state of health when he went to bed last night.  He had a frontal headache earlier that has since then resolved.  No rhinorrhea, sore throat, productive cough, wheezing or hemoptysis.  No chest pain this time or dyspnea this time.  No palpitations, diaphoresis, PND, orthopnea or pitting edema of the lower extremities.  Denied abdominal pain, diarrhea, constipation, melena or hematochezia.  He thinks he has some flank pain earlier, but no dysuria or hematuria.  He gets frequent nocturia.  No polyuria, polydipsia, polyphagia or blurred vision.  ED Course: Initial vital signs were temperature 99.1 F pulse 120, respirations 17, BP 101/67 mmHg O2 sat 96% on room air.  The patient received 650 mg of acetaminophen p.o., ceftriaxone 1 g IVPB and 1000 mL of NS bolus.  He had an episode of emesis while in the ER and was given ondansetron.  Lab work: Urinalysis showed large hemoglobinuria, proteinuria 30 mg/dL, positive nitrite and leukocyte esterase.  Microscopic examination still not available.  CBC with a white count of 17.6 with 86% neutrophils, hemoglobin 15.6 g/dL platelets 811.  CMP showed a CO2 of 20 mmol/L, glucose of 112 and calcium of 8.7 mg/dL.  Corrected calcium  is 8.8 mg/dL.  Renal and hepatic function were normal.  Imaging: One-view portable chest radiograph did not show any acute abnormality.  CT head without contrast was stable since 2019 and negative for age noncontrast CT appearance of the brain.  There was mild bilateral paranasal sinus inflammation.  Please see images and full radiology report for further details.  Review of Systems: As per HPI otherwise all other systems reviewed and are negative.  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 Corliss Skains, MD;  Location: [facility];  Service: General;  Laterality: Bilateral;   INSERTION OF MESH Bilateral 08/16/2012   Procedure: INSERTION OF MESH;  Surgeon: Wilmon Arms. Corliss Skains, MD;  Location: [facility];  Service: General;  Laterality: Bilateral;   MULTIPLE TOOTH EXTRACTIONS     TONSILLECTOMY     TOTAL HIP REVISION Right 2005   UPPER GI ENDOSCOPY     Social History  reports that he has been smoking cigarettes. He has a 12.50 pack-year smoking history. He has never used smokeless tobacco. He reports that he does not currently use alcohol. He reports current drug use. Drugs: Cocaine, Marijuana, and Amphetamines.  Allergies  Allergen Reactions   Gabapentin     Other reaction(s): Other (See Comments) crazy   Family History  Problem Relation Age of Onset   Depression Mother    Hypertension Father    Heart attack Father 32        2 HEART ATTACKS   Kidney cancer Father    Lupus Father    Depression Brother    Migraines Neg Hx    Prior to Admission medications   Medication Sig Start Date End Date Taking? Authorizing Provider  doxazosin (CARDURA) 8 MG tablet Take 16 mg by mouth daily. 05/03/17  Yes [provider]  fluticasone (FLONASE) 50 MCG/ACT nasal spray Place 1 spray into both nostrils daily as needed for allergies. 12/15/20  Yes [provider]  HYDROmorphone (DILAUDID) 4 MG tablet Take 4 mg by mouth every 4 (four) hours as needed for severe pain or moderate pain (breakthrough pain).   Yes [provider]  ibandronate (BONIVA) 150 MG tablet Take 1 tablet by mouth every 30 (thirty) days. 11/05/20  Yes [provider]  rOPINIRole (REQUIP) 1 MG tablet Take 1 mg by mouth 2 (two) times daily as needed (restless legs). 12/06/20  Yes [provider]  sertraline (ZOLOFT) 100 MG tablet Take 150 mg by mouth daily. 05/03/17  Yes [provider]  traMADol (ULTRAM) 50 MG tablet Take 100 mg by mouth 3 (three) times daily.   Yes [provider]  traZODone (DESYREL) 100 MG tablet Take 1 tablet by mouth at bedtime as needed for sleep. 10/20/20  Yes [provider]  metoprolol succinate (TOPROL-XL) 25 MG 24 hr tablet Take 0.5 tablets (12.5 mg total) by mouth daily. Take with or immediately following a meal. 12/24/20 12/19/21  Rayford Halsted, PA-C    Physical Exam: Vitals:   12/31/20 1330 12/31/20 1430 12/31/20 1503 12/31/20 1659  BP: 105/80 117/83 122/72   Pulse: 83 63 74   Resp: (!) 21 (!) 25 (!) 24   Temp:    99.4 F (37.4 C)  TempSrc:    Oral  SpO2: 98% 99% 99%   Weight:      Height:        Constitutional: NAD, calm, comfortable Eyes: PERRL, lids and conjunctivae normal ENMT: Mucous membranes are dry.  Posterior pharynx clear of any exudate or lesions. Neck: normal, supple, no masses, no thyromegaly Respiratory: clear to auscultation  bilaterally, no wheezing, no crackles. Normal respiratory effort. No accessory muscle use.  Cardiovascular: Regular rate and rhythm, no murmurs / rubs / gallops. No extremity edema. 2+ pedal pulses. No carotid bruits.  Abdomen: No distention.  Bowel sounds positive.  Soft, no tenderness, no masses palpated. No hepatosplenomegaly. Musculoskeletal: no clubbing / cyanosis. Good ROM, no contractures. Normal muscle tone.  Skin: no rashes, lesions, ulcers on limited dermatological examination. Neurologic: CN 2-12 grossly intact. Sensation intact, DTR normal. Strength 5/5 in all 4.  Psychiatric: Normal judgment and insight.  Initially confused, but now he is alert and oriented x 3. Normal mood.   Labs on Admission: I have personally reviewed following labs and imaging studies  CBC: Recent Labs  Lab 12/31/20 1052  WBC 17.6*  NEUTROABS 15.3*  HGB 15.6  HCT 45.4  MCV 93.6  PLT 120*  Basic Metabolic Panel: Recent Labs  Lab 12/31/20 1052  NA 137  K 3.7  CL 107  CO2 20*  GLUCOSE 112*  BUN 12  CREATININE 1.13  CALCIUM 8.7*    GFR: Estimated Creatinine Clearance: 62.8 mL/min (by C-G formula based on SCr of 1.13 mg/dL).  Liver Function Tests: Recent Labs  Lab 12/31/20 1052  AST 16  ALT 13  ALKPHOS 58  BILITOT 0.7  PROT 7.1  ALBUMIN 3.9    Urine analysis:    Component Value Date/Time   COLORURINE YELLOW 12/31/2020 1053   APPEARANCEUR CLOUDY (A) 12/31/2020 1053   LABSPEC 1.014 12/31/2020 1053   PHURINE 5.0 12/31/2020 1053   GLUCOSEU NEGATIVE 12/31/2020 1053   HGBUR LARGE (A) 12/31/2020 1053   BILIRUBINUR NEGATIVE 12/31/2020 1053   KETONESUR NEGATIVE 12/31/2020 1053   PROTEINUR 30 (A) 12/31/2020 1053   NITRITE POSITIVE (A) 12/31/2020 1053   LEUKOCYTESUR LARGE (A) 12/31/2020 1053    Radiological Exams on Admission: CT Head Wo Contrast  Result Date: 12/31/2020 CLINICAL DATA:  70 year old male with altered mental status. Left side chest pain, fever 102 F. EXAM: CT  HEAD WITHOUT CONTRAST TECHNIQUE: Contiguous axial images were obtained from the base of the skull through the vertex without intravenous contrast. COMPARISON:  Brain MRI 08/17/2020. FINDINGS: Brain: Cerebral volume is stable since 2019. No midline shift, ventriculomegaly, mass effect, evidence of mass lesion, intracranial hemorrhage or evidence of cortically based acute infarction. Gray-white matter differentiation appears stable and normal for age. No cerebral edema or encephalomalacia identified. Vascular: Calcified atherosclerosis at the skull base. No suspicious intracranial vascular hyperdensity. Skull: Stable. Osteopenia. No acute osseous abnormality identified. Sinuses/Orbits: Improved paranasal sinus aeration since 2019. Scattered mild mucosal thickening and occasional bubbly opacity appears stable since June. Tympanic cavities and mastoids remain clear. Other: Visualized orbits and scalp soft tissues are within normal limits. IMPRESSION: 1. Stable since 2019 and negative for age non contrast CT appearance of the brain. 2. Mild bilateral paranasal sinus inflammation, not significantly changed since June. Electronically Signed   By: Odessa Fleming M.D.   On: 12/31/2020 11:58   DG Chest Port 1 View  Result Date: 12/31/2020 CLINICAL DATA:  fever EXAM: PORTABLE CHEST 1 VIEW COMPARISON:  None. FINDINGS: The cardiomediastinal silhouette is within normal limits. No pleural effusion. No pneumothorax. No mass or consolidation. No definite displaced rib fracture. IMPRESSION: No acute abnormality identified in the chest. No consolidative pneumonia. Electronically Signed   By: Olive Bass M.D.   On: 12/31/2020 11:42    EKG: Independently reviewed.   Assessment/Plan Principal Problem:   Sepsis secondary to UTI POA (HCC) Admit to PCU/inpatient. Continue IV fluids. Continue ceftriaxone 1 g IVPB daily. Follow-up blood culture and sensitivity.   Follow urine culture and sensitivity. Follow-up CBC and CMP in  AM.  Active Problems:   Benign prostatic hyperplasia with weak urinary stream Continue Cardura 60 mg p.o. daily.    Chronic pain syndrome Continue tramadol 100 mg p.o. every 6 hours as needed.    Restless leg syndrome Continue Retacrit as needed.    Situational mixed anxiety and depressive disorder Continue sertraline and trazodone.    Acute metabolic encephalopathy Resolve. Continue UTI treatment.    Thrombocytopenia (HCC) Monitor platelet count.    DVT prophylaxis: Lovenox SQ. Code Status:   Full code. Family Communication:   Disposition Plan:   Patient is from:  Home.  Anticipated DC to:  Home.  Anticipated DC date:  01/02/2021 or 01/03/2021.  Anticipated DC barriers: Clinical  status.  Consults called:   Admission status:  Inpatient/PCU.   Severity of Illness: High severity after presenting with altered mental status in the setting of sepsis secondary to UTI.  The patient will remain in the hospital for 48 to 72 hours for IV antibiotic therapy.  Bobette Mo MD [facility]  How to contact the [facility] Attending or Consulting provider 7A - 7P or covering provider during after hours 7P -7A, for this patient?   Check the care team in [facility] and look for a) attending/consulting [facility] provider listed and b) the [facility] team listed Log into [email or web address] and use [place]'s universal password to access. If you do not have the password, please contact the hospital operator. Locate the [facility] provider you are looking for under [facility] and page to a number that you can be directly reached. If you still have difficulty reaching the provider, please page the [facility] (Director on Call) for the Hospitalists listed on amion for assistance.  12/31/2020, 6:09 PM   This document was prepared using Dragon voice recognition software may contain some unintended transcription errors.

## 2021-01-01 DIAGNOSIS — N390 Urinary tract infection, site not specified: Secondary | ICD-10-CM | POA: Diagnosis not present

## 2021-01-01 DIAGNOSIS — A419 Sepsis, unspecified organism: Secondary | ICD-10-CM | POA: Diagnosis not present

## 2021-01-01 LAB — CBC WITH DIFFERENTIAL/PLATELET
Abs Immature Granulocytes: 0.19 10*3/uL — ABNORMAL HIGH (ref 0.00–0.07)
Basophils Absolute: 0 10*3/uL (ref 0.0–0.1)
Basophils Relative: 0 %
Eosinophils Absolute: 0 10*3/uL (ref 0.0–0.5)
Eosinophils Relative: 0 %
HCT: 39.8 % (ref 39.0–52.0)
Hemoglobin: 13.6 g/dL (ref 13.0–17.0)
Immature Granulocytes: 1 %
Lymphocytes Relative: 10 %
Lymphs Abs: 2.3 10*3/uL (ref 0.7–4.0)
MCH: 32.2 pg (ref 26.0–34.0)
MCHC: 34.2 g/dL (ref 30.0–36.0)
MCV: 94.3 fL (ref 80.0–100.0)
Monocytes Absolute: 1.7 10*3/uL — ABNORMAL HIGH (ref 0.1–1.0)
Monocytes Relative: 7 %
Neutro Abs: 18.5 10*3/uL — ABNORMAL HIGH (ref 1.7–7.7)
Neutrophils Relative %: 82 %
Platelets: 100 10*3/uL — ABNORMAL LOW (ref 150–400)
RBC: 4.22 MIL/uL (ref 4.22–5.81)
RDW: 13.1 % (ref 11.5–15.5)
WBC: 22.7 10*3/uL — ABNORMAL HIGH (ref 4.0–10.5)
nRBC: 0 % (ref 0.0–0.2)

## 2021-01-01 LAB — HIV ANTIBODY (ROUTINE TESTING W REFLEX): HIV Screen 4th Generation wRfx: NONREACTIVE

## 2021-01-01 NOTE — Progress Notes (Addendum)
Pt has unsteady gait, requires support when ambulating; walker given to assist to bathroom, pt states he moves better with walker. Will update MD and request PT consult per MD approval. SRP, RN

## 2021-01-01 NOTE — Evaluation (Signed)
Physical Therapy Evaluation-1x Patient Details Name: Carlos Short. MRN: [id number] DOB: 30-Dec-1950 Today's Date: 01/01/2021  History of Present Illness  70 yo male admitted with UTI, sepsis, AMS, DOE. Hx of mood disorder, chronic low back pain, lumbar compression fxs, AAA, hernia repair, RLS  Clinical Impression  On eval, pt was  Supv-Mod Ind for mobility. He walked ~88 feet with a RW and then another 88 feet without. Mildly unsteady but no overt LOB. Pt reported moderate back pain with activity. He felt RW provided increased stability and some pain control when mobilizing. Discussed d/c plan-pt plans to return home where he lives with his wife. He reported he has been going to OP PT. Discussed resuming OP PT tx once he feels ready. Recommend mobility with nursing and/or mobility team as able/tolerated. PT will sign off. Please reorder if mobility needs change.      Recommendations for follow up therapy are one component of a multi-disciplinary discharge planning process, led by the attending physician.  Recommendations may be updated based on patient status, additional functional criteria and insurance authorization.  Follow Up Recommendations Outpatient PT (resume OP PT once he feels ready)    Equipment Recommendations  Rolling walker with 5" wheels    Recommendations for Other Services       Precautions / Restrictions Precautions Precautions: Fall Precaution Comments: 2 falls in last 2 years Restrictions Weight Bearing Restrictions: No      Mobility  Bed Mobility Overal bed mobility: Modified Independent                  Transfers Overall transfer level: Modified independent Equipment used: None                Ambulation/Gait Ambulation/Gait assistance: Supervision Gait Distance (Feet): 176 Feet Assistive device: Rolling walker (2 wheeled);None Gait Pattern/deviations: Decreased stride length     General Gait Details: Supv for safety. No LOB.  Walked x 1 with RW, then x 1 without assistive device. Pt reported some increased back pain with ambulation.  Stairs            Wheelchair Mobility    Modified Rankin (Stroke Patients Only)       Balance Overall balance assessment: History of Falls;Mild deficits observed, not formally tested                                           Pertinent Vitals/Pain Pain Assessment: Faces Faces Pain Scale: Hurts even more Pain Location: back Pain Descriptors / Indicators: Discomfort;Sore Pain Intervention(s): Monitored during session    Home Living Family/patient expects to be discharged to:: Private residence Living Arrangements: Spouse/significant other;Children Available Help at Discharge: Family Type of Home: House       Home Layout: Two level Home Equipment: Crutches;Cane - single point      Prior Function Level of Independence: Independent         Comments: wears lumbar corset 2* compression fx.     Hand Dominance        Extremity/Trunk Assessment   Upper Extremity Assessment Upper Extremity Assessment: Overall WFL for tasks assessed    Lower Extremity Assessment Lower Extremity Assessment: Generalized weakness    Cervical / Trunk Assessment Cervical / Trunk Assessment: Normal  Communication   Communication: No difficulties  Cognition Arousal/Alertness: Awake/alert Behavior During Therapy: WFL for tasks assessed/performed Overall Cognitive Status: Within Functional Limits  for tasks assessed                                        General Comments      Exercises     Assessment/Plan    PT Assessment All further PT needs can be met in the next venue of care (OP PT)  PT Problem List Decreased strength;Decreased mobility;Decreased balance;Decreased activity tolerance;Pain       PT Treatment Interventions      PT Goals (Current goals can be found in the Care Plan section)  Acute Rehab PT Goals Patient  Stated Goal: less pain. regain independence. PT Goal Formulation: All assessment and education complete, DC therapy    Frequency     Barriers to discharge        Co-evaluation               AM-PAC PT "6 Clicks" Mobility  Outcome Measure Help needed turning from your back to your side while in a flat bed without using bedrails?: None Help needed moving from lying on your back to sitting on the side of a flat bed without using bedrails?: None Help needed moving to and from a bed to a chair (including a wheelchair)?: None Help needed standing up from a chair using your arms (e.g., wheelchair or bedside chair)?: None Help needed to walk in hospital room?: A Little Help needed climbing 3-5 steps with a railing? : A Little 6 Click Score: 22    End of Session Equipment Utilized During Treatment: Gait belt Activity Tolerance: Patient tolerated treatment well Patient left: in bed;with call bell/phone within reach   PT Visit Diagnosis: Unsteadiness on feet (R26.81);Pain;Muscle weakness (generalized) (M62.81) Pain - part of body:  (back)    Time: 9914-4458 PT Time Calculation (min) (ACUTE ONLY): 17 min   Charges:   PT Evaluation $PT Eval Low Complexity: [place], PT Acute Rehabilitation  Office: [phone] Pager: [phone]

## 2021-01-01 NOTE — Progress Notes (Signed)
PROGRESS NOTE    Carlos Fitz.  BTD:[id number] DOB: [birth year] DOA: 12/31/2020 PCP: Barbie Banner, MD   Brief Narrative:   Carlos Ebron. is a 70 y.o. male with medical history significant of anxiety, depression, hyperlipidemia, BPH, history of UTIs, restless leg syndrome who was brought to the emergency department via POV with complaints of AMS, dyspnea, pleuritic left-sided chest pain and fever of 102 F at home since early in the morning.  Assessment & Plan:   Severe sepsis secondary to UTI POA (HCC) Continue ceftriaxone x 3 days consider extend to 5 days if symptoms not resolved Cultures remain pending, preliminary negative Leukocytosis ongoing   Acute metabolic encephalopathy secondary to above, POA, resolving Likely secondary to above, patient awake alert oriented to person place and situation today, somewhat sluggish to answer orientation questions but does answer appropriately   Benign prostatic hyperplasia with weak urinary stream Continue Cardura 60 mg p.o. daily.   Chronic pain syndrome Continue tramadol 100 mg p.o. every 6 hours as needed; avoid narcotics in the setting of above   Restless leg syndrome Continue Retacrit as needed.   Situational mixed anxiety and depressive disorder Continue sertraline and trazodone.   Thrombocytopenia (HCC) Likely secondary to acute infection, follow repeat labs, no signs or symptoms of bleeding at this point Currently on Lovenox for DVT prophylaxis, if platelets continue to drop we will consider holding until resolution   DVT prophylaxis:      Lovenox SQ. Code Status:              Full code. Family Communication: Wife at bedside  Status is: Inpatient  Dispo: The patient is from: Home              Anticipated d/c is to: To be determined              Anticipated d/c date is: 24 to 48 hours              Patient currently not medically stable for discharge  Consultants:  None  Procedures:   None  Antimicrobials:  Ceftriaxone x3 days  Subjective: No acute issues or events overnight denies nausea vomiting diarrhea constipation headache fevers chills or chest pain  Objective: Vitals:   12/31/20 1904 12/31/20 1912 12/31/20 2316 01/01/21 0329  BP:  137/77 123/67 127/75  Pulse:  76 87 89  Resp:  18 16 16   Temp:  99.8 F (37.7 C) 98.7 F (37.1 C) 98.8 F (37.1 C)  TempSrc:  Oral Oral Oral  SpO2:  96% 97% 97%  Weight: 87.5 kg     Height:        Intake/Output Summary (Last 24 hours) at 01/01/2021 0735 Last data filed at 01/01/2021 0300 Gross per 24 hour  Intake 120 ml  Output --  Net 120 ml   Filed Weights   12/31/20 1117 12/31/20 1904  Weight: 87 kg 87.5 kg    Examination:  General exam: Appears calm and comfortable  Respiratory system: Clear to auscultation. Respiratory effort normal. Cardiovascular system: S1 & S2 heard, RRR. No JVD, murmurs, rubs, gallops or clicks. No pedal edema. Gastrointestinal system: Abdomen is nondistended, soft and nontender. No organomegaly or masses felt. Normal bowel sounds heard. Central nervous system: Alert and oriented. No focal neurological deficits. Extremities: Symmetric 5 x 5 power. Skin: No rashes, lesions or ulcers Psychiatry: Judgement and insight appear normal. Mood & affect appropriate.     Data Reviewed: I have personally reviewed following labs  and imaging studies  CBC: Recent Labs  Lab 12/31/20 1052 01/01/21 0428  WBC 17.6* 22.7*  NEUTROABS 15.3* 18.5*  HGB 15.6 13.6  HCT 45.4 39.8  MCV 93.6 94.3  PLT 120* 100*   Basic Metabolic Panel: Recent Labs  Lab 12/31/20 1052  NA 137  K 3.7  CL 107  CO2 20*  GLUCOSE 112*  BUN 12  CREATININE 1.13  CALCIUM 8.7*   GFR: Estimated Creatinine Clearance: 62.8 mL/min (by C-G formula based on SCr of 1.13 mg/dL). Liver Function Tests: Recent Labs  Lab 12/31/20 1052  AST 16  ALT 13  ALKPHOS 58  BILITOT 0.7  PROT 7.1  ALBUMIN 3.9   No results for  input(s): LIPASE, AMYLASE in the last 168 hours. No results for input(s): AMMONIA in the last 168 hours. Coagulation Profile: No results for input(s): INR, PROTIME in the last 168 hours. Cardiac Enzymes: No results for input(s): CKTOTAL, CKMB, CKMBINDEX, TROPONINI in the last 168 hours. BNP (last 3 results) No results for input(s): PROBNP in the last 8760 hours. HbA1C: No results for input(s): HGBA1C in the last 72 hours. CBG: No results for input(s): GLUCAP in the last 168 hours. Lipid Profile: No results for input(s): CHOL, HDL, LDLCALC, TRIG, CHOLHDL, LDLDIRECT in the last 72 hours. Thyroid Function Tests: No results for input(s): TSH, T4TOTAL, FREET4, T3FREE, THYROIDAB in the last 72 hours. Anemia Panel: No results for input(s): VITAMINB12, FOLATE, FERRITIN, TIBC, IRON, RETICCTPCT in the last 72 hours. Sepsis Labs: Recent Labs  Lab 12/31/20 1053  LATICACIDVEN 1.7    Recent Results (from the past 240 hour(s))  Resp Panel by RT-PCR (Flu A&B, Covid)     Status: None   Collection Time: 12/31/20 10:53 AM   Specimen: Nasopharyngeal(NP) swabs in vial transport medium  Result Value Ref Range Status   SARS Coronavirus 2 by RT PCR NEGATIVE NEGATIVE Final    Comment: (NOTE) SARS-CoV-2 target nucleic acids are NOT DETECTED.  The SARS-CoV-2 RNA is generally detectable in upper respiratory specimens during the acute phase of infection. The lowest concentration of SARS-CoV-2 viral copies this assay can detect is 138 copies/mL. A negative result does not preclude SARS-Cov-2 infection and should not be used as the sole basis for treatment or other patient management decisions. A negative result may occur with  improper specimen collection/handling, submission of specimen other than nasopharyngeal swab, presence of viral mutation(s) within the areas targeted by this assay, and inadequate number of viral copies(<138 copies/mL). A negative result must be combined with clinical  observations, patient history, and epidemiological information. The expected result is Negative.  Fact Sheet for Patients:  BloggerCourse.com  Fact Sheet for Healthcare Providers:  SeriousBroker.it  This test is no t yet approved or cleared by the [place] FDA and  has been authorized for detection and/or diagnosis of SARS-CoV-2 by FDA under an Emergency Use Authorization (EUA). This EUA will remain  in effect (meaning this test can be used) for the duration of the COVID-19 declaration under Section 564(b)(1) of the Act, 21 U.S.C.section 360bbb-3(b)(1), unless the authorization is terminated  or revoked sooner.       Influenza A by PCR NEGATIVE NEGATIVE Final   Influenza B by PCR NEGATIVE NEGATIVE Final    Comment: (NOTE) The Xpert Xpress SARS-CoV-2/FLU/RSV plus assay is intended as an aid in the diagnosis of influenza from Nasopharyngeal swab specimens and should not be used as a sole basis for treatment. Nasal washings and aspirates are unacceptable for Xpert Xpress SARS-CoV-2/FLU/RSV  testing.  Fact Sheet for Patients: BloggerCourse.com  Fact Sheet for Healthcare Providers: SeriousBroker.it  This test is not yet approved or cleared by the [place] FDA and has been authorized for detection and/or diagnosis of SARS-CoV-2 by FDA under an Emergency Use Authorization (EUA). This EUA will remain in effect (meaning this test can be used) for the duration of the COVID-19 declaration under Section 564(b)(1) of the Act, 21 U.S.C. section 360bbb-3(b)(1), unless the authorization is terminated or revoked.  Performed at [facility], [address], [place], [place] [address]          Radiology Studies: CT Head Wo Contrast  Result Date: 12/31/2020 CLINICAL DATA:  70 year old male with altered mental status. Left side chest pain, fever 102 F. EXAM:  CT HEAD WITHOUT CONTRAST TECHNIQUE: Contiguous axial images were obtained from the base of the skull through the vertex without intravenous contrast. COMPARISON:  Brain MRI 08/17/2020. FINDINGS: Brain: Cerebral volume is stable since 2019. No midline shift, ventriculomegaly, mass effect, evidence of mass lesion, intracranial hemorrhage or evidence of cortically based acute infarction. Gray-white matter differentiation appears stable and normal for age. No cerebral edema or encephalomalacia identified. Vascular: Calcified atherosclerosis at the skull base. No suspicious intracranial vascular hyperdensity. Skull: Stable. Osteopenia. No acute osseous abnormality identified. Sinuses/Orbits: Improved paranasal sinus aeration since 2019. Scattered mild mucosal thickening and occasional bubbly opacity appears stable since June. Tympanic cavities and mastoids remain clear. Other: Visualized orbits and scalp soft tissues are within normal limits. IMPRESSION: 1. Stable since 2019 and negative for age non contrast CT appearance of the brain. 2. Mild bilateral paranasal sinus inflammation, not significantly changed since June. Electronically Signed   By: Odessa Fleming M.D.   On: 12/31/2020 11:58   DG Chest Port 1 View  Result Date: 12/31/2020 CLINICAL DATA:  fever EXAM: PORTABLE CHEST 1 VIEW COMPARISON:  None. FINDINGS: The cardiomediastinal silhouette is within normal limits. No pleural effusion. No pneumothorax. No mass or consolidation. No definite displaced rib fracture. IMPRESSION: No acute abnormality identified in the chest. No consolidative pneumonia. Electronically Signed   By: Olive Bass M.D.   On: 12/31/2020 11:42        Scheduled Meds:  doxazosin  16 mg Oral Daily   enoxaparin (LOVENOX) injection  40 mg Subcutaneous Q24H   metoprolol succinate  12.5 mg Oral Daily   sertraline  150 mg Oral Daily   Continuous Infusions:  cefTRIAXone (ROCEPHIN)  IV       LOS: 1 day   Time spent:  Azucena Fallen, DO [facility]  If 7PM-7AM, please contact night-coverage [email or web address]  01/01/2021, 7:35 AM

## 2021-01-01 NOTE — Plan of Care (Signed)
  Problem: Clinical Measurements: Goal: Ability to maintain clinical measurements within normal limits will improve 01/01/2021 0842 by Charmian Muff, RN Outcome: Progressing 01/01/2021 0841 by Charmian Muff, RN Outcome: Progressing Goal: Will remain free from infection 01/01/2021 0842 by Charmian Muff, RN Outcome: Progressing 01/01/2021 0841 by Charmian Muff, RN Outcome: Progressing   Problem: Activity: Goal: Risk for activity intolerance will decrease Outcome: Progressing

## 2021-01-01 NOTE — Progress Notes (Signed)
C/O of nausea, vomited yellow emesis, Zofran given, along with crackers and gingerale. Will cont to monitor. SRP, RN

## 2021-01-01 NOTE — Plan of Care (Signed)
  Problem: Clinical Measurements: Goal: Ability to maintain clinical measurements within normal limits will improve Outcome: Progressing Goal: Will remain free from infection Outcome: Progressing   Problem: Activity: Goal: Risk for activity intolerance will decrease Outcome: Progressing   

## 2021-01-02 DIAGNOSIS — A419 Sepsis, unspecified organism: Secondary | ICD-10-CM | POA: Diagnosis not present

## 2021-01-02 DIAGNOSIS — N390 Urinary tract infection, site not specified: Secondary | ICD-10-CM | POA: Diagnosis not present

## 2021-01-02 MED ORDER — POLYETHYLENE GLYCOL 3350 17 G PO PACK
17.0000 g | PACK | Freq: Every day | ORAL | Status: DC
  Administered 2021-01-02 – 2021-01-03 (×2): 17 g via ORAL
  Filled 2021-01-02 (×2): qty 1

## 2021-01-02 NOTE — Progress Notes (Signed)
   01/02/21 1100  Mobility  Activity Ambulated in hall  Level of Assistance Standby assist, set-up cues, supervision of patient - no hands on  Assistive Device Front wheel walker  Distance Ambulated (ft) 500 ft  Mobility Ambulated with assistance in hallway  Mobility Response Tolerated well  Mobility performed by Mobility specialist  $Mobility charge 1 Mobility   Pt agreeable to mobilize this morning. Ambulated about 477ft with RW in hall, tolerated well. He did state he was feeling SOB while ambulating towards the end of the session, and wants to "gain endurance". Assured pt that he will remain on MS list while he's here so this can be accomplished. Left pt in bed with call bell at side and family member present. Family member and pt curious when MD will be in to talk with them and they have a few questions. Told pt RN would be notified of concern. RN notified of session and request to see MD.    Timoteo Expose Mobility Specialist Acute Rehab Services Office: [phone]

## 2021-01-02 NOTE — Progress Notes (Signed)
PROGRESS NOTE    Carlos Fitz.  AOZ:[id number] DOB: 1950-08-29 DOA: 12/31/2020 PCP: Barbie Banner, MD   Brief Narrative:   Carlos Hodapp. is a 70 y.o. male with medical history significant of anxiety, depression, hyperlipidemia, BPH, history of UTIs, restless leg syndrome who was brought to the emergency department via POV with complaints of AMS, dyspnea, pleuritic left-sided chest pain and fever of 102 F at home since early in the morning.  Assessment & Plan:   Severe sepsis secondary to UTI POA (HCC) Continue ceftriaxone x 3 days consider extend to 5 days if symptoms not resolved Cultures remain pending, preliminary negative Leukocytosis ongoing   Acute metabolic encephalopathy secondary to above, POA, resolving Likely secondary to above, patient awake alert oriented to person place and situation today, somewhat sluggish to answer orientation questions but does answer appropriately   Benign prostatic hyperplasia with weak urinary stream Continue Cardura 60 mg p.o. daily.   Chronic pain syndrome Continue tramadol 100 mg p.o. every 6 hours as needed; avoid narcotics in the setting of above   Restless leg syndrome Continue requip as needed.   Situational mixed anxiety and depressive disorder Continue sertraline and trazodone.   Thrombocytopenia (HCC) Likely secondary to acute infection, follow repeat labs, no signs or symptoms of bleeding at this point Currently on Lovenox for DVT prophylaxis, if platelets continue to drop we will consider holding until resolution   Ambulatory dysfunction,weakness Secondary to above - PT recommending outpatient PT follow up  DVT prophylaxis:      Lovenox SQ. Code Status:              Full code. Family Communication: Wife at bedside  Status is: Inpatient  Dispo: The patient is from: Home              Anticipated d/c is to: To be determined              Anticipated d/c date is: 24 to 48 hours              Patient  currently not medically stable for discharge  Consultants:  None  Procedures:  None  Antimicrobials:  Ceftriaxone x3 days  Subjective: No acute issues or events overnight denies nausea vomiting diarrhea constipation headache fevers chills or chest pain  Objective: Vitals:   01/01/21 0329 01/01/21 0733 01/01/21 1947 01/02/21 0500  BP: 127/75 114/79 119/68 (!) 121/58  Pulse: 89 68 (!) 41 75  Resp: 16 18 19 20   Temp: 98.8 F (37.1 C) 98.5 F (36.9 C) 98.4 F (36.9 C) 97.6 F (36.4 C)  TempSrc: Oral Oral Oral Oral  SpO2: 97% 99% 97% 99%  Weight:      Height:        Intake/Output Summary (Last 24 hours) at 01/02/2021 0800 Last data filed at 01/01/2021 1334 Gross per 24 hour  Intake 0 ml  Output --  Net 0 ml    Filed Weights   12/31/20 1117 12/31/20 1904  Weight: 87 kg 87.5 kg    Examination:  General exam: Appears calm and comfortable  Respiratory system: Clear to auscultation. Respiratory effort normal. Cardiovascular system: S1 & S2 heard, RRR. No JVD, murmurs, rubs, gallops or clicks. No pedal edema. Gastrointestinal system: Abdomen is nondistended, soft and nontender. No organomegaly or masses felt. Normal bowel sounds heard. Central nervous system: Alert and oriented. No focal neurological deficits. Extremities: Symmetric 5 x 5 power. Skin: No rashes, lesions or ulcers Psychiatry: Judgement and insight  appear normal. Mood & affect appropriate.     Data Reviewed: I have personally reviewed following labs and imaging studies  CBC: Recent Labs  Lab 12/31/20 1052 01/01/21 0428  WBC 17.6* 22.7*  NEUTROABS 15.3* 18.5*  HGB 15.6 13.6  HCT 45.4 39.8  MCV 93.6 94.3  PLT 120* 100*    Basic Metabolic Panel: Recent Labs  Lab 12/31/20 1052  NA 137  K 3.7  CL 107  CO2 20*  GLUCOSE 112*  BUN 12  CREATININE 1.13  CALCIUM 8.7*    GFR: Estimated Creatinine Clearance: 62.8 mL/min (by C-G formula based on SCr of 1.13 mg/dL). Liver Function  Tests: Recent Labs  Lab 12/31/20 1052  AST 16  ALT 13  ALKPHOS 58  BILITOT 0.7  PROT 7.1  ALBUMIN 3.9    No results for input(s): LIPASE, AMYLASE in the last 168 hours. No results for input(s): AMMONIA in the last 168 hours. Coagulation Profile: No results for input(s): INR, PROTIME in the last 168 hours. Cardiac Enzymes: No results for input(s): CKTOTAL, CKMB, CKMBINDEX, TROPONINI in the last 168 hours. BNP (last 3 results) No results for input(s): PROBNP in the last 8760 hours. HbA1C: No results for input(s): HGBA1C in the last 72 hours. CBG: No results for input(s): GLUCAP in the last 168 hours. Lipid Profile: No results for input(s): CHOL, HDL, LDLCALC, TRIG, CHOLHDL, LDLDIRECT in the last 72 hours. Thyroid Function Tests: No results for input(s): TSH, T4TOTAL, FREET4, T3FREE, THYROIDAB in the last 72 hours. Anemia Panel: No results for input(s): VITAMINB12, FOLATE, FERRITIN, TIBC, IRON, RETICCTPCT in the last 72 hours. Sepsis Labs: Recent Labs  Lab 12/31/20 1053  LATICACIDVEN 1.7     Recent Results (from the past 240 hour(s))  Culture, blood (routine x 2)     Status: None (Preliminary result)   Collection Time: 12/31/20 10:53 AM   Specimen: BLOOD  Result Value Ref Range Status   Specimen Description   Final    BLOOD LEFT ANTECUBITAL Performed at [facility], [address] [place], [place] [address]    Special Requests   Final    BOTTLES DRAWN AEROBIC AND ANAEROBIC Blood Culture adequate volume Performed at [facility], [address] [place], [place] [address]    Culture   Final    NO GROWTH < 24 HOURS Performed at [facility], [address], [place], [place] [address]    Report Status PENDING  Incomplete  Resp Panel by RT-PCR (Flu A&B, Covid)     Status: None   Collection Time: 12/31/20 10:53 AM   Specimen: Nasopharyngeal(NP) swabs in vial transport medium  Result Value Ref Range Status   SARS  Coronavirus 2 by RT PCR NEGATIVE NEGATIVE Final    Comment: (NOTE) SARS-CoV-2 target nucleic acids are NOT DETECTED.  The SARS-CoV-2 RNA is generally detectable in upper respiratory specimens during the acute phase of infection. The lowest concentration of SARS-CoV-2 viral copies this assay can detect is 138 copies/mL. A negative result does not preclude SARS-Cov-2 infection and should not be used as the sole basis for treatment or other patient management decisions. A negative result may occur with  improper specimen collection/handling, submission of specimen other than nasopharyngeal swab, presence of viral mutation(s) within the areas targeted by this assay, and inadequate number of viral copies(<138 copies/mL). A negative result must be combined with clinical observations, patient history, and epidemiological information. The expected result is Negative.  Fact Sheet for Patients:  BloggerCourse.com  Fact Sheet for Healthcare Providers:  SeriousBroker.it  This test is no t yet approved or cleared by the [place] FDA and  has been authorized for detection and/or diagnosis of SARS-CoV-2 by FDA under an Emergency Use Authorization (EUA). This EUA will remain  in effect (meaning this test can be used) for the duration of the COVID-19 declaration under Section 564(b)(1) of the Act, 21 U.S.C.section 360bbb-3(b)(1), unless the authorization is terminated  or revoked sooner.       Influenza A by PCR NEGATIVE NEGATIVE Final   Influenza B by PCR NEGATIVE NEGATIVE Final    Comment: (NOTE) The Xpert Xpress SARS-CoV-2/FLU/RSV plus assay is intended as an aid in the diagnosis of influenza from Nasopharyngeal swab specimens and should not be used as a sole basis for treatment. Nasal washings and aspirates are unacceptable for Xpert Xpress SARS-CoV-2/FLU/RSV testing.  Fact Sheet for  Patients: BloggerCourse.com  Fact Sheet for Healthcare Providers: SeriousBroker.it  This test is not yet approved or cleared by the [place] FDA and has been authorized for detection and/or diagnosis of SARS-CoV-2 by FDA under an Emergency Use Authorization (EUA). This EUA will remain in effect (meaning this test can be used) for the duration of the COVID-19 declaration under Section 564(b)(1) of the Act, 21 U.S.C. section 360bbb-3(b)(1), unless the authorization is terminated or revoked.  Performed at [facility], [address] [place], [place] [address]   Culture, blood (routine x 2)     Status: None (Preliminary result)   Collection Time: 12/31/20  7:06 PM   Specimen: BLOOD  Result Value Ref Range Status   Specimen Description   Final    BLOOD RIGHT ANTECUBITAL Performed at [facility], [address] [place], [place] [address]    Special Requests   Final    BOTTLES DRAWN AEROBIC ONLY Blood Culture adequate volume Performed at [facility], [address] [place], [place] [address]    Culture   Final    NO GROWTH < 12 HOURS Performed at [facility], [address] [place], [place] [address]    Report Status PENDING  Incomplete          Radiology Studies: CT Head Wo Contrast  Result Date: 12/31/2020 CLINICAL DATA:  70 year old male with altered mental status. Left side chest pain, fever 102 F. EXAM: CT HEAD WITHOUT CONTRAST TECHNIQUE: Contiguous axial images were obtained from the base of the skull through the vertex without intravenous contrast. COMPARISON:  Brain MRI 08/17/2020. FINDINGS: Brain: Cerebral volume is stable since 2019. No midline shift, ventriculomegaly, mass effect, evidence of mass lesion, intracranial hemorrhage or evidence of cortically based acute infarction. Gray-white matter differentiation appears stable and normal for age.  No cerebral edema or encephalomalacia identified. Vascular: Calcified atherosclerosis at the skull base. No suspicious intracranial vascular hyperdensity. Skull: Stable. Osteopenia. No acute osseous abnormality identified. Sinuses/Orbits: Improved paranasal sinus aeration since 2019. Scattered mild mucosal thickening and occasional bubbly opacity appears stable since June. Tympanic cavities and mastoids remain clear. Other: Visualized orbits and scalp soft tissues are within normal limits. IMPRESSION: 1. Stable since 2019 and negative for age non contrast CT appearance of the brain. 2. Mild bilateral paranasal sinus inflammation, not significantly changed since June. Electronically Signed   By: Carlos Short M.D.   On: 12/31/2020 11:58   DG Chest Port 1 View  Result Date: 12/31/2020 CLINICAL DATA:  fever EXAM: PORTABLE CHEST 1 VIEW COMPARISON:  None. FINDINGS: The cardiomediastinal  silhouette is within normal limits. No pleural effusion. No pneumothorax. No mass or consolidation. No definite displaced rib fracture. IMPRESSION: No acute abnormality identified in the chest. No consolidative pneumonia. Electronically Signed   By: Olive Bass M.D.   On: 12/31/2020 11:42        Scheduled Meds:  doxazosin  16 mg Oral Daily   enoxaparin (LOVENOX) injection  40 mg Subcutaneous Q24H   metoprolol succinate  12.5 mg Oral Daily   sertraline  150 mg Oral Daily   Continuous Infusions:  cefTRIAXone (ROCEPHIN)  IV 1 g (01/01/21 1334)     LOS: 2 days   Time spent:  Azucena Fallen, DO [facility]  If 7PM-7AM, please contact night-coverage [email or web address]  01/02/2021, 8:00 AM

## 2021-01-03 DIAGNOSIS — A419 Sepsis, unspecified organism: Secondary | ICD-10-CM | POA: Diagnosis not present

## 2021-01-03 DIAGNOSIS — N390 Urinary tract infection, site not specified: Secondary | ICD-10-CM | POA: Diagnosis not present

## 2021-01-03 LAB — CBC
HCT: 40.4 % (ref 39.0–52.0)
Hemoglobin: 13.6 g/dL (ref 13.0–17.0)
MCH: 32.4 pg (ref 26.0–34.0)
MCHC: 33.7 g/dL (ref 30.0–36.0)
MCV: 96.2 fL (ref 80.0–100.0)
Platelets: 110 10*3/uL — ABNORMAL LOW (ref 150–400)
RBC: 4.2 MIL/uL — ABNORMAL LOW (ref 4.22–5.81)
RDW: 13.2 % (ref 11.5–15.5)
WBC: 10.3 10*3/uL (ref 4.0–10.5)
nRBC: 0 % (ref 0.0–0.2)

## 2021-01-03 LAB — BASIC METABOLIC PANEL
Anion gap: 4 — ABNORMAL LOW (ref 5–15)
BUN: 15 mg/dL (ref 8–23)
CO2: 26 mmol/L (ref 22–32)
Calcium: 8.1 mg/dL — ABNORMAL LOW (ref 8.9–10.3)
Chloride: 108 mmol/L (ref 98–111)
Creatinine, Ser: 0.98 mg/dL (ref 0.61–1.24)
GFR, Estimated: >60 mL/min (ref >60)
Glucose, Bld: 95 mg/dL (ref 70–99)
Potassium: 3.3 mmol/L — ABNORMAL LOW (ref 3.5–5.1)
Sodium: 138 mmol/L (ref 135–145)

## 2021-01-03 MED ORDER — CEPHALEXIN 250 MG PO CAPS: 250.0000 mg | ORAL_CAPSULE | Freq: Four times a day (QID) | ORAL | 0 refills | Status: AC

## 2021-01-03 MED ORDER — POTASSIUM CHLORIDE CRYS ER 20 MEQ PO TBCR
40.0000 meq | EXTENDED_RELEASE_TABLET | Freq: Once | ORAL | Status: AC
  Administered 2021-01-03: 40 meq via ORAL
  Filled 2021-01-03: qty 2

## 2021-01-03 NOTE — Progress Notes (Signed)
   01/03/21 1246  Mobility  Activity Ambulated in hall  Level of Assistance Modified independent, requires aide device or extra time  Assistive Device Front wheel walker  Distance Ambulated (ft) 450 ft  Mobility Ambulated with assistance in hallway  Mobility Response Tolerated well  Mobility performed by Mobility specialist  $Mobility charge 1 Mobility   Upon entering, RN was discussing d/c instructions with pt. He was agreeable to mobilizing this morning. Pt decided to utilize RW for session, although he states he does not feel he needs one at baseline. Ambulated about 416ft in hall with RW, tolerated well. He did not some lower back pain at the end of the session, but believes this is secondary to a previous injury. Left pt in bed with call bell at side. RN notified of session.   Timoteo Expose Mobility Specialist Acute Rehab Services Office: [phone]

## 2021-01-03 NOTE — Progress Notes (Signed)
Discharge instructions given to patient, reviewed with patient and patient's wife at bedside.  Patient and wife verbalized understanding.  PIV removed.  Patient escorted to main entrance via wheelchair for transport home with wife.  Bradd Burner, RN

## 2021-01-03 NOTE — Plan of Care (Signed)
  Problem: Clinical Measurements: Goal: Ability to maintain clinical measurements within normal limits will improve Outcome: Adequate for Discharge Goal: Will remain free from infection Outcome: Adequate for Discharge   Problem: Activity: Goal: Risk for activity intolerance will decrease Outcome: Adequate for Discharge   Problem: Pain Managment: Goal: General experience of comfort will improve Outcome: Adequate for Discharge   Problem: Safety: Goal: Ability to remain free from injury will improve Outcome: Adequate for Discharge

## 2021-01-03 NOTE — Discharge Summary (Signed)
Physician Discharge Summary  Carlos Short. YWV:[id number] DOB: 31-Dec-1950 DOA: 12/31/2020  PCP: Barbie Banner, MD  Admit date: 12/31/2020 Discharge date: 01/03/2021  Admitted From: Home Disposition:  Home  Recommendations for Outpatient Follow-up:  Follow up with PCP in 1-2 weeks Please obtain BMP/CBC in one week  Discharge Condition: Stable CODE STATUS: Full Diet recommendation: As tolerated  Brief/Interim Summary: Carlos Short. is a 70 y.o. male with medical history significant of anxiety, depression, hyperlipidemia, BPH, history of UTIs, restless leg syndrome who was brought to the emergency department via POV with complaints of AMS, dyspnea, pleuritic left-sided chest pain and fever of 102 F at home.  Patient mated for acute UTI with acute metabolic encephalopathy, resolving with antibiotics, transition to p.o. today with Keflex for remainder of course and follow-up with PCP later this week.  Patient otherwise stable and agreeable for discharge.   Assessment & Plan:  Severe sepsis secondary to UTI POA (HCC) Transition to Keflex at discharge Cultures negative thus far   Acute metabolic encephalopathy secondary to above, POA, resolved Back to baseline   Benign prostatic hyperplasia with weak urinary stream Continue Cardura 60 mg p.o. daily.   Chronic pain syndrome Continue tramadol 100 mg p.o. every 6 hours as needed; avoid narcotics in the setting of above   Restless leg syndrome Continue requip as needed.   Situational mixed anxiety and depressive disorder Continue sertraline and trazodone.   Thrombocytopenia (HCC) Likely secondary to acute infection, follow repeat labs, no signs or symptoms of bleeding at this point Currently on Lovenox for DVT prophylaxis, if platelets continue to drop we will consider holding until resolution    Ambulatory dysfunction,weakness Secondary to above -resolved, outpatient PT follow-up as indicated; patient feels  much more stable not requiring assistive device at this point  Discharge Diagnoses:  Principal Problem:   Sepsis secondary to UTI [facility]) Active Problems:   Benign prostatic hyperplasia with weak urinary stream   Chronic pain syndrome   Restless leg syndrome   Situational mixed anxiety and depressive disorder   Acute metabolic encephalopathy   Thrombocytopenia (HCC)    Discharge Instructions  Discharge Instructions     Call MD for:  severe uncontrolled pain   Complete by: As directed    Call MD for:  temperature >100.4   Complete by: As directed    Diet - low sodium heart healthy   Complete by: As directed    Increase activity slowly   Complete by: As directed       Allergies as of 01/03/2021       Reactions   Gabapentin    Other reaction(s): Other (See Comments) crazy        Medication List     STOP taking these medications    traZODone 100 MG tablet Commonly known as: DESYREL       TAKE these medications    cephALEXin 250 MG capsule Commonly known as: KEFLEX Take 1 capsule (250 mg total) by mouth 4 (four) times daily for 2 days.   doxazosin 8 MG tablet Commonly known as: CARDURA Take 16 mg by mouth daily.   fluticasone 50 MCG/ACT nasal spray Commonly known as: FLONASE Place 1 spray into both nostrils daily as needed for allergies.   HYDROmorphone 4 MG tablet Commonly known as: DILAUDID Take 4 mg by mouth every 4 (four) hours as needed for severe pain or moderate pain (breakthrough pain).   ibandronate 150 MG tablet Commonly known as: BONIVA Take 1  tablet by mouth every 30 (thirty) days.   metoprolol succinate 25 MG 24 hr tablet Commonly known as: TOPROL-XL Take 0.5 tablets (12.5 mg total) by mouth daily. Take with or immediately following a meal.   rOPINIRole 1 MG tablet Commonly known as: REQUIP Take 1 mg by mouth 2 (two) times daily as needed (restless legs).   sertraline 100 MG tablet Commonly known as: ZOLOFT Take 150 mg by mouth  daily.   traMADol 50 MG tablet Commonly known as: ULTRAM Take 100 mg by mouth 3 (three) times daily.        Allergies  Allergen Reactions   Gabapentin     Other reaction(s): Other (See Comments) crazy    Consultations: None  Procedures/Studies: CT Head Wo Contrast  Result Date: 12/31/2020 CLINICAL DATA:  70 year old male with altered mental status. Left side chest pain, fever 102 F. EXAM: CT HEAD WITHOUT CONTRAST TECHNIQUE: Contiguous axial images were obtained from the base of the skull through the vertex without intravenous contrast. COMPARISON:  Brain MRI 08/17/2020. FINDINGS: Brain: Cerebral volume is stable since 2019. No midline shift, ventriculomegaly, mass effect, evidence of mass lesion, intracranial hemorrhage or evidence of cortically based acute infarction. Gray-white matter differentiation appears stable and normal for age. No cerebral edema or encephalomalacia identified. Vascular: Calcified atherosclerosis at the skull base. No suspicious intracranial vascular hyperdensity. Skull: Stable. Osteopenia. No acute osseous abnormality identified. Sinuses/Orbits: Improved paranasal sinus aeration since 2019. Scattered mild mucosal thickening and occasional bubbly opacity appears stable since June. Tympanic cavities and mastoids remain clear. Other: Visualized orbits and scalp soft tissues are within normal limits. IMPRESSION: 1. Stable since 2019 and negative for age non contrast CT appearance of the brain. 2. Mild bilateral paranasal sinus inflammation, not significantly changed since June. Electronically Signed   By: Odessa Fleming M.D.   On: 12/31/2020 11:58   DG Chest Port 1 View  Result Date: 12/31/2020 CLINICAL DATA:  fever EXAM: PORTABLE CHEST 1 VIEW COMPARISON:  None. FINDINGS: The cardiomediastinal silhouette is within normal limits. No pleural effusion. No pneumothorax. No mass or consolidation. No definite displaced rib fracture. IMPRESSION: No acute abnormality identified in  the chest. No consolidative pneumonia. Electronically Signed   By: Olive Bass M.D.   On: 12/31/2020 11:42   PCV AORTA DUPLEX  Result Date: 01/02/2021 Abdominal Aortic Duplex 12/30/2020: Moderate dilatation of the abdominal aorta is noted in the proximal and mid and distal aorta. Mild plaque noted in the proximal, mid and distal aorta. An abdominal aortic aneurysm measuring 4.14 x 4.08 x 4.19 cm is seenin the proximal aorta. .No septation noted. Mild plaque noted in the right internal iliac and left internal iliac IIA. There is a large cystic mass (appears to be simple cyst) in the suprapubic portion of the abdomen. Consider dedicated abdominal ultrasound for further delineation. Recheck [place] in 1 year for f/u of AAA. See enclosed image.     Subjective: No acute issues or events overnight denies nausea vomiting diarrhea constipation headache fevers chills chest pain shortness of breath   Discharge Exam: Vitals:   01/02/21 1958 01/03/21 0512  BP: 119/82 119/83  Pulse: 92 (!) 48  Resp: 18 16  Temp: 98.2 F (36.8 C) 98.2 F (36.8 C)  SpO2: 96% 98%   Vitals:   01/02/21 0500 01/02/21 1749 01/02/21 1958 01/03/21 0512  BP: (!) 121/58 130/89 119/82 119/83  Pulse: 75 74 92 (!) 48  Resp: 20 18 18 16   Temp: 97.6 F (36.4 C) 98.5 F (36.9  C) 98.2 F (36.8 C) 98.2 F (36.8 C)  TempSrc: Oral Oral Oral Oral  SpO2: 99% 96% 96% 98%  Weight:      Height:        General: Pt is alert, awake, not in acute distress Cardiovascular: RRR, S1/S2 +, no rubs, no gallops Respiratory: CTA bilaterally, no wheezing, no rhonchi Abdominal: Soft, NT, ND, bowel sounds + Extremities: no edema, no cyanosis    The results of significant diagnostics from this hospitalization (including imaging, microbiology, ancillary and laboratory) are listed below for reference.     Microbiology: Recent Results (from the past 240 hour(s))  Culture, blood (routine x 2)     Status: None (Preliminary result)    Collection Time: 12/31/20 10:53 AM   Specimen: BLOOD  Result Value Ref Range Status   Specimen Description   Final    BLOOD LEFT ANTECUBITAL Performed at [facility], [address], [place], [place] [address]    Special Requests   Final    BOTTLES DRAWN AEROBIC AND ANAEROBIC Blood Culture adequate volume Performed at [facility], [address] [place], [place] [address]    Culture   Final    NO GROWTH 3 DAYS Performed at [facility], [address] [place], [place] [address]    Report Status PENDING  Incomplete  Resp Panel by RT-PCR (Flu A&B, Covid)     Status: None   Collection Time: 12/31/20 10:53 AM   Specimen: Nasopharyngeal(NP) swabs in vial transport medium  Result Value Ref Range Status   SARS Coronavirus 2 by RT PCR NEGATIVE NEGATIVE Final    Comment: (NOTE) SARS-CoV-2 target nucleic acids are NOT DETECTED.  The SARS-CoV-2 RNA is generally detectable in upper respiratory specimens during the acute phase of infection. The lowest concentration of SARS-CoV-2 viral copies this assay can detect is 138 copies/mL. A negative result does not preclude SARS-Cov-2 infection and should not be used as the sole basis for treatment or other patient management decisions. A negative result may occur with  improper specimen collection/handling, submission of specimen other than nasopharyngeal swab, presence of viral mutation(s) within the areas targeted by this assay, and inadequate number of viral copies(<138 copies/mL). A negative result must be combined with clinical observations, patient history, and epidemiological information. The expected result is Negative.  Fact Sheet for Patients:  BloggerCourse.com  Fact Sheet for Healthcare Providers:  SeriousBroker.it  This test is no t yet approved or cleared by the [place] FDA and  has been authorized for detection and/or  diagnosis of SARS-CoV-2 by FDA under an Emergency Use Authorization (EUA). This EUA will remain  in effect (meaning this test can be used) for the duration of the COVID-19 declaration under Section 564(b)(1) of the Act, 21 U.S.C.section 360bbb-3(b)(1), unless the authorization is terminated  or revoked sooner.       Influenza A by PCR NEGATIVE NEGATIVE Final   Influenza B by PCR NEGATIVE NEGATIVE Final    Comment: (NOTE) The Xpert Xpress SARS-CoV-2/FLU/RSV plus assay is intended as an aid in the diagnosis of influenza from Nasopharyngeal swab specimens and should not be used as a sole basis for treatment. Nasal washings and aspirates are unacceptable for Xpert Xpress SARS-CoV-2/FLU/RSV testing.  Fact Sheet for Patients: BloggerCourse.com  Fact Sheet for Healthcare Providers: SeriousBroker.it  This test is not yet approved or cleared by the [place] FDA and has been authorized for detection and/or diagnosis of SARS-CoV-2 by FDA under an Emergency Use  Authorization (EUA). This EUA will remain in effect (meaning this test can be used) for the duration of the COVID-19 declaration under Section 564(b)(1) of the Act, 21 U.S.C. section 360bbb-3(b)(1), unless the authorization is terminated or revoked.  Performed at [facility], [address], [place], [place] [address]   Culture, blood (routine x 2)     Status: None (Preliminary result)   Collection Time: 12/31/20  7:06 PM   Specimen: BLOOD  Result Value Ref Range Status   Specimen Description   Final    BLOOD RIGHT ANTECUBITAL Performed at [facility], [address], [place], [place] [address]    Special Requests   Final    BOTTLES DRAWN AEROBIC ONLY Blood Culture adequate volume Performed at [facility], [address] [place], [place] [address]    Culture   Final    NO GROWTH 3 DAYS Performed at [facility], [address] [place], [place] [address]    Report Status PENDING  Incomplete     Labs: BNP (last 3 results) No results for input(s): BNP in the last 8760 hours. Basic Metabolic Panel: Recent Labs  Lab 12/31/20 1052 01/03/21 0330  NA 137 138  K 3.7 3.3*  CL 107 108  CO2 20* 26  GLUCOSE 112* 95  BUN 12 15  CREATININE 1.13 0.98  CALCIUM 8.7* 8.1*   Liver Function Tests: Recent Labs  Lab 12/31/20 1052  AST 16  ALT 13  ALKPHOS 58  BILITOT 0.7  PROT 7.1  ALBUMIN 3.9   No results for input(s): LIPASE, AMYLASE in the last 168 hours. No results for input(s): AMMONIA in the last 168 hours. CBC: Recent Labs  Lab 12/31/20 1052 01/01/21 0428 01/03/21 0330  WBC 17.6* 22.7* 10.3  NEUTROABS 15.3* 18.5*  --   HGB 15.6 13.6 13.6  HCT 45.4 39.8 40.4  MCV 93.6 94.3 96.2  PLT 120* 100* 110*   Cardiac Enzymes: No results for input(s): CKTOTAL, CKMB, CKMBINDEX, TROPONINI in the last 168 hours. BNP: Invalid input(s): POCBNP CBG: No results for input(s): GLUCAP in the last 168 hours. D-Dimer No results for input(s): DDIMER in the last 72 hours. Hgb A1c No results for input(s): HGBA1C in the last 72 hours. Lipid Profile No results for input(s): CHOL, HDL, LDLCALC, TRIG, CHOLHDL, LDLDIRECT in the last 72 hours. Thyroid function studies No results for input(s): TSH, T4TOTAL, T3FREE, THYROIDAB in the last 72 hours.  Invalid input(s): FREET3 Anemia work up No results for input(s): VITAMINB12, FOLATE, FERRITIN, TIBC, IRON, RETICCTPCT in the last 72 hours. Urinalysis    Component Value Date/Time   COLORURINE YELLOW 12/31/2020 1053   APPEARANCEUR CLOUDY (A) 12/31/2020 1053   LABSPEC 1.014 12/31/2020 1053   PHURINE 5.0 12/31/2020 1053   GLUCOSEU NEGATIVE 12/31/2020 1053   HGBUR LARGE (A) 12/31/2020 1053   BILIRUBINUR NEGATIVE 12/31/2020 1053   KETONESUR NEGATIVE 12/31/2020 1053   PROTEINUR 30 (A) 12/31/2020 1053   NITRITE POSITIVE (A) 12/31/2020 1053    LEUKOCYTESUR LARGE (A) 12/31/2020 1053   Sepsis Labs Invalid input(s): PROCALCITONIN,  WBC,  LACTICIDVEN Microbiology Recent Results (from the past 240 hour(s))  Culture, blood (routine x 2)     Status: None (Preliminary result)   Collection Time: 12/31/20 10:53 AM   Specimen: BLOOD  Result Value Ref Range Status   Specimen Description   Final    BLOOD LEFT ANTECUBITAL Performed at [facility], [address] [place], [place]  [address]    Special Requests   Final    BOTTLES DRAWN AEROBIC AND ANAEROBIC Blood Culture adequate volume Performed at [facility], [address], [place], [place] [address]    Culture   Final    NO GROWTH 3 DAYS Performed at [facility], [address], [place], [place] [address]    Report Status PENDING  Incomplete  Resp Panel by RT-PCR (Flu A&B, Covid)     Status: None   Collection Time: 12/31/20 10:53 AM   Specimen: Nasopharyngeal(NP) swabs in vial transport medium  Result Value Ref Range Status   SARS Coronavirus 2 by RT PCR NEGATIVE NEGATIVE Final    Comment: (NOTE) SARS-CoV-2 target nucleic acids are NOT DETECTED.  The SARS-CoV-2 RNA is generally detectable in upper respiratory specimens during the acute phase of infection. The lowest concentration of SARS-CoV-2 viral copies this assay can detect is 138 copies/mL. A negative result does not preclude SARS-Cov-2 infection and should not be used as the sole basis for treatment or other patient management decisions. A negative result may occur with  improper specimen collection/handling, submission of specimen other than nasopharyngeal swab, presence of viral mutation(s) within the areas targeted by this assay, and inadequate number of viral copies(<138 copies/mL). A negative result must be combined with clinical observations, patient history, and epidemiological information. The expected result is Negative.  Fact Sheet for Patients:   BloggerCourse.com  Fact Sheet for Healthcare Providers:  SeriousBroker.it  This test is no t yet approved or cleared by the [place] FDA and  has been authorized for detection and/or diagnosis of SARS-CoV-2 by FDA under an Emergency Use Authorization (EUA). This EUA will remain  in effect (meaning this test can be used) for the duration of the COVID-19 declaration under Section 564(b)(1) of the Act, 21 U.S.C.section 360bbb-3(b)(1), unless the authorization is terminated  or revoked sooner.       Influenza A by PCR NEGATIVE NEGATIVE Final   Influenza B by PCR NEGATIVE NEGATIVE Final    Comment: (NOTE) The Xpert Xpress SARS-CoV-2/FLU/RSV plus assay is intended as an aid in the diagnosis of influenza from Nasopharyngeal swab specimens and should not be used as a sole basis for treatment. Nasal washings and aspirates are unacceptable for Xpert Xpress SARS-CoV-2/FLU/RSV testing.  Fact Sheet for Patients: BloggerCourse.com  Fact Sheet for Healthcare Providers: SeriousBroker.it  This test is not yet approved or cleared by the [place] FDA and has been authorized for detection and/or diagnosis of SARS-CoV-2 by FDA under an Emergency Use Authorization (EUA). This EUA will remain in effect (meaning this test can be used) for the duration of the COVID-19 declaration under Section 564(b)(1) of the Act, 21 U.S.C. section 360bbb-3(b)(1), unless the authorization is terminated or revoked.  Performed at [facility], [address], [place], [place] [address]   Culture, blood (routine x 2)     Status: None (Preliminary result)   Collection Time: 12/31/20  7:06 PM   Specimen: BLOOD  Result Value Ref Range Status   Specimen Description   Final    BLOOD RIGHT ANTECUBITAL Performed at [facility], [address] [place], [place] [address]     Special Requests   Final    BOTTLES DRAWN AEROBIC ONLY Blood Culture adequate volume Performed at [facility], [address] [place], [place] [address]    Culture   Final    NO GROWTH 3 DAYS Performed at [facility], [address], [place], [place] [address]    Report Status PENDING  Incomplete     Time coordinating discharge: Over 30 minutes  SIGNED:   Azucena Fallen, DO [facility] 01/03/2021, 4:13 PM Pager   If 7PM-7AM, please contact night-coverage [email or web address]

## 2021-01-03 NOTE — TOC Progression Note (Signed)
Transition of Care (TOC) - Progression Note    Patient Details  Name: Carlos Short. MRN: [id number] Date of Birth: 1950/08/11  Transition of Care [facility]) CM/SW Contact  Geni Bers, RN Phone Number: 01/03/2021, 11:15 AM  Clinical Narrative:     Pt states that he will not need a Walker at present time.   Expected Discharge Plan: Home/Self Care Barriers to Discharge: No Barriers Identified  Expected Discharge Plan and Services Expected Discharge Plan: Home/Self Care       Living arrangements for the past 2 months: Single Family Home Expected Discharge Date: 01/03/21                                     Social Determinants of Health (SDOH) Interventions    Readmission Risk Interventions No flowsheet data found.

## 2021-01-04 ENCOUNTER — Other Ambulatory Visit: Payer: Medicare Other

## 2021-01-05 ENCOUNTER — Other Ambulatory Visit: Payer: Medicare Other

## 2021-01-05 ENCOUNTER — Inpatient Hospital Stay: Payer: Medicare Other

## 2021-01-05 DIAGNOSIS — I491 Atrial premature depolarization: Secondary | ICD-10-CM

## 2021-01-05 DIAGNOSIS — R5383 Other fatigue: Secondary | ICD-10-CM

## 2021-01-05 LAB — CULTURE, BLOOD (ROUTINE X 2)
Culture: NO GROWTH
Culture: NO GROWTH
Special Requests: ADEQUATE
Special Requests: ADEQUATE

## 2021-01-05 NOTE — Progress Notes (Signed)
Please notify patient repeat ultrasound in 1 year to follow-up on abdominal aortic aneurysm.  Also will forward results to PCP for review simple cyst noted in the abdomen.  Please send results to patient's PCP

## 2021-01-06 NOTE — Progress Notes (Signed)
Spoke with patient wife, she will have patient contact PCP office to schedule an appointment.   BEV: Can you fax over lab results to their office, please.

## 2021-01-11 DIAGNOSIS — I499 Cardiac arrhythmia, unspecified: Secondary | ICD-10-CM

## 2021-01-11 HISTORY — DX: Cardiac arrhythmia, unspecified: I49.9

## 2021-01-17 ENCOUNTER — Other Ambulatory Visit: Payer: Medicare Other

## 2021-01-17 ENCOUNTER — Inpatient Hospital Stay: Payer: Medicare Other

## 2021-01-19 ENCOUNTER — Other Ambulatory Visit: Payer: Medicare Other

## 2021-01-19 ENCOUNTER — Inpatient Hospital Stay: Payer: Medicare Other

## 2021-01-29 DIAGNOSIS — D693 Immune thrombocytopenic purpura: Secondary | ICD-10-CM

## 2021-01-29 HISTORY — DX: Immune thrombocytopenic purpura: D69.3

## 2021-02-01 ENCOUNTER — Ambulatory Visit: Payer: Medicare Other

## 2021-02-01 ENCOUNTER — Inpatient Hospital Stay: Payer: Medicare Other

## 2021-02-01 ENCOUNTER — Other Ambulatory Visit: Payer: Self-pay

## 2021-02-01 DIAGNOSIS — I509 Heart failure, unspecified: Secondary | ICD-10-CM | POA: Insufficient documentation

## 2021-02-01 DIAGNOSIS — R6889 Other general symptoms and signs: Secondary | ICD-10-CM

## 2021-02-01 HISTORY — DX: Heart failure, unspecified: I50.9

## 2021-02-02 NOTE — Progress Notes (Signed)
Pt aware.

## 2021-02-02 NOTE — Progress Notes (Signed)
No significant evidence of ischemia.

## 2021-02-02 NOTE — Progress Notes (Signed)
We will discuss further at upcoming office visit.

## 2021-02-08 NOTE — Progress Notes (Signed)
Called and spoke with patient regarding his echocardiogram results.  ?

## 2021-02-08 NOTE — Progress Notes (Signed)
Called pt, no answer. Left vm requesting call back?

## 2021-02-16 NOTE — Progress Notes (Signed)
Primary Physician/Referring:  Barbie Banner, MD  Patient ID: Carlos Fitz., male    DOB: [birth year], 70 y.o.   MRN: [id number]  Chief Complaint  Patient presents with   Hypertension   Results    HPI:    Carlos Short.  is a 70 y.o. Caucasian male with history of bipolar versus schizophrenia, alcohol abuse (quit in 1985), present tobacco use (>50-year pack history).  Patient was urgently referred to our office 12/24/2020 by PCP for bradycardia, however EKG at that time revealed sinus rhythm at a rate of 77 bpm with frequent PACs.  Last office visit started patient on Toprol-XL 12.5 mg p.o. daily and ordered echocardiogram, stress test, and monitor, as well as aortic duplex given prominent abdominal pulsation.  Aortic duplex noted abdominal aortic aneurysm, recommended repeat in 1 year.  Stress test was overall low risk.  Echocardiogram noted moderate global hypokinesis with LVEF approximately 40%, LVH moderate MR and moderate TR.  Is also cardiac monitor pending.   Up until approximately 2 days ago patient had been taking metoprolol without issue.  However he recently stopped metoprolol over the last 2 to 3 days given concern that it was making him feel anxious.  However patient was recently seen by PCP who started him on an antibiotic which patient now feels is the likely culprit for making him feel nervous.  Denies chest pain, palpitations, syncope, near syncope, orthopnea, PND.  Past Medical History:  Diagnosis Date   Abnormal weight gain    Anxiety    Depression    Hyperlipidemia    Hypertrophy of prostate with urinary obstruction and other lower urinary tract symptoms (LUTS)    Insomnia, unspecified    Kidney stone    Mood disorder in conditions classified elsewhere    Other chronic pain    neuropathy pain rt foot post op hip surg   Wears glasses    Wears partial dentures    top and bottom partial   Past Surgical History:  Procedure Laterality Date    INGUINAL HERNIA REPAIR Bilateral 08/16/2012   Procedure: LAPAROSCOPIC BILATERAL INGUINAL HERNIA REPAIR;  Surgeon: Wilmon Arms. Corliss Skains, MD;  Location: [facility];  Service: General;  Laterality: Bilateral;   INSERTION OF MESH Bilateral 08/16/2012   Procedure: INSERTION OF MESH;  Surgeon: Wilmon Arms. Corliss Skains, MD;  Location: [facility];  Service: General;  Laterality: Bilateral;   MULTIPLE TOOTH EXTRACTIONS     TONSILLECTOMY     TOTAL HIP REVISION Right 2005   UPPER GI ENDOSCOPY     Family History  Problem Relation Age of Onset   Depression Mother    Hypertension Father    Heart attack Father 41       2 HEART ATTACKS   Kidney cancer Father    Lupus Father    Depression Brother    Migraines Neg Hx     Social History   Tobacco Use   Smoking status: Every Day    Packs/day: 0.25    Years: 50.00    Pack years: 12.50    Types: Cigarettes   Smokeless tobacco: Never  Substance Use Topics   Alcohol use: Not Currently    Comment: was an alchoholic, 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   Marital Status: Married   ROS  Review of Systems  Constitutional: Negative for malaise/fatigue (resolved) and weight gain.  Cardiovascular:  Negative for chest pain, claudication, leg swelling, near-syncope, orthopnea, palpitations, paroxysmal nocturnal dyspnea and syncope.  Decreased exercise tolerance  Respiratory:  Negative for shortness of breath.   Neurological:  Negative for dizziness.   Objective  Blood pressure 130/80, pulse (!) 57, temperature 97.9 F (36.6 C), temperature source Temporal, height 5\' 10"  (1.778 m), weight 190 lb (86.2 kg), SpO2 94 %.  Vitals with BMI 02/17/2021 01/03/2021 01/02/2021  Height 5\' 10"  - -  Weight 190 lbs - -  BMI 0000000 - -  Systolic [id number]  Diastolic 80 83 82  Pulse 57 48 92      Physical Exam Vitals reviewed.  HENT:     Head: Normocephalic and atraumatic.  Cardiovascular:     Rate and Rhythm: Normal rate and regular rhythm. Occasional  Extrasystoles are present.    Pulses: Intact distal pulses.     Heart sounds: S1 normal and S2 normal. No murmur heard.   No gallop.  Pulmonary:     Effort: Pulmonary effort is normal. No respiratory distress.     Breath sounds: No wheezing, rhonchi or rales.  Abdominal:     Comments: Prominent abdominal pulsation  Musculoskeletal:     Right lower leg: No edema.     Left lower leg: No edema.  Neurological:     Mental Status: He is alert.    Laboratory examination:   Recent Labs    12/31/20 1052 01/03/21 0330  NA 137 138  K 3.7 3.3*  CL 107 108  CO2 20* 26  GLUCOSE 112* 95  BUN 12 15  CREATININE 1.13 0.98  CALCIUM 8.7* 8.1*  GFRNONAA >60 >60   CrCl cannot be calculated (Patient's most recent lab result is older than the maximum 21 days allowed.).  CMP Latest Ref Rng & Units 01/03/2021 12/31/2020 09/24/2018  Glucose 70 - 99 mg/dL 95 112(H) 119(H)  BUN 8 - 23 mg/dL 15 12 18   Creatinine 0.61 - 1.24 mg/dL 0.98 1.13 1.25(H)  Sodium 135 - 145 mmol/L 138 137 138  Potassium 3.5 - 5.1 mmol/L 3.3(L) 3.7 3.8  Chloride 98 - 111 mmol/L 108 107 104  CO2 22 - 32 mmol/L 26 20(L) 22  Calcium 8.9 - 10.3 mg/dL 8.1(L) 8.7(L) 8.7(L)  Total Protein 6.5 - 8.1 g/dL - 7.1 7.0  Total Bilirubin 0.3 - 1.2 mg/dL - 0.7 0.7  Alkaline Phos 38 - 126 U/L - 58 56  AST 15 - 41 U/L - 16 15  ALT 0 - 44 U/L - 13 14   CBC Latest Ref Rng & Units 01/03/2021 01/01/2021 12/31/2020  WBC 4.0 - 10.5 K/uL 10.3 22.7(H) 17.6(H)  Hemoglobin 13.0 - 17.0 g/dL 13.6 13.6 15.6  Hematocrit 39.0 - 52.0 % 40.4 39.8 45.4  Platelets 150 - 400 K/uL 110(L) 100(L) 120(L)    Lipid Panel No results for input(s): CHOL, TRIG, LDLCALC, VLDL, HDL, CHOLHDL, LDLDIRECT in the last 8760 hours.  HEMOGLOBIN A1C No results found for: HGBA1C, MPG TSH No results for input(s): TSH in the last 8760 hours.  External labs:   None  Allergies   Allergies  Allergen Reactions   Gabapentin     Other reaction(s): Other (See  Comments) crazy    Medications Prior to Visit:   Outpatient Medications Prior to Visit  Medication Sig Dispense Refill   doxazosin (CARDURA) 8 MG tablet Take 16 mg by mouth daily.     fluticasone (FLONASE) 50 MCG/ACT nasal spray Place 1 spray into both nostrils daily as needed for allergies.     HYDROmorphone (DILAUDID) 4 MG tablet Take 4 mg by  mouth every 4 (four) hours as needed for severe pain or moderate pain (breakthrough pain).     ibandronate (BONIVA) 150 MG tablet Take 1 tablet by mouth every 30 (thirty) days.     rOPINIRole (REQUIP) 1 MG tablet Take 1 mg by mouth 2 (two) times daily as needed (restless legs).     sertraline (ZOLOFT) 100 MG tablet Take 150 mg by mouth daily.     traMADol (ULTRAM) 50 MG tablet Take 100 mg by mouth 3 (three) times daily.     metoprolol succinate (TOPROL-XL) 25 MG 24 hr tablet Take 0.5 tablets (12.5 mg total) by mouth daily. Take with or immediately following a meal. (Patient not taking: Reported on 02/17/2021) 45 tablet 3   No facility-administered medications prior to visit.   Final Medications at End of Visit    Current Meds  Medication Sig   doxazosin (CARDURA) 8 MG tablet Take 16 mg by mouth daily.   fluticasone (FLONASE) 50 MCG/ACT nasal spray Place 1 spray into both nostrils daily as needed for allergies.   HYDROmorphone (DILAUDID) 4 MG tablet Take 4 mg by mouth every 4 (four) hours as needed for severe pain or moderate pain (breakthrough pain).   ibandronate (BONIVA) 150 MG tablet Take 1 tablet by mouth every 30 (thirty) days.   rOPINIRole (REQUIP) 1 MG tablet Take 1 mg by mouth 2 (two) times daily as needed (restless legs).   sertraline (ZOLOFT) 100 MG tablet Take 150 mg by mouth daily.   traMADol (ULTRAM) 50 MG tablet Take 100 mg by mouth 3 (three) times daily.   Radiology:   No results found.  Cardiac Studies:   PCV ECHOCARDIOGRAM COMPLETE 02/01/2021 Left ventricle cavity is normal in size. Mild concentric hypertrophy of the left  ventricle. Moderate global hypokinesis with frequent ectopy. LVEF around 40%. Indeterminate diastolic filling pattern. Left atrial cavity is mildly dilated. Moderate (Grade II) mitral regurgitation. Moderate tricuspid regurgitation. Mild pulmonic regurgitation. No evidence of pulmonary hypertension.   PCV MYOCARDIAL PERFUSION WO LEXISCAN 02/01/2021 Non-diagnostic ECG stress. Atrial bigeminy, occasional PVC at rest and stress. There is a fixed mild defect in the inferior region consistent with diaphragmatic attenuation. Minimal ischemia in this region cannot be excluded. Overall LV systolic function is normal without regional wall motion abnormalities. Calculated Stress LV EF: 49% but visually appears normal. No previous exam available for comparison.  Abdominal Aortic Duplex 12/30/2020:  Moderate dilatation of the abdominal aorta is noted in the proximal and  mid and distal aorta. Mild plaque noted in the proximal, mid and distal  aorta. An abdominal aortic aneurysm measuring 4.14 x 4.08 x 4.19 cm is  seenin the proximal aorta. .No septation noted.  Mild plaque noted in the right internal iliac and left internal iliac IIA.   There is a large cystic mass (appears to be simple cyst) in the suprapubic  portion of the abdomen. Consider dedicated abdominal ultrasound for  further delineation.  Recheck [place] in 1 year for f/u of AAA. See enclosed image.    EKG:   12/24/2020: Sinus rhythm with frequent PACs at a rate of 77 bpm.  Left axis.  Right bundle branch block with secondary ST-T wave changes, cannot exclude anterior ischemia.   Assessment     ICD-10-CM   1. PAC (premature atrial contraction)  I49.1     2. Primary hypertension  I10     3. Decreased exercise tolerance  R68.89     4. Abdominal aortic aneurysm (AAA) without rupture, unspecified part  I71.40 PCV AORTA DUPLEX       Medications Discontinued During This Encounter  Medication Reason   metoprolol succinate (TOPROL-XL)  25 MG 24 hr tablet Reorder    Meds ordered this encounter  Medications   metoprolol succinate (TOPROL-XL) 25 MG 24 hr tablet    Sig: Take 1 tablet (25 mg total) by mouth daily. Take with or immediately following a meal.    Dispense:  45 tablet    Refill:  3    Recommendations:   Carlos Adger. is a 70 y.o. Caucasian male with history of bipolar versus schizophrenia, alcohol abuse (quit in 1985), present tobacco use (>50-year pack history).  Patient was urgently referred to our office 12/24/2020 by PCP for bradycardia, however EKG at that time revealed sinus rhythm at a rate of 77 bpm with frequent PACs.  Last office visit started patient on Toprol-XL 12.5 mg p.o. daily and ordered echocardiogram, stress test, and monitor, as well as aortic duplex given prominent abdominal pulsation.  Aortic duplex noted abdominal aortic aneurysm, recommended repeat in 1 year.  Stress test was overall low risk.  Echocardiogram noted moderate global hypokinesis with LVEF approximately 40%, LVH moderate MR and moderate TR.  Is also cardiac monitor pending.  Will low risk stress test suspect reduced LVEF is secondary to frequent PACs.  Will increase metoprolol from 12.5 to 25 mg daily.  Reviewed and discussed with patient results of cardiac testing, detailed above.  Patient's questions were addressed.  Given abdominal aortic aneurysm Will repeat surveillance scan in 1 year.  Further recommendations pending results of cardiac monitor.  However suspect LVEF will improve over time with suppression of PACs.  Could consider addition of losartan in the future given AAA, however will hold off at this time and monitor blood pressure with increased dose of metoprolol first.  Follow-up in 3 months, sooner if needed, for PACs and left ventricular dysfunction.   Alethia Berthold, PA-C 02/17/2021, 2:17 PM Office: [phone]

## 2021-02-17 ENCOUNTER — Ambulatory Visit: Payer: Medicare Other | Admitting: Student

## 2021-02-17 ENCOUNTER — Other Ambulatory Visit: Payer: Self-pay

## 2021-02-17 ENCOUNTER — Encounter: Payer: Self-pay | Admitting: Student

## 2021-02-17 VITALS — BP 130/80 | HR 57 | Temp 97.9°F | Ht 70.0 in | Wt 190.0 lb

## 2021-02-17 DIAGNOSIS — I714 Abdominal aortic aneurysm, without rupture, unspecified: Secondary | ICD-10-CM

## 2021-02-17 DIAGNOSIS — I491 Atrial premature depolarization: Secondary | ICD-10-CM

## 2021-02-17 DIAGNOSIS — R6889 Other general symptoms and signs: Secondary | ICD-10-CM

## 2021-02-17 DIAGNOSIS — I10 Essential (primary) hypertension: Secondary | ICD-10-CM

## 2021-02-17 MED ORDER — METOPROLOL SUCCINATE ER 25 MG PO TB24: 25.0000 mg | ORAL_TABLET | Freq: Every day | ORAL | 3 refills | Status: DC

## 2021-03-15 ENCOUNTER — Encounter: Payer: Self-pay | Admitting: Student

## 2021-03-15 ENCOUNTER — Ambulatory Visit: Payer: Medicare Other | Admitting: Student

## 2021-03-15 ENCOUNTER — Other Ambulatory Visit: Payer: Self-pay

## 2021-03-15 VITALS — BP 113/69 | HR 50 | Temp 98.1°F | Ht 70.0 in | Wt 195.0 lb

## 2021-03-15 DIAGNOSIS — I480 Paroxysmal atrial fibrillation: Secondary | ICD-10-CM

## 2021-03-15 DIAGNOSIS — I519 Heart disease, unspecified: Secondary | ICD-10-CM

## 2021-03-15 DIAGNOSIS — I10 Essential (primary) hypertension: Secondary | ICD-10-CM

## 2021-03-15 DIAGNOSIS — I491 Atrial premature depolarization: Secondary | ICD-10-CM

## 2021-03-15 NOTE — Progress Notes (Signed)
Primary Physician/Referring:  Christain Sacramento, MD  Patient ID: Carlos Short., male    DOB: 07-20-1950, 71 y.o.   MRN: [id number]  Chief Complaint  Patient presents with   Atrial Fibrillation   Follow-up   Results    HPI:    Byran Short.  is a 71 y.o. Caucasian male with history of bipolar versus schizophrenia, alcohol abuse (quit in 1985), present tobacco use (>50-year pack history).  Patient was urgently referred to our office 12/24/2020 by PCP for bradycardia, however EKG at that time revealed sinus rhythm at a rate of 77 bpm with frequent PACs.  Patient was therefore started on Toprol-XL 25 mg daily.  He underwent echocardiogram which revealed moderate global hypokinesis with LVEF approximately 40% and moderate valvular disease.  Aortic duplex also noted abdominal aortic aneurysm, therefore recommended annual surveillance.  Patient also underwent stress test which was overall low risk.  Patient presents for follow-up to discuss results of cardiac monitor.  Which revealed frequent PACs with burden of 21.6% as well as frequent episodes of atrial fibrillation/flutter with the longest lasting 45 minutes, A. fib/flutter burden 14%.  Patient is relatively asymptomatic and feeling well overall.  Denies  chest pain, palpitations, syncope, near syncope, orthopnea, PND.   Past Medical History:  Diagnosis Date   Abnormal weight gain    Anxiety    Depression    Hyperlipidemia    Hypertrophy of prostate with urinary obstruction and other lower urinary tract symptoms (LUTS)    Insomnia, unspecified    Kidney stone    Mood disorder in conditions classified elsewhere    Other chronic pain    neuropathy pain rt foot post op hip surg   Wears glasses    Wears partial dentures    top and bottom partial   Past Surgical History:  Procedure Laterality Date   INGUINAL HERNIA REPAIR Bilateral 08/16/2012   Procedure: LAPAROSCOPIC BILATERAL INGUINAL HERNIA REPAIR;  Surgeon: Imogene Burn. Georgette Dover, MD;  Location: [place];  Service: General;  Laterality: Bilateral;   INSERTION OF MESH Bilateral 08/16/2012   Procedure: INSERTION OF MESH;  Surgeon: Imogene Burn. Georgette Dover, MD;  Location: [place];  Service: General;  Laterality: Bilateral;   MULTIPLE TOOTH EXTRACTIONS     TONSILLECTOMY     TOTAL HIP REVISION Right 2005   UPPER GI ENDOSCOPY     Family History  Problem Relation Age of Onset   Depression Mother    Hypertension Father    Heart attack Father 2       2 HEART ATTACKS   Kidney cancer Father    Lupus Father    Depression Brother    Migraines Neg Hx     Social History   Tobacco Use   Smoking status: Every Day    Packs/day: 0.25    Years: 50.00    Pack years: 12.50    Types: Cigarettes   Smokeless tobacco: Never  Substance Use Topics   Alcohol use: Not Currently    Comment: was an alchoholic, 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   Marital Status: Married   ROS  Review of Systems  Constitutional: Negative for malaise/fatigue and weight gain.  Cardiovascular:  Negative for chest pain, claudication, leg swelling, near-syncope, orthopnea, palpitations, paroxysmal nocturnal dyspnea and syncope.       Decreased exercise tolerance  Respiratory:  Negative for shortness of breath.   Neurological:  Negative for dizziness.   Objective  Blood pressure 113/69, pulse Marland Kitchen)  50, temperature 98.1 F (36.7 C), temperature source Temporal, height 5\' 10"  (1.778 m), weight 195 lb (88.5 kg), SpO2 98 %.  Vitals with BMI 03/15/2021 02/17/2021 01/03/2021  Height 5\' 10"  5\' 10"  -  Weight 195 lbs 190 lbs -  BMI [id number] 0000000 -  Systolic 123456 [id number]  Diastolic 69 80 83  Pulse 50 57 48      Physical Exam Vitals reviewed.  HENT:     Head: Normocephalic and atraumatic.  Cardiovascular:     Rate and Rhythm: Normal rate and regular rhythm. Occasional Extrasystoles are present.    Pulses: Intact distal pulses.     Heart sounds: S1 normal and S2 normal. No murmur heard.    No gallop.  Pulmonary:     Effort: Pulmonary effort is normal. No respiratory distress.     Breath sounds: No wheezing, rhonchi or rales.  Abdominal:     Comments: Prominent abdominal pulsation  Musculoskeletal:     Right lower leg: No edema.     Left lower leg: No edema.  Neurological:     Mental Status: He is alert.  Physical exam unchanged compared to previous office visit.  Laboratory examination:   Recent Labs    12/31/20 1052 01/03/21 0330  NA 137 138  K 3.7 3.3*  CL 107 108  CO2 20* 26  GLUCOSE 112* 95  BUN 12 15  CREATININE 1.13 0.98  CALCIUM 8.7* 8.1*  GFRNONAA >60 >60   CrCl cannot be calculated (Patient's most recent lab result is older than the maximum 21 days allowed.).  CMP Latest Ref Rng & Units 01/03/2021 12/31/2020 09/24/2018  Glucose 70 - 99 mg/dL 95 112(H) 119(H)  BUN 8 - 23 mg/dL 15 12 18   Creatinine 0.61 - 1.24 mg/dL 0.98 1.13 1.25(H)  Sodium 135 - 145 mmol/L 138 137 138  Potassium 3.5 - 5.1 mmol/L 3.3(L) 3.7 3.8  Chloride 98 - 111 mmol/L 108 107 104  CO2 22 - 32 mmol/L 26 20(L) 22  Calcium 8.9 - 10.3 mg/dL 8.1(L) 8.7(L) 8.7(L)  Total Protein 6.5 - 8.1 g/dL - 7.1 7.0  Total Bilirubin 0.3 - 1.2 mg/dL - 0.7 0.7  Alkaline Phos 38 - 126 U/L - 58 56  AST 15 - 41 U/L - 16 15  ALT 0 - 44 U/L - 13 14   CBC Latest Ref Rng & Units 01/03/2021 01/01/2021 12/31/2020  WBC 4.0 - 10.5 K/uL 10.3 22.7(H) 17.6(H)  Hemoglobin 13.0 - 17.0 g/dL 13.6 13.6 15.6  Hematocrit 39.0 - 52.0 % 40.4 39.8 45.4  Platelets 150 - 400 K/uL 110(L) 100(L) 120(L)    Lipid Panel No results for input(s): CHOL, TRIG, LDLCALC, VLDL, HDL, CHOLHDL, LDLDIRECT in the last 8760 hours.  HEMOGLOBIN A1C No results found for: HGBA1C, MPG TSH No results for input(s): TSH in the last 8760 hours.  External labs:   None  Allergies   Allergies  Allergen Reactions   Gabapentin     Other reaction(s): Other (See Comments) crazy    Medications Prior to Visit:   Outpatient Medications  Prior to Visit  Medication Sig Dispense Refill   doxazosin (CARDURA) 8 MG tablet Take 16 mg by mouth daily.     fluticasone (FLONASE) 50 MCG/ACT nasal spray Place 1 spray into both nostrils daily as needed for allergies.     HYDROmorphone (DILAUDID) 4 MG tablet Take 4 mg by mouth every 4 (four) hours as needed for severe pain or moderate pain (breakthrough pain).  ibandronate (BONIVA) 150 MG tablet Take 1 tablet by mouth every 30 (thirty) days.     metoprolol succinate (TOPROL-XL) 25 MG 24 hr tablet Take 1 tablet (25 mg total) by mouth daily. Take with or immediately following a meal. 45 tablet 3   rOPINIRole (REQUIP) 1 MG tablet Take 1 mg by mouth 2 (two) times daily as needed (restless legs).     sertraline (ZOLOFT) 100 MG tablet Take 150 mg by mouth daily.     traMADol (ULTRAM) 50 MG tablet Take 100 mg by mouth 3 (three) times daily.     No facility-administered medications prior to visit.   Final Medications at End of Visit    Current Meds  Medication Sig   apixaban (ELIQUIS) 5 MG TABS tablet Take 1 tablet (5 mg total) by mouth 2 (two) times daily.   doxazosin (CARDURA) 8 MG tablet Take 16 mg by mouth daily.   dronedarone (MULTAQ) 400 MG tablet Take 1 tablet (400 mg total) by mouth 2 (two) times daily with a meal.   fluticasone (FLONASE) 50 MCG/ACT nasal spray Place 1 spray into both nostrils daily as needed for allergies.   HYDROmorphone (DILAUDID) 4 MG tablet Take 4 mg by mouth every 4 (four) hours as needed for severe pain or moderate pain (breakthrough pain).   ibandronate (BONIVA) 150 MG tablet Take 1 tablet by mouth every 30 (thirty) days.   metoprolol succinate (TOPROL-XL) 25 MG 24 hr tablet Take 1 tablet (25 mg total) by mouth daily. Take with or immediately following a meal.   rOPINIRole (REQUIP) 1 MG tablet Take 1 mg by mouth 2 (two) times daily as needed (restless legs).   sertraline (ZOLOFT) 100 MG tablet Take 150 mg by mouth daily.   traMADol (ULTRAM) 50 MG tablet Take  100 mg by mouth 3 (three) times daily.   Radiology:   No results found.  Cardiac Studies:   PCV ECHOCARDIOGRAM COMPLETE 02/01/2021 Left ventricle cavity is normal in size. Mild concentric hypertrophy of the left ventricle. Moderate global hypokinesis with frequent ectopy. LVEF around 40%. Indeterminate diastolic filling pattern. Left atrial cavity is mildly dilated. Moderate (Grade II) mitral regurgitation. Moderate tricuspid regurgitation. Mild pulmonic regurgitation. No evidence of pulmonary hypertension.   PCV MYOCARDIAL PERFUSION WO LEXISCAN 02/01/2021 Non-diagnostic ECG stress. Atrial bigeminy, occasional PVC at rest and stress. There is a fixed mild defect in the inferior region consistent with diaphragmatic attenuation. Minimal ischemia in this region cannot be excluded. Overall LV systolic function is normal without regional wall motion abnormalities. Calculated Stress LV EF: 49% but visually appears normal. No previous exam available for comparison.  Abdominal Aortic Duplex 12/30/2020:  Moderate dilatation of the abdominal aorta is noted in the proximal and  mid and distal aorta. Mild plaque noted in the proximal, mid and distal  aorta. An abdominal aortic aneurysm measuring 4.14 x 4.08 x 4.19 cm is  seenin the proximal aorta. .No septation noted.  Mild plaque noted in the right internal iliac and left internal iliac IIA.   There is a large cystic mass (appears to be simple cyst) in the suprapubic portion of the abdomen. Consider dedicated abdominal ultrasound for further delineation.  Recheck [place] in 1 year for f/u of AAA. See enclosed image.   Ambulatory cardiac telemetry 14 days (02/01/2021 - 02/15/2021): Predominant underlying rhythm was sinus.  With 14% atrial fibrillation/flutter burden, longest lasting 45 minutes.  Frequent PACs with 21.6% burden.  Occasional PVCs.  There is a single episode of nonsustained ventricular  tachycardia lasting 4 beats and asymptomatic.  There  were no patient triggered events.   EKG:   12/24/2020: Sinus rhythm with frequent PACs at a rate of 77 bpm.  Left axis.  Right bundle branch block with secondary ST-T wave changes, cannot exclude anterior ischemia.   Assessment     ICD-10-CM   1. Paroxysmal atrial fibrillation (HCC)  I48.0     2. PAC (premature atrial contraction)  I49.1     3. Primary hypertension  I10     4. LV dysfunction  I51.9         There are no discontinued medications.   Meds ordered this encounter  Medications   dronedarone (MULTAQ) 400 MG tablet    Sig: Take 1 tablet (400 mg total) by mouth 2 (two) times daily with a meal.    Dispense:  60 tablet    Refill:  3   apixaban (ELIQUIS) 5 MG TABS tablet    Sig: Take 1 tablet (5 mg total) by mouth 2 (two) times daily.    Dispense:  60 tablet    Refill:  3   This patients CHA2DS2-VASc Score 3 (Vasc, Age, HF) and yearly risk of stroke 3.2%.   Recommendations:   Chapel Zellar. is a 71 y.o. Caucasian male with history of bipolar versus schizophrenia, alcohol abuse (quit in 1985), present tobacco use (>50-year pack history).  Patient was urgently referred to our office 12/24/2020 by PCP for bradycardia, however EKG at that time revealed sinus rhythm at a rate of 77 bpm with frequent PACs.  Patient was therefore started on Toprol-XL 25 mg daily.  He underwent echocardiogram which revealed moderate global hypokinesis with LVEF approximately 40% and moderate valvular disease.  Aortic duplex also noted abdominal aortic aneurysm, therefore recommended annual surveillance.  Patient also underwent stress test which was overall low risk.  Patient presents for follow-up to discuss results of cardiac monitor.  Which revealed frequent PACs with burden of 21.6% as well as frequent episodes of atrial fibrillation/flutter with the longest lasting 45 minutes, A. fib/flutter burden 14%.   Given atrial fibrillation found on recent cardiac monitor discussed at length  with both patient and wife indications, risk, benefits of anticoagulation given CHA2DS2-VASc score of 3.  Patient's questions were addressed to his satisfaction and he acknowledges risks of anticoagulation and wishes to proceed.  We will start Eliquis 5 mg p.o. twice daily.   Given relatively high atrial fibrillation/flutter burden as well as reduced LVEF, which may be related to underlying arrhythmia, as well as frequent PACs shared decision was to initiate antiarrhythmic therapy as well.  We will start Multaq 400 mg p.o. twice daily. Will repeat EKG.   Could consider addition of losartan in the future given AAA, however will hold off at this time as blood pressure is soft.  Follow up for EKG in 1 week. Office visit in 3 months, sooner if needed.    Alethia Berthold, PA-C 03/18/2021, 3:00 PM Office: [phone]

## 2021-03-18 MED ORDER — APIXABAN 5 MG PO TABS: 5.0000 mg | ORAL_TABLET | Freq: Two times a day (BID) | ORAL | 3 refills | Status: DC

## 2021-03-18 MED ORDER — MULTAQ 400 MG PO TABS: 400.0000 mg | ORAL_TABLET | Freq: Two times a day (BID) | ORAL | 3 refills | Status: DC

## 2021-03-22 ENCOUNTER — Ambulatory Visit: Payer: Medicare Other

## 2021-03-29 ENCOUNTER — Telehealth: Payer: Self-pay

## 2021-03-29 MED ORDER — DRONEDARONE HCL 400 MG PO TABS: 400.0000 mg | ORAL_TABLET | Freq: Two times a day (BID) | ORAL | 3 refills | Status: DC

## 2021-03-29 NOTE — Telephone Encounter (Signed)
Pt called back and stated that Multaq was never sent in. Can you add this medication to his chart and send it in? I advised pt to give [place] a call when the pt starts it so we can get his EKG scheduled.

## 2021-03-29 NOTE — Telephone Encounter (Signed)
Multaq had been recommended at last visit however upon further questioning patient was finishing a course of ciprofloxacin, therefore held off on initiation of Multaq. Patient reports he finished antibiotic course 2 weeks ago. Will therefore start Multaq as previously discussed with Dr. Jacinto Halim at last visit.   Will plan to repeat EKG 1 week after starting Multaq.

## 2021-03-29 NOTE — Telephone Encounter (Signed)
Multaq had been recommended at last visit however upon further questioning patient was finishing a course of ciprofloxacin, therefore held off on initiation of Multaq. Patient finished antibiotic course

## 2021-03-30 NOTE — Telephone Encounter (Signed)
Called and spoke to pts wife. She stated that she will have the pt start the medication either today or tomorrow. I have had the front re-schedule nurse visit EKG to 04/07/2021.

## 2021-03-31 ENCOUNTER — Emergency Department (HOSPITAL_COMMUNITY): Payer: Medicare Other

## 2021-03-31 ENCOUNTER — Other Ambulatory Visit: Payer: Self-pay

## 2021-03-31 ENCOUNTER — Inpatient Hospital Stay (HOSPITAL_COMMUNITY)
Admission: EM | Admit: 2021-03-31 | Discharge: 2021-04-03 | DRG: 871 | Disposition: A | Discharge status: Home Health | Payer: Medicare Other | Attending: Family Medicine | Admitting: Family Medicine

## 2021-03-31 ENCOUNTER — Encounter (HOSPITAL_COMMUNITY): Payer: Self-pay

## 2021-03-31 DIAGNOSIS — Z87440 Personal history of urinary (tract) infections: Secondary | ICD-10-CM

## 2021-03-31 DIAGNOSIS — Z818 Family history of other mental and behavioral disorders: Secondary | ICD-10-CM

## 2021-03-31 DIAGNOSIS — M5440 Lumbago with sciatica, unspecified side: Secondary | ICD-10-CM | POA: Diagnosis present

## 2021-03-31 DIAGNOSIS — D696 Thrombocytopenia, unspecified: Secondary | ICD-10-CM | POA: Diagnosis present

## 2021-03-31 DIAGNOSIS — M545 Low back pain, unspecified: Secondary | ICD-10-CM | POA: Diagnosis present

## 2021-03-31 DIAGNOSIS — N390 Urinary tract infection, site not specified: Secondary | ICD-10-CM | POA: Diagnosis not present

## 2021-03-31 DIAGNOSIS — G8929 Other chronic pain: Secondary | ICD-10-CM | POA: Diagnosis present

## 2021-03-31 DIAGNOSIS — N210 Calculus in bladder: Secondary | ICD-10-CM | POA: Diagnosis present

## 2021-03-31 DIAGNOSIS — Z7901 Long term (current) use of anticoagulants: Secondary | ICD-10-CM

## 2021-03-31 DIAGNOSIS — I480 Paroxysmal atrial fibrillation: Secondary | ICD-10-CM | POA: Diagnosis present

## 2021-03-31 DIAGNOSIS — F419 Anxiety disorder, unspecified: Secondary | ICD-10-CM | POA: Diagnosis present

## 2021-03-31 DIAGNOSIS — Z888 Allergy status to other drugs, medicaments and biological substances status: Secondary | ICD-10-CM

## 2021-03-31 DIAGNOSIS — G9341 Metabolic encephalopathy: Secondary | ICD-10-CM | POA: Diagnosis present

## 2021-03-31 DIAGNOSIS — A419 Sepsis, unspecified organism: Secondary | ICD-10-CM | POA: Diagnosis not present

## 2021-03-31 DIAGNOSIS — F32A Depression, unspecified: Secondary | ICD-10-CM | POA: Diagnosis present

## 2021-03-31 DIAGNOSIS — I5032 Chronic diastolic (congestive) heart failure: Secondary | ICD-10-CM | POA: Diagnosis present

## 2021-03-31 DIAGNOSIS — E785 Hyperlipidemia, unspecified: Secondary | ICD-10-CM | POA: Diagnosis present

## 2021-03-31 DIAGNOSIS — B9689 Other specified bacterial agents as the cause of diseases classified elsewhere: Secondary | ICD-10-CM | POA: Diagnosis present

## 2021-03-31 DIAGNOSIS — Z20822 Contact with and (suspected) exposure to covid-19: Secondary | ICD-10-CM | POA: Diagnosis present

## 2021-03-31 DIAGNOSIS — I482 Chronic atrial fibrillation, unspecified: Secondary | ICD-10-CM | POA: Diagnosis present

## 2021-03-31 DIAGNOSIS — Z87442 Personal history of urinary calculi: Secondary | ICD-10-CM

## 2021-03-31 DIAGNOSIS — G2581 Restless legs syndrome: Secondary | ICD-10-CM | POA: Diagnosis present

## 2021-03-31 DIAGNOSIS — F17210 Nicotine dependence, cigarettes, uncomplicated: Secondary | ICD-10-CM | POA: Diagnosis present

## 2021-03-31 DIAGNOSIS — I714 Abdominal aortic aneurysm, without rupture, unspecified: Secondary | ICD-10-CM | POA: Diagnosis present

## 2021-03-31 DIAGNOSIS — I110 Hypertensive heart disease with heart failure: Secondary | ICD-10-CM | POA: Diagnosis present

## 2021-03-31 DIAGNOSIS — Z79899 Other long term (current) drug therapy: Secondary | ICD-10-CM

## 2021-03-31 DIAGNOSIS — Z972 Presence of dental prosthetic device (complete) (partial): Secondary | ICD-10-CM

## 2021-03-31 MED ORDER — DRONEDARONE HCL 400 MG PO TABS: 400.0000 mg | ORAL_TABLET | Freq: Two times a day (BID) | ORAL | 3 refills | Status: DC

## 2021-03-31 NOTE — ED Notes (Signed)
Pt unable to provide urine specimen.  

## 2021-03-31 NOTE — ED Triage Notes (Signed)
Pt reports fever beginning today around 5PM with mild confusion. Took what they suspect to be ibuprofen pta for fever of 102. Pt significant other says they are arranging surgery this week for a large bladder stone and he has recently began self catherization at home.

## 2021-03-31 NOTE — ED Provider Notes (Addendum)
[facility] Provider Note   CSN: [id number] Arrival date & time: 03/31/21  2224     History  Chief Complaint  Patient presents with   Fever    Carlos FitzFranklin D Jamroz Jr. is a 71 y.o. male.  The history is provided by the spouse. The history is limited by the condition of the patient.  Fever Max temp prior to arrival:  102 Temp source:  Oral Severity:  Moderate Onset quality:  Gradual Duration: hours. Timing:  Constant Progression:  Resolved Chronicity:  Recurrent Relieved by:  Nothing Worsened by:  Nothing Ineffective treatments:  Ibuprofen Associated symptoms: confusion   Associated symptoms: no diarrhea, no rash and no vomiting   Risk factors: recent sickness       Home Medications Prior to Admission medications   Medication Sig Start Date End Date Taking? Authorizing Provider  apixaban (ELIQUIS) 5 MG TABS tablet Take 1 tablet (5 mg total) by mouth 2 (two) times daily. 03/18/21   Cantwell, Celeste C, PA-C  doxazosin (CARDURA) 8 MG tablet Take 16 mg by mouth daily. 05/03/17   [provider]  dronedarone (MULTAQ) 400 MG tablet Take 1 tablet (400 mg total) by mouth 2 (two) times daily with a meal. 03/31/21   Cantwell, Celeste C, PA-C  fluticasone (FLONASE) 50 MCG/ACT nasal spray Place 1 spray into both nostrils daily as needed for allergies. 12/15/20   [provider]  HYDROmorphone (DILAUDID) 4 MG tablet Take 4 mg by mouth every 4 (four) hours as needed for severe pain or moderate pain (breakthrough pain).    [provider]  ibandronate (BONIVA) 150 MG tablet Take 1 tablet by mouth every 30 (thirty) days. 11/05/20   [provider]  metoprolol succinate (TOPROL-XL) 25 MG 24 hr tablet Take 1 tablet (25 mg total) by mouth daily. Take with or immediately following a meal. 02/17/21 08/16/21  Cantwell, Celeste C, PA-C  rOPINIRole (REQUIP) 1 MG tablet Take 1 mg by mouth 2 (two) times daily as needed (restless legs).  12/06/20   [provider]  sertraline (ZOLOFT) 100 MG tablet Take 150 mg by mouth daily. 05/03/17   [provider]  traMADol (ULTRAM) 50 MG tablet Take 100 mg by mouth 3 (three) times daily.    [provider]      Allergies    Gabapentin    Review of Systems   Review of Systems  Unable to perform ROS: Mental status change  Constitutional:  Positive for fever.  HENT:  Negative for facial swelling.   Eyes:  Negative for redness.  Respiratory:  Negative for stridor.   Cardiovascular:  Negative for leg swelling.  Gastrointestinal:  Negative for diarrhea and vomiting.  Genitourinary:  Negative for difficulty urinating.  Skin:  Negative for rash.  Neurological:  Negative for facial asymmetry.  Psychiatric/Behavioral:  Positive for confusion.    Physical Exam Updated Vital Signs BP 108/80 (BP Location: Left Arm)    Pulse (!) 112    Temp 99.8 F (37.7 C) (Oral)    Resp 18    Ht 5\' 11"  (1.803 m)    Wt 81.6 kg    SpO2 95%    BMI 25.10 kg/m  Physical Exam Vitals and nursing note reviewed. Exam conducted with a chaperone present.  Constitutional:      Appearance: Normal appearance. He is not diaphoretic.  HENT:     Head: Normocephalic and atraumatic.     Nose: Nose normal.  Eyes:  Conjunctiva/sclera: Conjunctivae normal.     Pupils: Pupils are equal, round, and reactive to light.  Cardiovascular:     Rate and Rhythm: Normal rate and regular rhythm.     Pulses: Normal pulses.     Heart sounds: Normal heart sounds.  Pulmonary:     Effort: Pulmonary effort is normal.     Breath sounds: Normal breath sounds. No stridor. No wheezing.  Abdominal:     General: Abdomen is flat. Bowel sounds are normal.     Palpations: Abdomen is soft.     Tenderness: There is no abdominal tenderness. There is no guarding.  Musculoskeletal:        General: Normal range of motion.     Cervical back: Normal range of motion and neck supple. No rigidity.  Skin:    General:  Skin is warm and dry.     Capillary Refill: Capillary refill takes less than 2 seconds.  Neurological:     Mental Status: He is alert.     Deep Tendon Reflexes: Reflexes normal.  Psychiatric:        Mood and Affect: Mood normal.        Behavior: Behavior normal.    ED Results / Procedures / Treatments   Labs (all labs ordered are listed, but only abnormal results are displayed) Results for orders placed or performed during the hospital encounter of 03/31/21  Urinalysis, Routine w reflex microscopic Urine, Clean Catch  Result Value Ref Range   Color, Urine YELLOW YELLOW   APPearance CLOUDY (A) CLEAR   Specific Gravity, Urine 1.011 1.005 - 1.030   pH 7.0 5.0 - 8.0   Glucose, UA NEGATIVE NEGATIVE mg/dL   Hgb urine dipstick LARGE (A) NEGATIVE   Bilirubin Urine NEGATIVE NEGATIVE   Ketones, ur NEGATIVE NEGATIVE mg/dL   Protein, ur 465 (A) NEGATIVE mg/dL   Nitrite POSITIVE (A) NEGATIVE   Leukocytes,Ua LARGE (A) NEGATIVE   RBC / HPF >50 (H) 0 - 5 RBC/hpf   WBC, UA 21-50 0 - 5 WBC/hpf   Bacteria, UA MANY (A) NONE SEEN   Squamous Epithelial / LPF 0-5 0 - 5   WBC Clumps PRESENT    Mucus PRESENT   Lactic acid, plasma  Result Value Ref Range   Lactic Acid, Venous 1.4 0.5 - 1.9 mmol/L  Protime-INR  Result Value Ref Range   Prothrombin Time 13.5 11.4 - 15.2 seconds   INR 1.0 0.8 - 1.2  APTT  Result Value Ref Range   aPTT 29 24 - 36 seconds   DG Chest 2 View  Result Date: 03/31/2021 CLINICAL DATA:  Fever. EXAM: CHEST - 2 VIEW COMPARISON:  12/31/2020. FINDINGS: The heart size and mediastinal contours are within normal limits. Both lungs are clear. Degenerative changes are present in the thoracic spine. IMPRESSION: No active cardiopulmonary disease. Electronically Signed   By: Thornell Sartorius M.D.   On: 03/31/2021 23:34     Radiology DG Chest 2 View  Result Date: 03/31/2021 CLINICAL DATA:  Fever. EXAM: CHEST - 2 VIEW COMPARISON:  12/31/2020. FINDINGS: The heart size and mediastinal  contours are within normal limits. Both lungs are clear. Degenerative changes are present in the thoracic spine. IMPRESSION: No active cardiopulmonary disease. Electronically Signed   By: Thornell Sartorius M.D.   On: 03/31/2021 23:34    Procedures Procedures    Medications  lactated ringers infusion (has no administration in time range)  ceFEPIme (MAXIPIME) 2 g in sodium chloride 0.9 % 100 mL IVPB (has  no administration in time range)  lactated ringers bolus 1,000 mL (has no administration in time range)    And  lactated ringers bolus 1,000 mL (has no administration in time range)    And  lactated ringers bolus 500 mL (has no administration in time range)  sodium chloride 0.9 % bolus 500 mL (0 mLs Intravenous Stopped 04/01/21 0207)  cefTRIAXone (ROCEPHIN) 1 g in sodium chloride 0.9 % 100 mL IVPB (0 g Intravenous Stopped 04/01/21 0207)     ED Course/ Medical Decision Making/ A&P                           Medical Decision Making Amount and/or Complexity of Data Reviewed Independent Historian: spouse    Details: Patient with AMS and fever at home.  Spouse reports they have been seen for this in the past with a h/o sepsis External Data Reviewed: labs. Labs: ordered. ECG/medicine tests: ordered.  Risk Prescription drug management. Decision regarding hospitalization.  Critical Care Total time providing critical care: 30-74 minutes (sepsis bundle, antibiotics and boluses) MDM Reviewed: previous chart, nursing note and vitals Reviewed previous: labs Interpretation: labs, ECG and x-ray (elevated white count, UTI on urine, lactate is normal  NACPD) Total time providing critical care: 30-74 minutes (sepsis bundle, antibiotics and boluses). This excludes time spent performing separately reportable procedures and services. Consults: admitting MD CRITICAL CARE Performed by: Dakota Stangl K Rigley Niess-Rasch Total critical care time: 60 minutes Critical care time was exclusive of separately billable  procedures and treating other patients. Critical care was necessary to treat or prevent imminent or life-threatening deterioration. Critical care was time spent personally by me on the following activities: development of treatment plan with patient and/or surrogate as well as nursing, discussions with consultants, evaluation of patient's response to treatment, examination of patient, obtaining history from patient or surrogate, ordering and performing treatments and interventions, ordering and review of laboratory studies, ordering and review of radiographic studies, pulse oximetry and re-evaluation of patient's condition.   The patient appears reasonably stabilized for admission considering the current resources, flow, and capabilities available in the ED at this time, and I doubt any other [facility] requiring further screening and/or treatment in the ED prior to admission.  Final Clinical Impression(s) / ED Diagnoses Final diagnoses:  None    Admit to medicine     Antoinne Spadaccini, MD 04/01/21 3300    Nicanor Alcon, Nur Krasinski, MD 04/01/21 7622

## 2021-03-31 NOTE — ED Provider Triage Note (Signed)
Emergency Medicine Provider Triage Evaluation Note  Carlos Short. , a 71 y.o. male  was evaluated in triage.  Pt complains of fever.  Patient states that he had a fever that started today around 5 PM that was up to 102.  According to patient's significant other, he also had some altered mental status and confusion.  He has recently begun self-catheterization at home because he has a large bladder stone the supposed to have surgery for this week.  He denies cough and cold symptoms.  He has had 1 episode of nausea and vomiting earlier today.  He has no abdominal pain.  Review of Systems  Positive:  Negative:   Physical Exam  BP 108/80 (BP Location: Left Arm)    Pulse (!) 112    Temp 99.8 F (37.7 C) (Oral)    Resp 18    Ht 5\' 11"  (1.803 m)    Wt 81.6 kg    SpO2 95%    BMI 25.10 kg/m  Gen:   Awake, no distress   Resp:  Normal effort  MSK:   Moves extremities without difficulty  Other:  Abdomen soft and nontender  Medical Decision Making  Medically screening exam initiated at 11:14 PM.  Appropriate orders placed.  . was informed that the remainder of the evaluation will be completed by another provider, this initial triage assessment does not replace that evaluation, and the importance of remaining in the ED until their evaluation is complete.     [name], PA-C 03/31/21 2316

## 2021-04-01 ENCOUNTER — Ambulatory Visit: Payer: Medicare Other

## 2021-04-01 ENCOUNTER — Inpatient Hospital Stay (HOSPITAL_COMMUNITY): Payer: Medicare Other

## 2021-04-01 ENCOUNTER — Encounter (HOSPITAL_COMMUNITY): Payer: Self-pay | Admitting: Emergency Medicine

## 2021-04-01 DIAGNOSIS — M5440 Lumbago with sciatica, unspecified side: Secondary | ICD-10-CM | POA: Diagnosis present

## 2021-04-01 DIAGNOSIS — G8929 Other chronic pain: Secondary | ICD-10-CM | POA: Diagnosis present

## 2021-04-01 DIAGNOSIS — Z20822 Contact with and (suspected) exposure to covid-19: Secondary | ICD-10-CM | POA: Diagnosis present

## 2021-04-01 DIAGNOSIS — I480 Paroxysmal atrial fibrillation: Secondary | ICD-10-CM | POA: Diagnosis present

## 2021-04-01 DIAGNOSIS — I5032 Chronic diastolic (congestive) heart failure: Secondary | ICD-10-CM | POA: Diagnosis present

## 2021-04-01 DIAGNOSIS — E785 Hyperlipidemia, unspecified: Secondary | ICD-10-CM | POA: Diagnosis present

## 2021-04-01 DIAGNOSIS — N390 Urinary tract infection, site not specified: Secondary | ICD-10-CM | POA: Diagnosis present

## 2021-04-01 DIAGNOSIS — F419 Anxiety disorder, unspecified: Secondary | ICD-10-CM | POA: Diagnosis present

## 2021-04-01 DIAGNOSIS — Z87442 Personal history of urinary calculi: Secondary | ICD-10-CM | POA: Diagnosis not present

## 2021-04-01 DIAGNOSIS — G2581 Restless legs syndrome: Secondary | ICD-10-CM | POA: Diagnosis present

## 2021-04-01 DIAGNOSIS — Z87440 Personal history of urinary (tract) infections: Secondary | ICD-10-CM | POA: Diagnosis not present

## 2021-04-01 DIAGNOSIS — Z7901 Long term (current) use of anticoagulants: Secondary | ICD-10-CM | POA: Diagnosis not present

## 2021-04-01 DIAGNOSIS — Z972 Presence of dental prosthetic device (complete) (partial): Secondary | ICD-10-CM | POA: Diagnosis not present

## 2021-04-01 DIAGNOSIS — A419 Sepsis, unspecified organism: Secondary | ICD-10-CM | POA: Diagnosis present

## 2021-04-01 DIAGNOSIS — F17210 Nicotine dependence, cigarettes, uncomplicated: Secondary | ICD-10-CM | POA: Diagnosis present

## 2021-04-01 DIAGNOSIS — I714 Abdominal aortic aneurysm, without rupture, unspecified: Secondary | ICD-10-CM | POA: Diagnosis present

## 2021-04-01 DIAGNOSIS — I482 Chronic atrial fibrillation, unspecified: Secondary | ICD-10-CM | POA: Diagnosis present

## 2021-04-01 DIAGNOSIS — N210 Calculus in bladder: Secondary | ICD-10-CM | POA: Diagnosis present

## 2021-04-01 DIAGNOSIS — F32A Depression, unspecified: Secondary | ICD-10-CM | POA: Diagnosis present

## 2021-04-01 DIAGNOSIS — I110 Hypertensive heart disease with heart failure: Secondary | ICD-10-CM | POA: Diagnosis present

## 2021-04-01 DIAGNOSIS — D696 Thrombocytopenia, unspecified: Secondary | ICD-10-CM | POA: Diagnosis present

## 2021-04-01 DIAGNOSIS — G9341 Metabolic encephalopathy: Secondary | ICD-10-CM | POA: Diagnosis present

## 2021-04-01 DIAGNOSIS — B9689 Other specified bacterial agents as the cause of diseases classified elsewhere: Secondary | ICD-10-CM | POA: Diagnosis present

## 2021-04-01 DIAGNOSIS — Z79899 Other long term (current) drug therapy: Secondary | ICD-10-CM | POA: Diagnosis not present

## 2021-04-01 HISTORY — DX: Urinary tract infection, site not specified: N39.0

## 2021-04-01 LAB — CBC WITH DIFFERENTIAL/PLATELET
Abs Immature Granulocytes: 0.07 10*3/uL (ref 0.00–0.07)
Abs Immature Granulocytes: 0.05 10*3/uL (ref 0.00–0.07)
Basophils Absolute: 0 10*3/uL (ref 0.0–0.1)
Basophils Absolute: 0 10*3/uL (ref 0.0–0.1)
Basophils Relative: 0 %
Basophils Relative: 0 %
Eosinophils Absolute: 0.1 10*3/uL (ref 0.0–0.5)
Eosinophils Absolute: 0.1 10*3/uL (ref 0.0–0.5)
Eosinophils Relative: 0 %
Eosinophils Relative: 1 %
HCT: 42.9 % (ref 39.0–52.0)
HCT: 39.5 % (ref 39.0–52.0)
Hemoglobin: 14.8 g/dL (ref 13.0–17.0)
Hemoglobin: 13.6 g/dL (ref 13.0–17.0)
Immature Granulocytes: 0 %
Immature Granulocytes: 0 %
Lymphocytes Relative: 12 %
Lymphocytes Relative: 17 %
Lymphs Abs: 2 10*3/uL (ref 0.7–4.0)
Lymphs Abs: 2.5 10*3/uL (ref 0.7–4.0)
MCH: 31.9 pg (ref 26.0–34.0)
MCH: 32.5 pg (ref 26.0–34.0)
MCHC: 34.5 g/dL (ref 30.0–36.0)
MCHC: 34.4 g/dL (ref 30.0–36.0)
MCV: 92.5 fL (ref 80.0–100.0)
MCV: 94.3 fL (ref 80.0–100.0)
Monocytes Absolute: 1.3 10*3/uL — ABNORMAL HIGH (ref 0.1–1.0)
Monocytes Absolute: 0.9 10*3/uL (ref 0.1–1.0)
Monocytes Relative: 8 %
Monocytes Relative: 6 %
Neutro Abs: 12.6 10*3/uL — ABNORMAL HIGH (ref 1.7–7.7)
Neutro Abs: 10.9 10*3/uL — ABNORMAL HIGH (ref 1.7–7.7)
Neutrophils Relative %: 80 %
Neutrophils Relative %: 76 %
Platelets: 130 10*3/uL — ABNORMAL LOW (ref 150–400)
Platelets: 117 10*3/uL — ABNORMAL LOW (ref 150–400)
RBC: 4.64 MIL/uL (ref 4.22–5.81)
RBC: 4.19 MIL/uL — ABNORMAL LOW (ref 4.22–5.81)
RDW: 13.5 % (ref 11.5–15.5)
RDW: 13.4 % (ref 11.5–15.5)
WBC: 16 10*3/uL — ABNORMAL HIGH (ref 4.0–10.5)
WBC: 14.5 10*3/uL — ABNORMAL HIGH (ref 4.0–10.5)
nRBC: 0 % (ref 0.0–0.2)
nRBC: 0 % (ref 0.0–0.2)

## 2021-04-01 LAB — HEPARIN LEVEL (UNFRACTIONATED)
Heparin Unfractionated: <0.1 IU/mL — ABNORMAL LOW (ref 0.30–0.70)
Heparin Unfractionated: 0.33 IU/mL (ref 0.30–0.70)
Heparin Unfractionated: 0.23 IU/mL — ABNORMAL LOW (ref 0.30–0.70)

## 2021-04-01 LAB — COMPREHENSIVE METABOLIC PANEL
ALT: 13 U/L (ref 0–44)
ALT: 11 U/L (ref 0–44)
AST: 14 U/L — ABNORMAL LOW (ref 15–41)
AST: 13 U/L — ABNORMAL LOW (ref 15–41)
Albumin: 3.5 g/dL (ref 3.5–5.0)
Albumin: 3 g/dL — ABNORMAL LOW (ref 3.5–5.0)
Alkaline Phosphatase: 46 U/L (ref 38–126)
Alkaline Phosphatase: 42 U/L (ref 38–126)
Anion gap: 9 (ref 5–15)
Anion gap: 8 (ref 5–15)
BUN: 20 mg/dL (ref 8–23)
BUN: 19 mg/dL (ref 8–23)
CO2: 20 mmol/L — ABNORMAL LOW (ref 22–32)
CO2: 23 mmol/L (ref 22–32)
Calcium: 8.2 mg/dL — ABNORMAL LOW (ref 8.9–10.3)
Calcium: 8.1 mg/dL — ABNORMAL LOW (ref 8.9–10.3)
Chloride: 106 mmol/L (ref 98–111)
Chloride: 107 mmol/L (ref 98–111)
Creatinine, Ser: 1.14 mg/dL (ref 0.61–1.24)
Creatinine, Ser: 1.18 mg/dL (ref 0.61–1.24)
GFR, Estimated: >60 mL/min (ref >60)
GFR, Estimated: >60 mL/min (ref >60)
Glucose, Bld: 104 mg/dL — ABNORMAL HIGH (ref 70–99)
Glucose, Bld: 112 mg/dL — ABNORMAL HIGH (ref 70–99)
Potassium: 3.8 mmol/L (ref 3.5–5.1)
Potassium: 3.7 mmol/L (ref 3.5–5.1)
Sodium: 135 mmol/L (ref 135–145)
Sodium: 138 mmol/L (ref 135–145)
Total Bilirubin: 0.8 mg/dL (ref 0.3–1.2)
Total Bilirubin: 0.7 mg/dL (ref 0.3–1.2)
Total Protein: 6.4 g/dL — ABNORMAL LOW (ref 6.5–8.1)
Total Protein: 5.8 g/dL — ABNORMAL LOW (ref 6.5–8.1)

## 2021-04-01 LAB — LACTIC ACID, PLASMA
Lactic Acid, Venous: 1.1 mmol/L (ref 0.5–1.9)
Lactic Acid, Venous: 1.4 mmol/L (ref 0.5–1.9)
Lactic Acid, Venous: 1.4 mmol/L (ref 0.5–1.9)

## 2021-04-01 LAB — URINALYSIS, ROUTINE W REFLEX MICROSCOPIC
Bilirubin Urine: NEGATIVE
Glucose, UA: NEGATIVE mg/dL
Ketones, ur: NEGATIVE mg/dL
Nitrite: POSITIVE — AB
Protein, ur: 100 mg/dL — AB
RBC / HPF: >50 RBC/hpf — ABNORMAL HIGH (ref 0–5)
Specific Gravity, Urine: 1.011 (ref 1.005–1.030)
pH: 7 (ref 5.0–8.0)

## 2021-04-01 LAB — APTT: aPTT: 29 seconds (ref 24–36)

## 2021-04-01 LAB — RESP PANEL BY RT-PCR (FLU A&B, COVID) ARPGX2
Influenza A by PCR: NEGATIVE
Influenza B by PCR: NEGATIVE
SARS Coronavirus 2 by RT PCR: NEGATIVE

## 2021-04-01 LAB — PROTIME-INR
INR: 1 (ref 0.8–1.2)
Prothrombin Time: 13.5 seconds (ref 11.4–15.2)

## 2021-04-01 MED ORDER — HEPARIN (PORCINE) 25000 UT/250ML-% IV SOLN
1250.0000 [IU]/h | INTRAVENOUS | Status: DC
  Administered 2021-04-01 (×2): 1100 [IU]/h via INTRAVENOUS
  Filled 2021-04-01 (×3): qty 250

## 2021-04-01 MED ORDER — SERTRALINE HCL 50 MG PO TABS
150.0000 mg | ORAL_TABLET | Freq: Every day | ORAL | Status: DC
  Administered 2021-04-01 – 2021-04-02 (×2): 150 mg via ORAL
  Filled 2021-04-01: qty 1
  Filled 2021-04-01: qty 3

## 2021-04-01 MED ORDER — TRAMADOL HCL 50 MG PO TABS
50.0000 mg | ORAL_TABLET | Freq: Every day | ORAL | Status: DC | PRN
  Administered 2021-04-02 – 2021-04-03 (×3): 50 mg via ORAL
  Filled 2021-04-01 (×3): qty 1

## 2021-04-01 MED ORDER — ROPINIROLE HCL 1 MG PO TABS
1.0000 mg | ORAL_TABLET | Freq: Two times a day (BID) | ORAL | Status: DC | PRN
  Administered 2021-04-02 – 2021-04-03 (×4): 1 mg via ORAL
  Filled 2021-04-01 (×5): qty 1

## 2021-04-01 MED ORDER — METOPROLOL SUCCINATE ER 25 MG PO TB24
25.0000 mg | ORAL_TABLET | Freq: Every day | ORAL | Status: DC
  Administered 2021-04-01 – 2021-04-03 (×3): 25 mg via ORAL
  Filled 2021-04-01 (×3): qty 1

## 2021-04-01 MED ORDER — ACETAMINOPHEN 325 MG PO TABS: 650.0000 mg | ORAL_TABLET | Freq: Four times a day (QID) | ORAL | Status: DC | PRN

## 2021-04-01 MED ORDER — DOXAZOSIN MESYLATE 8 MG PO TABS
16.0000 mg | ORAL_TABLET | Freq: Every day | ORAL | Status: DC
  Administered 2021-04-01 – 2021-04-02 (×2): 16 mg via ORAL
  Filled 2021-04-01 (×2): qty 2

## 2021-04-01 MED ORDER — CHLORHEXIDINE GLUCONATE CLOTH 2 % EX PADS
6.0000 | MEDICATED_PAD | Freq: Every day | CUTANEOUS | Status: DC
  Administered 2021-04-01 – 2021-04-03 (×3): 6 via TOPICAL

## 2021-04-01 MED ORDER — CEFTRIAXONE SODIUM 1 G IJ SOLR
1.0000 g | Freq: Once | INTRAMUSCULAR | Status: AC
  Administered 2021-04-01: 1 g via INTRAVENOUS
  Filled 2021-04-01: qty 10

## 2021-04-01 MED ORDER — ROPINIROLE HCL 1 MG PO TABS: 1.0000 mg | ORAL_TABLET | Freq: Two times a day (BID) | ORAL | Status: DC

## 2021-04-01 MED ORDER — LACTATED RINGERS IV BOLUS (SEPSIS)
1000.0000 mL | Freq: Once | INTRAVENOUS | Status: AC
  Administered 2021-04-01: 1000 mL via INTRAVENOUS

## 2021-04-01 MED ORDER — SODIUM CHLORIDE 0.9 % IV BOLUS
500.0000 mL | Freq: Once | INTRAVENOUS | Status: AC
  Administered 2021-04-01: 500 mL via INTRAVENOUS

## 2021-04-01 MED ORDER — HEPARIN BOLUS VIA INFUSION
1000.0000 [IU] | Freq: Once | INTRAVENOUS | Status: AC
  Administered 2021-04-02: 1000 [IU] via INTRAVENOUS
  Filled 2021-04-01: qty 1000

## 2021-04-01 MED ORDER — SODIUM CHLORIDE 0.9 % IV SOLN
2.0000 g | Freq: Once | INTRAVENOUS | Status: AC
  Administered 2021-04-01: 2 g via INTRAVENOUS
  Filled 2021-04-01: qty 2

## 2021-04-01 MED ORDER — ONDANSETRON HCL 4 MG/2ML IJ SOLN
4.0000 mg | Freq: Once | INTRAMUSCULAR | Status: AC
  Administered 2021-04-01: 4 mg via INTRAVENOUS
  Filled 2021-04-01: qty 2

## 2021-04-01 MED ORDER — LACTATED RINGERS IV BOLUS (SEPSIS)
500.0000 mL | Freq: Once | INTRAVENOUS | Status: AC
  Administered 2021-04-01: 500 mL via INTRAVENOUS

## 2021-04-01 MED ORDER — ROPINIROLE HCL 1 MG PO TABS
1.0000 mg | ORAL_TABLET | Freq: Once | ORAL | Status: AC
  Administered 2021-04-01: 1 mg via ORAL
  Filled 2021-04-01: qty 1

## 2021-04-01 MED ORDER — LACTATED RINGERS IV SOLN: INTRAVENOUS | Status: DC

## 2021-04-01 MED ORDER — ACETAMINOPHEN 650 MG RE SUPP: 650.0000 mg | Freq: Four times a day (QID) | RECTAL | Status: DC | PRN

## 2021-04-01 MED ORDER — SODIUM CHLORIDE 0.9 % IV SOLN
2.0000 g | Freq: Three times a day (TID) | INTRAVENOUS | Status: DC
  Administered 2021-04-01 – 2021-04-03 (×6): 2 g via INTRAVENOUS
  Filled 2021-04-01 (×7): qty 2

## 2021-04-01 MED ORDER — HEPARIN BOLUS VIA INFUSION
4000.0000 [IU] | Freq: Once | INTRAVENOUS | Status: AC
  Administered 2021-04-01: 4000 [IU] via INTRAVENOUS
  Filled 2021-04-01: qty 4000

## 2021-04-01 MED ORDER — LACTATED RINGERS IV SOLN: INTRAVENOUS | Status: AC

## 2021-04-01 NOTE — Progress Notes (Signed)
[facility] for Heparin Indication: atrial fibrillation  Allergies  Allergen Reactions   Gabapentin     Other reaction(s): Other (See Comments) crazy    Patient Measurements: Height: 5\' 11"  (180.3 cm) Weight: 88.5 kg (195 lb 1.7 oz) IBW/kg (Calculated) : 75.3 Heparin Dosing Weight: TBW  Vital Signs: Temp: 98.2 F (36.8 C) (01/20 2027) Temp Source: Oral (01/20 2027) BP: 122/79 (01/20 2027) Pulse Rate: 79 (01/20 2027)  Labs: Recent Labs    03/31/21 2344 04/01/21 0120 04/01/21 0518 04/01/21 1400 04/01/21 2231  HGB  --  14.8 13.6  --   --   HCT  --  42.9 39.5  --   --   PLT  --  130* 117*  --   --   APTT 29  --   --   --   --   LABPROT 13.5  --   --   --   --   INR 1.0  --   --   --   --   HEPARINUNFRC  --   --  <0.10* 0.33 0.23*  CREATININE  --  1.14 1.18  --   --      Estimated Creatinine Clearance: 62 mL/min (by C-G formula based on SCr of 1.18 mg/dL).   Medical History: Past Medical History:  Diagnosis Date   Abnormal weight gain    Anxiety    Depression    Hyperlipidemia    Hypertrophy of prostate with urinary obstruction and other lower urinary tract symptoms (LUTS)    Insomnia, unspecified    Kidney stone    Mood disorder in conditions classified elsewhere    Other chronic pain    neuropathy pain rt foot post op hip surg   Wears glasses    Wears partial dentures    top and bottom partial    Medications:  Infusions:   ceFEPime (MAXIPIME) IV 2 g (04/01/21 1858)   heparin 1,100 Units/hr (04/01/21 2319)   lactated ringers 100 mL/hr at 04/01/21 1747    Assessment: 71 yo M on Eliquis PTA for Afib.  He is currently holding this medication in anticipation of urological procedure- last dose on 03/23/21.   Baseline labs: CBC: Hg WNL, pltc low/stable at 130, Scr 1.14, INR/aptt at baseline. Heparin level <0.1  Today, 04/01/21  HL is 0.23, now subtherapeutic on IV heparin 1100 units/hr CBC stable, WNL  No line or  bleeding issues per RN   Goal of Therapy:  Heparin level 0.3-0.7 units/ml aPTT 66-102 seconds Monitor platelets by anticoagulation protocol: Yes   Plan:  Rebolus heparin 1000 units x1 then increase to 1250 units/hr Recheck 8h heparin level   Daily heparin level & CBC  Netta Cedars, PharmD, BCPS 04/01/2021 11:45 PM

## 2021-04-01 NOTE — ED Notes (Signed)
ED TO INPATIENT HANDOFF REPORT  Name/Age/Gender Carlos Short. 71 y.o. male  Code Status    Code Status Orders  (From admission, onward)           Start     Ordered   04/01/21 0409  Full code  Continuous        04/01/21 0409           Code Status History     Date Active Date Inactive Code Status Order ID Comments User Context   12/31/2020 1523 01/03/2021 1812 Full Code [id number]  Bobette Mo, MD ED   06/13/2017 1555 06/14/2017 1441 Full Code [id number]  Renae Fickle, MD Inpatient       Home/SNF/Other Home  Chief Complaint Sepsis [facility]) [A41.9] Complicated urinary tract infection [N39.0]  Level of Care/Admitting Diagnosis ED Disposition     ED Disposition  Admit   Condition  --   Comment  Hospital Area: [facility] [[id number]]  Level of Care: Med-Surg [16]  May admit patient to [facility] or [facility] if equivalent level of care is available:: No  Covid Evaluation: Confirmed COVID Negative  Date Laboratory Confirmed COVID Negative: 04/01/2021  Diagnosis: Complicated urinary tract infection [970263]  Admitting Physician: Rhetta Mura [[id number]]  Attending Physician: Rhetta Mura [phone]  Estimated length of stay: 3 - 4 days  Certification:: I certify there are rare and unusual circumstances requiring inpatient admission          Medical History Past Medical History:  Diagnosis Date   Abnormal weight gain    Anxiety    Depression    Hyperlipidemia    Hypertrophy of prostate with urinary obstruction and other lower urinary tract symptoms (LUTS)    Insomnia, unspecified    Kidney stone    Mood disorder in conditions classified elsewhere    Other chronic pain    neuropathy pain rt foot post op hip surg   Wears glasses    Wears partial dentures    top and bottom partial    Allergies Allergies  Allergen Reactions   Gabapentin     Other reaction(s): Other (See Comments) crazy    IV  Location/Drains/Wounds Patient Lines/Drains/Airways Status     Active Line/Drains/Airways     Name Placement date Placement time Site Days   Peripheral IV 03/31/21 20 G Anterior;Proximal;Right Forearm 03/31/21  2349  Forearm  1   Peripheral IV 04/01/21 20 G Anterior;Left Forearm 04/01/21  0220  Forearm  less than 1   Urethral Catheter J.Cassidy Tabet Coude 18 Fr. 04/01/21  0011  Coude  less than 1   Incision - 3 Ports Abdomen 1: Mid;Upper 2: Mid;Medial 3: Mid;Lower 08/16/12  1300  -- 3150            Labs/Imaging Results for orders placed or performed during the hospital encounter of 03/31/21 (from the past 48 hour(s))  Urinalysis, Routine w reflex microscopic Urine, Clean Catch     Status: Abnormal   Collection Time: 03/31/21 11:44 PM  Result Value Ref Range   Color, Urine YELLOW YELLOW   APPearance CLOUDY (A) CLEAR   Specific Gravity, Urine 1.011 1.005 - 1.030   pH 7.0 5.0 - 8.0   Glucose, UA NEGATIVE NEGATIVE mg/dL   Hgb urine dipstick LARGE (A) NEGATIVE   Bilirubin Urine NEGATIVE NEGATIVE   Ketones, ur NEGATIVE NEGATIVE mg/dL   Protein, ur 850 (A) NEGATIVE mg/dL   Nitrite POSITIVE (A) NEGATIVE   Leukocytes,Ua LARGE (A) NEGATIVE  RBC / HPF >50 (H) 0 - 5 RBC/hpf   WBC, UA 21-50 0 - 5 WBC/hpf   Bacteria, UA MANY (A) NONE SEEN   Squamous Epithelial / LPF 0-5 0 - 5   WBC Clumps PRESENT    Mucus PRESENT     Comment: Performed at [facility], [address] [place], [place] [address]  Lactic acid, plasma     Status: None   Collection Time: 03/31/21 11:44 PM  Result Value Ref Range   Lactic Acid, Venous 1.4 0.5 - 1.9 mmol/L    Comment: Performed at [facility], [address] [place], [place] [address]  Protime-INR     Status: None   Collection Time: 03/31/21 11:44 PM  Result Value Ref Range   Prothrombin Time 13.5 11.4 - 15.2 seconds   INR 1.0 0.8 - 1.2    Comment: (NOTE) INR goal varies based on device and disease states. Performed  at [facility], [address] [place], [place] [address]   APTT     Status: None   Collection Time: 03/31/21 11:44 PM  Result Value Ref Range   aPTT 29 24 - 36 seconds    Comment: Performed at [facility], [address] [place], [place] [address]  Blood Culture (routine x 2)     Status: None (Preliminary result)   Collection Time: 03/31/21 11:45 PM   Specimen: BLOOD RIGHT FOREARM  Result Value Ref Range   Specimen Description      BLOOD RIGHT FOREARM Performed at [facility], [address], [place], [place] [address]    Special Requests      BOTTLES DRAWN AEROBIC AND ANAEROBIC Blood Culture adequate volume Performed at [facility], [address] [place], [place] [address]    Culture      NO GROWTH < 12 HOURS Performed at [facility], [address], [place], [place] [address]    Report Status PENDING   Blood Culture (routine x 2)     Status: None (Preliminary result)   Collection Time: 04/01/21 12:15 AM   Specimen: BLOOD LEFT ARM  Result Value Ref Range   Specimen Description      BLOOD LEFT ARM Performed at [facility], [address] [place], [place] [address]    Special Requests      BOTTLES DRAWN AEROBIC AND ANAEROBIC Blood Culture adequate volume Performed at [facility], [address], [place], [place] [address]    Culture      NO GROWTH < 12 HOURS Performed at [facility], [address], [place], [place] [address]    Report Status PENDING   CBC with Differential     Status: Abnormal   Collection Time: 04/01/21  1:20 AM  Result Value Ref Range   WBC 16.0 (H) 4.0 - 10.5 K/uL   RBC 4.64 4.22 - 5.81 MIL/uL   Hemoglobin 14.8 13.0 - 17.0 g/dL   HCT 02.7 25.3 - 66.4 %   MCV 92.5 80.0 - 100.0 fL   MCH 31.9 26.0 - 34.0 pg   MCHC 34.5 30.0 - 36.0 g/dL   RDW 40.3 47.4 - 25.9 %   Platelets 130 (L) 150 - 400 K/uL    Comment:  CONSISTENT WITH PREVIOUS RESULT REPEATED TO VERIFY    nRBC 0.0 0.0 - 0.2 %   Neutrophils Relative % 80 %   Neutro Abs 12.6 (H)  1.7 - 7.7 K/uL   Lymphocytes Relative 12 %   Lymphs Abs 2.0 0.7 - 4.0 K/uL   Monocytes Relative 8 %   Monocytes Absolute 1.3 (H) 0.1 - 1.0 K/uL   Eosinophils Relative 0 %   Eosinophils Absolute 0.1 0.0 - 0.5 K/uL   Basophils Relative 0 %   Basophils Absolute 0.0 0.0 - 0.1 K/uL   Immature Granulocytes 0 %   Abs Immature Granulocytes 0.07 0.00 - 0.07 K/uL    Comment: Performed at [facility], [address] [place], [place] [address]  Comprehensive metabolic panel     Status: Abnormal   Collection Time: 04/01/21  1:20 AM  Result Value Ref Range   Sodium 135 135 - 145 mmol/L   Potassium 3.8 3.5 - 5.1 mmol/L   Chloride 106 98 - 111 mmol/L   CO2 20 (L) 22 - 32 mmol/L   Glucose, Bld 104 (H) 70 - 99 mg/dL    Comment: Glucose reference range applies only to samples taken after fasting for at least 8 hours.   BUN 20 8 - 23 mg/dL   Creatinine, Ser 6.041.14 0.61 - 1.24 mg/dL   Calcium 8.2 (L) 8.9 - 10.3 mg/dL   Total Protein 6.4 (L) 6.5 - 8.1 g/dL   Albumin 3.5 3.5 - 5.0 g/dL   AST 14 (L) 15 - 41 U/L   ALT 13 0 - 44 U/L   Alkaline Phosphatase 46 38 - 126 U/L   Total Bilirubin 0.8 0.3 - 1.2 mg/dL   GFR, Estimated >54>60 >09>60 mL/min    Comment: (NOTE) Calculated using the CKD-EPI Creatinine Equation (2021)    Anion gap 9 5 - 15    Comment: Performed at [facility] [place] [facility], [address] [place], [place] [address]  CBC with Differential     Status: Abnormal   Collection Time: 04/01/21  5:18 AM  Result Value Ref Range   WBC 14.5 (H) 4.0 - 10.5 K/uL   RBC 4.19 (L) 4.22 - 5.81 MIL/uL   Hemoglobin 13.6 13.0 - 17.0 g/dL   HCT 47.839.5 29.539.0 - 62.152.0 %   MCV 94.3 80.0 - 100.0 fL   MCH 32.5 26.0 - 34.0 pg   MCHC 34.4 30.0 - 36.0 g/dL   RDW 30.813.4 65.711.5 - 84.615.5 %   Platelets 117 (L) 150 - 400 K/uL    Comment: SPECIMEN CHECKED FOR CLOTS Immature  Platelet Fraction may be clinically indicated, consider ordering this additional test [id number] REPEATED TO VERIFY PLATELET COUNT CONFIRMED BY SMEAR    nRBC 0.0 0.0 - 0.2 %   Neutrophils Relative % 76 %   Neutro Abs 10.9 (H) 1.7 - 7.7 K/uL   Lymphocytes Relative 17 %   Lymphs Abs 2.5 0.7 - 4.0 K/uL   Monocytes Relative 6 %   Monocytes Absolute 0.9 0.1 - 1.0 K/uL   Eosinophils Relative 1 %   Eosinophils Absolute 0.1 0.0 - 0.5 K/uL   Basophils Relative 0 %   Basophils Absolute 0.0 0.0 - 0.1 K/uL   Immature Granulocytes 0 %   Abs Immature Granulocytes 0.05 0.00 - 0.07 K/uL    Comment: Performed at [facility], [address] [place], [place] [address]  Comprehensive metabolic panel     Status: Abnormal   Collection Time: 04/01/21  5:18 AM  Result Value Ref Range   Sodium 138 135 - 145 mmol/L   Potassium 3.7 3.5 - 5.1 mmol/L   Chloride 107 98 - 111  mmol/L   CO2 23 22 - 32 mmol/L   Glucose, Bld 112 (H) 70 - 99 mg/dL    Comment: Glucose reference range applies only to samples taken after fasting for at least 8 hours.   BUN 19 8 - 23 mg/dL   Creatinine, Ser 1.61 0.61 - 1.24 mg/dL   Calcium 8.1 (L) 8.9 - 10.3 mg/dL   Total Protein 5.8 (L) 6.5 - 8.1 g/dL   Albumin 3.0 (L) 3.5 - 5.0 g/dL   AST 13 (L) 15 - 41 U/L   ALT 11 0 - 44 U/L   Alkaline Phosphatase 42 38 - 126 U/L   Total Bilirubin 0.7 0.3 - 1.2 mg/dL   GFR, Estimated >09 >60 mL/min    Comment: (NOTE) Calculated using the CKD-EPI Creatinine Equation (2021)    Anion gap 8 5 - 15    Comment: Performed at [facility], [address] [place], [place] [address]  Lactic acid, plasma     Status: None   Collection Time: 04/01/21  5:18 AM  Result Value Ref Range   Lactic Acid, Venous 1.4 0.5 - 1.9 mmol/L    Comment: Performed at [facility], [address], [place], [place] [address]  Heparin level (unfractionated)     Status: Abnormal   Collection Time: 04/01/21  5:18  AM  Result Value Ref Range   Heparin Unfractionated <0.10 (L) 0.30 - 0.70 IU/mL    Comment: (NOTE) The clinical reportable range upper limit is being lowered to >1.10 to align with the FDA approved guidance for the current laboratory assay.  If heparin results are below expected values, and patient dosage has  been confirmed, suggest follow up testing of antithrombin III levels. Performed at [facility], [address], [place], [place] [address]   Resp Panel by RT-PCR (Flu A&B, Covid) Peripheral     Status: None   Collection Time: 04/01/21 11:48 AM   Specimen: Peripheral; Nasopharyngeal(NP) swabs in vial transport medium  Result Value Ref Range   SARS Coronavirus 2 by RT PCR NEGATIVE NEGATIVE    Comment: (NOTE) SARS-CoV-2 target nucleic acids are NOT DETECTED.  The SARS-CoV-2 RNA is generally detectable in upper respiratory specimens during the acute phase of infection. The lowest concentration of SARS-CoV-2 viral copies this assay can detect is 138 copies/mL. A negative result does not preclude SARS-Cov-2 infection and should not be used as the sole basis for treatment or other patient management decisions. A negative result may occur with  improper specimen collection/handling, submission of specimen other than nasopharyngeal swab, presence of viral mutation(s) within the areas targeted by this assay, and inadequate number of viral copies(<138 copies/mL). A negative result must be combined with clinical observations, patient history, and epidemiological information. The expected result is Negative.  Fact Sheet for Patients:  BloggerCourse.com  Fact Sheet for Healthcare Providers:  SeriousBroker.it  This test is no t yet approved or cleared by the [place] FDA and  has been authorized for detection and/or diagnosis of SARS-CoV-2 by FDA under an Emergency Use Authorization (EUA). This EUA will remain   in effect (meaning this test can be used) for the duration of the COVID-19 declaration under Section 564(b)(1) of the Act, 21 U.S.C.section 360bbb-3(b)(1), unless the authorization is terminated  or revoked sooner.       Influenza A by PCR NEGATIVE NEGATIVE   Influenza B by PCR NEGATIVE NEGATIVE    Comment: (NOTE) The Xpert Xpress SARS-CoV-2/FLU/RSV plus assay is  intended as an aid in the diagnosis of influenza from Nasopharyngeal swab specimens and should not be used as a sole basis for treatment. Nasal washings and aspirates are unacceptable for Xpert Xpress SARS-CoV-2/FLU/RSV testing.  Fact Sheet for Patients: BloggerCourse.com  Fact Sheet for Healthcare Providers: SeriousBroker.it  This test is not yet approved or cleared by the [place] FDA and has been authorized for detection and/or diagnosis of SARS-CoV-2 by FDA under an Emergency Use Authorization (EUA). This EUA will remain in effect (meaning this test can be used) for the duration of the COVID-19 declaration under Section 564(b)(1) of the Act, 21 U.S.C. section 360bbb-3(b)(1), unless the authorization is terminated or revoked.  Performed at [facility], [address], [place], [place] [address]   Heparin level (unfractionated)     Status: None   Collection Time: 04/01/21  2:00 PM  Result Value Ref Range   Heparin Unfractionated 0.33 0.30 - 0.70 IU/mL    Comment: (NOTE) The clinical reportable range upper limit is being lowered to >1.10 to align with the FDA approved guidance for the current laboratory assay.  If heparin results are below expected values, and patient dosage has  been confirmed, suggest follow up testing of antithrombin III levels. Performed at [facility], [address], [place], [place] [address]    DG Chest 2 View  Result Date: 03/31/2021 CLINICAL DATA:  Fever. EXAM: CHEST - 2 VIEW  COMPARISON:  12/31/2020. FINDINGS: The heart size and mediastinal contours are within normal limits. Both lungs are clear. Degenerative changes are present in the thoracic spine. IMPRESSION: No active cardiopulmonary disease. Electronically Signed   By: Thornell Sartorius M.D.   On: 03/31/2021 23:34   CT RENAL STONE STUDY  Result Date: 04/01/2021 CLINICAL DATA:  Nephrolithiasis. EXAM: CT ABDOMEN AND PELVIS WITHOUT CONTRAST TECHNIQUE: Multidetector CT imaging of the abdomen and pelvis was performed following the standard protocol without IV contrast. RADIATION DOSE REDUCTION: This exam was performed according to the departmental dose-optimization program which includes automated exposure control, adjustment of the mA and/or kV according to patient size and/or use of iterative reconstruction technique. COMPARISON:  CT examination dated May 29, 2019 FINDINGS: Lower chest: No acute abnormality. Hepatobiliary: Stable hypodensity about the dome of the diaphragm and in the right hepatic lobe adjacent to the gallbladder fossa, likely cysts, unchanged. No gallstones, gallbladder wall thickening, or biliary dilatation. Pancreas: Moderate pancreatic atrophy. No pancreatic ductal dilatation or surrounding inflammatory changes. Spleen: Normal in size with punctate calcifications, likely sequela of prior granulomatous infection. Adrenals/Urinary Tract: Adrenal glands are unremarkable. There are multiple calculi in the left kidney the largest measuring up to 1.3 cm. There is moderate left hydronephrosis and left perinephric fat stranding. The left ureter is prominent with mild surrounding fat stranding. No appreciable calculus in the left ureter, however evaluation of distal left ureter is limited due to streak artifact from right hip arthroplasty. There is however a calculus in the lumen of the urinary bladder adjacent to the [facility] catheter balloon measuring approximatelyr 6 mm. There is another large calculus in the pelvis or  in the lumen of the urinary bladder, evaluation is limited due to streak artifact. No appreciable calculus in the right kidney. Right ureter is unremarkable. The urinary bladder is collapsed about the [facility] catheter limiting evaluation. Stomach/Bowel: Stomach is within normal limits. Appendix appears normal. No evidence of bowel wall thickening, distention, or inflammatory changes. Vascular/Lymphatic: Fusiform aneurysmal dilatation of the infrarenal abdominal aorta measuring up to 4.4  cm (series 2, image 39), which have increased in size since recent prior examination measuring 4 cm. Reproductive: Prostate is unremarkable. Other: Fat containing bilateral inguinal hernias. No abdominopelvic ascites. Musculoskeletal: Stable compression deformity of L1 vertebral body as well as prominent Schmorl node in the inferior endplate of the L2 vertebral body. Moderate multilevel degenerative disc disease. Diffuse osteopenia. Status post right hip arthroplasty. IMPRESSION: 1. Left hydronephrosis and hydroureter with left perinephric fat stranding without evidence of ureteral calculus and a small 6 mm calculus in the left urinary bladder lumen, findings likely represent recently passed calculus. 2. Evaluation of the urinary bladder is somewhat limited due to streak artifact from right hip arthroplasty. No right hydronephrosis or ureteral calculus. 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 3. 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 Continued short-term follow-up examination is recommended. 4.  Colonic diverticulosis without evidence of acute diverticulitis. 5. Compression deformity of L1 and L2 vertebral bodies, unchanged. Status post right hip arthroplasty with intact hardware. Electronically Signed   By: Larose Hires D.O.   On: 04/01/2021 16:01    Pending Labs Unresulted Labs (From admission,  onward)     Start     Ordered   04/01/21 2200  Heparin level (unfractionated)  Once,   R        04/01/21 1512   04/01/21 0408  Lactic acid, plasma  STAT Now then every 2 hours,   STAT      04/01/21 0409   03/31/21 2312  Urine Culture  Once,   STAT       Question:  Indication  Answer:  Altered mental status (if no other cause identified)   03/31/21 2314            Vitals/Pain Today's Vitals   04/01/21 1400 04/01/21 1430 04/01/21 1532 04/01/21 1620  BP: 133/72 129/66 (!) 149/74 117/67  Pulse: 78 77 85 78  Resp: 17 18 18  (!) 22  Temp:    99.2 F (37.3 C)  TempSrc:    Oral  SpO2: 97% 96% 98% 98%  Weight:      Height:      PainSc:   0-No pain     Isolation Precautions No active isolations  Medications Medications  lactated ringers infusion ( Intravenous Rate/Dose Verify 04/01/21 1541)  traMADol (ULTRAM) tablet 50 mg (has no administration in time range)  doxazosin (CARDURA) tablet 16 mg (16 mg Oral Given 04/01/21 0951)  metoprolol succinate (TOPROL-XL) 24 hr tablet 25 mg (25 mg Oral Given 04/01/21 0952)  sertraline (ZOLOFT) tablet 150 mg (150 mg Oral Given 04/01/21 0952)  rOPINIRole (REQUIP) tablet 1 mg (has no administration in time range)  acetaminophen (TYLENOL) tablet 650 mg (has no administration in time range)    Or  acetaminophen (TYLENOL) suppository 650 mg (has no administration in time range)  ceFEPIme (MAXIPIME) 2 g in sodium chloride 0.9 % 100 mL IVPB (0 g Intravenous Stopped 04/01/21 1541)  heparin ADULT infusion 100 units/mL (25000 units/244mL) (1,100 Units/hr Intravenous Rate/Dose Verify 04/01/21 1541)  sodium chloride 0.9 % bolus 500 mL (0 mLs Intravenous Stopped 04/01/21 0207)  cefTRIAXone (ROCEPHIN) 1 g in sodium chloride 0.9 % 100 mL IVPB (0 g Intravenous Stopped 04/01/21 0207)  ceFEPIme (MAXIPIME) 2 g in sodium chloride 0.9 % 100 mL IVPB (0 g Intravenous Stopped 04/01/21 0346)  lactated ringers bolus 1,000 mL (0 mLs Intravenous  Stopped 04/01/21 0346)    And   lactated ringers bolus 1,000 mL (0 mLs Intravenous Stopped 04/01/21 0437)    And  lactated ringers bolus 500 mL (0 mLs Intravenous Stopped 04/01/21 0437)  rOPINIRole (REQUIP) tablet 1 mg (1 mg Oral Given 04/01/21 0341)  heparin bolus via infusion 4,000 Units (4,000 Units Intravenous Bolus from Bag 04/01/21 0609)    Mobility walks with person assist

## 2021-04-01 NOTE — Progress Notes (Signed)
Patient seen and admitted after midnight  71 year old white male known history HFpEF, paroxysmal A. fib, chronic thrombocytopenia, history of AAA, sciatica low back pain  Came in with sepsis likely secondary to UTI-T-max 99 and tachycardic Foley catheter placed   O/e BP (!) 141/71    Pulse 79    Temp 99.8 F (37.7 C) (Oral)    Resp (!) 22    Ht 5\' 11"  (1.803 m)    Wt 81.6 kg    SpO2 98%    BMI 25.10 kg/m   Awake but slightly confused-multiple tattoos CTA B no added sound no rales rhonchi Abdomen soft no rebound No CVA tenderness No lower extremity edema S1-S2 no murmur ROM intact  P Obtain CT renal stone study If no nephrolithiasis, likely can treat with empiric antibiotics and discharged home in several days   He will need to stay overnight and will be appropriate for inpatient stay  No charge  , MD [facility] 2:40 PM

## 2021-04-01 NOTE — Progress Notes (Signed)
ANTICOAGULATION CONSULT NOTE -   Consult  Pharmacy Consult for Heparin Indication: atrial fibrillation  Allergies  Allergen Reactions   Gabapentin     Other reaction(s): Other (See Comments) crazy    Patient Measurements: Height: 5\' 11"  (180.3 cm) Weight: 81.6 kg (180 lb) IBW/kg (Calculated) : 75.3 Heparin Dosing Weight: TBW  Vital Signs: BP: 133/72 (01/20 1400) Pulse Rate: 78 (01/20 1400)  Labs: Recent Labs    03/31/21 2344 04/01/21 0120 04/01/21 0518 04/01/21 1400  HGB  --  14.8 13.6  --   HCT  --  42.9 39.5  --   PLT  --  130* 117*  --   APTT 29  --   --   --   LABPROT 13.5  --   --   --   INR 1.0  --   --   --   HEPARINUNFRC  --   --  <0.10* 0.33  CREATININE  --  1.14 1.18  --      Estimated Creatinine Clearance: 62 mL/min (by C-G formula based on SCr of 1.18 mg/dL).   Medical History: Past Medical History:  Diagnosis Date   Abnormal weight gain    Anxiety    Depression    Hyperlipidemia    Hypertrophy of prostate with urinary obstruction and other lower urinary tract symptoms (LUTS)    Insomnia, unspecified    Kidney stone    Mood disorder in conditions classified elsewhere    Other chronic pain    neuropathy pain rt foot post op hip surg   Wears glasses    Wears partial dentures    top and bottom partial    Medications:  Infusions:   ceFEPime (MAXIPIME) IV 2 g (04/01/21 0951)   heparin 1,100 Units/hr (04/01/21 0609)   lactated ringers 100 mL/hr at 04/01/21 0437    Assessment: 71 yo M on Eliquis PTA for Afib.  He is currently holding this medication in anticipation of urological procedure- last dose on 03/23/21.   Baseline labs: CBC: Hg WNL, pltc low/stable at 130, Scr 1.14, INR/aptt at baseline. Heparin level <0.1  Today, 04/01/21  HL is 0.33, therapeutic  CBC stable, WNL  Scr 1.18 mg/dl  No line or bleeding issues per RN   Goal of Therapy:  Heparin level 0.3-0.7 units/ml aPTT 66-102 seconds Monitor platelets by anticoagulation  protocol: Yes   Plan:  Continue 1100 units/hr Obtain confirmatory  8h heparin level   Daily heparin level & CBC F/U plans for surgery  Royetta Asal, PharmD, BCPS 04/01/2021 2:44 PM

## 2021-04-01 NOTE — ED Notes (Signed)
Pt's wife would like update with results as soon as they are available.  Volney Presser [phone]

## 2021-04-01 NOTE — H&P (Signed)
History and Physical    Carlos Short. [id number] DOB: 1950-10-14 DOA: 03/31/2021  PCP: Christain Sacramento, MD  Patient coming from: Home.  Chief Complaint: Fever and confusion.  HPI: Carlos Short. is a 71 y.o. male with history of paroxysmal atrial fibrillation, LV dysfunction, abdominal aortic aneurysm, hypertension, thrombocytopenia admitted in October 2022 for sepsis from UTI and eventually patient was found to have a bladder stone for which patient is scheduled for surgery next week with urology presents to the ER after patient was found to have fever and confusion as noticed by patient's wife.  Symptoms in the 100 last evening around 5 PM.  Patient states he had some nausea.  Denies vomiting.  Denies chest pain or shortness of breath.  Over the last 1 week patient has been doing intermittent self caths.  ED Course: In the ER patient had temperature of 99 F but was given ibuprofen on the way to the hospital.  Was tachycardic initially improved with fluids.  UA is consistent with UTI.  COVID test is pending.  Chest x-ray unremarkable.  Patient was placed on a Foley catheter.  Review of Systems: As per HPI, rest all negative.   Past Medical History:  Diagnosis Date   Abnormal weight gain    Anxiety    Depression    Hyperlipidemia    Hypertrophy of prostate with urinary obstruction and other lower urinary tract symptoms (LUTS)    Insomnia, unspecified    Kidney stone    Mood disorder in conditions classified elsewhere    Other chronic pain    neuropathy pain rt foot post op hip surg   Wears glasses    Wears partial dentures    top and bottom partial    Past Surgical History:  Procedure Laterality Date   INGUINAL HERNIA REPAIR Bilateral 08/16/2012   Procedure: LAPAROSCOPIC BILATERAL INGUINAL HERNIA REPAIR;  Surgeon: Imogene Burn. Georgette Dover, MD;  Location: [facility];  Service: General;  Laterality: Bilateral;   INSERTION OF MESH Bilateral 08/16/2012    Procedure: INSERTION OF MESH;  Surgeon: Imogene Burn. Georgette Dover, MD;  Location: [facility];  Service: General;  Laterality: Bilateral;   MULTIPLE TOOTH EXTRACTIONS     TONSILLECTOMY     TOTAL HIP REVISION Right 2005   UPPER GI ENDOSCOPY       reports that he has been smoking cigarettes. He has a 12.50 pack-year smoking history. He has never used smokeless tobacco. He reports that he does not currently use alcohol. He reports current drug use. Drugs: Cocaine, Marijuana, and Amphetamines.  Allergies  Allergen Reactions   Gabapentin     Other reaction(s): Other (See Comments) crazy    Family History  Problem Relation Age of Onset   Depression Mother    Hypertension Father    Heart attack Father 47       2 HEART ATTACKS   Kidney cancer Father    Lupus Father    Depression Brother    Migraines Neg Hx     Prior to Admission medications   Medication Sig Start Date End Date Taking? Authorizing Provider  ciprofloxacin (CIPRO) 500 MG tablet Take 500 mg by mouth 2 (two) times daily. 03/15/21  Yes [provider]  doxazosin (CARDURA) 8 MG tablet Take 16 mg by mouth daily. 05/03/17  Yes [provider]  fluticasone (FLONASE) 50 MCG/ACT nasal spray Place 1 spray into both nostrils daily as needed for allergies. 12/15/20  Yes [provider]  HYDROmorphone (DILAUDID) 4 MG tablet Take 4 mg by mouth every 4 (four) hours as needed for severe pain or moderate pain (breakthrough pain).   Yes [provider]  ibandronate (BONIVA) 150 MG tablet Take 1 tablet by mouth every 30 (thirty) days. 3rd of each month 11/05/20  Yes [provider]  metoprolol succinate (TOPROL-XL) 25 MG 24 hr tablet Take 1 tablet (25 mg total) by mouth daily. Take with or immediately following a meal. 02/17/21 08/16/21 Yes Cantwell, Celeste C, PA-C  rOPINIRole (REQUIP) 1 MG tablet Take 1 mg by mouth 2 (two) times daily as needed (restless legs). 12/06/20  Yes [provider]   sertraline (ZOLOFT) 100 MG tablet Take 150 mg by mouth daily. 05/03/17  Yes [provider]  traMADol (ULTRAM) 50 MG tablet Take 50 mg by mouth daily as needed for moderate pain.   Yes [provider]  apixaban (ELIQUIS) 5 MG TABS tablet Take 1 tablet (5 mg total) by mouth 2 (two) times daily. Patient not taking: Reported on 04/01/2021 03/18/21   Alethia Berthold, PA-C  dronedarone (MULTAQ) 400 MG tablet Take 1 tablet (400 mg total) by mouth 2 (two) times daily with a meal. Patient not taking: Reported on 04/01/2021 03/31/21   Alethia Berthold, PA-C    Physical Exam: Constitutional: Moderately built and nourished. Vitals:   04/01/21 0130 04/01/21 0215 04/01/21 0230 04/01/21 0315  BP: 109/66 106/61 (!) 105/49 129/75  Pulse: 70 (!) 43 67 68  Resp: (!) 30 (!) 25 (!) 21 (!) 26  Temp:      TempSrc:      SpO2: 97% 97% 98% 98%  Weight:      Height:       Eyes: Anicteric no pallor. ENMT: No discharge from the ears eyes nose and mouth. Neck: No mass felt.  No neck rigidity. Respiratory: No rhonchi or crepitations. Cardiovascular: S1-S2 heard. Abdomen: Soft nontender bowel sound present. Musculoskeletal: No edema. Skin: No rash. Neurologic: Alert awake oriented time place and person.  Moves all extremities. Psychiatric: Appears normal.  Normal affect.   Labs on Admission: I have personally reviewed following labs and imaging studies  CBC: Recent Labs  Lab 04/01/21 0120  WBC 16.0*  NEUTROABS 12.6*  HGB 14.8  HCT 42.9  MCV 92.5  PLT [id number]*   Basic Metabolic Panel: Recent Labs  Lab 04/01/21 0120  NA 135  K 3.8  CL 106  CO2 20*  GLUCOSE 104*  BUN 20  CREATININE 1.14  CALCIUM 8.2*   GFR: Estimated Creatinine Clearance: 64.2 mL/min (by C-G formula based on SCr of 1.14 mg/dL). Liver Function Tests: Recent Labs  Lab 04/01/21 0120  AST 14*  ALT 13  ALKPHOS 46  BILITOT 0.8  PROT 6.4*  ALBUMIN 3.5   No results for input(s): LIPASE, AMYLASE in the  last 168 hours. No results for input(s): AMMONIA in the last 168 hours. Coagulation Profile: Recent Labs  Lab 03/31/21 2344  INR 1.0   Cardiac Enzymes: No results for input(s): CKTOTAL, CKMB, CKMBINDEX, TROPONINI in the last 168 hours. BNP (last 3 results) No results for input(s): PROBNP in the last 8760 hours. HbA1C: No results for input(s): HGBA1C in the last 72 hours. CBG: No results for input(s): GLUCAP in the last 168 hours. Lipid Profile: No results for input(s): CHOL, HDL, LDLCALC, TRIG, CHOLHDL, LDLDIRECT in the last 72 hours. Thyroid Function Tests: No results for input(s): TSH, T4TOTAL, FREET4, T3FREE, THYROIDAB in the last 72  hours. Anemia Panel: No results for input(s): VITAMINB12, FOLATE, FERRITIN, TIBC, IRON, RETICCTPCT in the last 72 hours. Urine analysis:    Component Value Date/Time   COLORURINE YELLOW 03/31/2021 2344   APPEARANCEUR CLOUDY (A) 03/31/2021 2344   LABSPEC 1.011 03/31/2021 2344   PHURINE 7.0 03/31/2021 2344   GLUCOSEU NEGATIVE 03/31/2021 2344   HGBUR LARGE (A) 03/31/2021 2344   BILIRUBINUR NEGATIVE 03/31/2021 [employer] 03/31/2021 2344   PROTEINUR 100 (A) 03/31/2021 2344   NITRITE POSITIVE (A) 03/31/2021 2344   LEUKOCYTESUR LARGE (A) 03/31/2021 2344   Sepsis Labs: @LABRCNTIP (procalcitonin:4,lacticidven:4) )No results found for this or any previous visit (from the past 240 hour(s)).   Radiological Exams on Admission: DG Chest 2 View  Result Date: 03/31/2021 CLINICAL DATA:  Fever. EXAM: CHEST - 2 VIEW COMPARISON:  12/31/2020. FINDINGS: The heart size and mediastinal contours are within normal limits. Both lungs are clear. Degenerative changes are present in the thoracic spine. IMPRESSION: No active cardiopulmonary disease. Electronically Signed   By: Brett Fairy M.D.   On: 03/31/2021 23:34    EKG: Independently reviewed.  Normal sinus rhythm.  Assessment/Plan Principal Problem:   Sepsis ([place]) Active Problems:   Chronic  low back pain without sciatica   Restless leg syndrome   Acute lower UTI   Thrombocytopenia ([facility])   PAF (paroxysmal atrial fibrillation) ([place])    Possible early sepsis from urine tract infection for which patient is placed on antibiotics fluids follow cultures.  As per the report patient does have a urinary bladder stone for which patient was urgently scheduled for surgery next week.  May discuss with urologist in the morning. Paroxysmal atrial fibrillation was recently started on Multaq and apixaban which patient is yet to start.  We will continue with beta-blockers and keep patient on heparin for now. Restless leg syndrome on Requip as needed. LV dysfunction with grade 2 diastolic dysfunction seen in 2D echo in November 2022 by cardiologist.  Vita Barley compensated patient is actually receiving fluids closely monitor respiratory status. Chronic thrombocytopenia follow CBC. History of abdominal arctic aneurysm being followed by cardiologist.  Denies any abdominal pain. Chronic low back pain.  COVID test pending.   DVT prophylaxis: Heparin infusion. Code Status: Full code. Family Communication: Will need to discuss with patient's wife. Disposition Plan: Home. Consults called: None. Admission status: Observation.   Rise Patience MD [facility] Pager [phone].  If 7PM-7AM, please contact night-coverage [email or web address] Password [email or web address]  04/01/2021, 4:10 AM

## 2021-04-01 NOTE — ED Notes (Signed)
Lunch provided.

## 2021-04-01 NOTE — ED Notes (Signed)
Covid swab collected, sent to lab.

## 2021-04-01 NOTE — Progress Notes (Signed)
Pharmacy Antibiotic Note  Carlos Short. is a 71 y.o. male admitted on 03/31/2021 with sepsis and UTI.  Pharmacy has been consulted for Cefepime dosing.  Plan: Cefepime 2gm IV q8h No dose adjustments anticipated.  Pharmacy will sign off and monitor peripherally via electronic surveillance software for any changes in renal function or micro data.   Height: 5\' 11"  (180.3 cm) Weight: 81.6 kg (180 lb) IBW/kg (Calculated) : 75.3  Temp (24hrs), Avg:99.8 F (37.7 C), Min:99.8 F (37.7 C), Max:99.8 F (37.7 C)  Recent Labs  Lab 03/31/21 2344 04/01/21 0120  WBC  --  16.0*  CREATININE  --  1.14  LATICACIDVEN 1.4  --     Estimated Creatinine Clearance: 64.2 mL/min (by C-G formula based on SCr of 1.14 mg/dL).    Allergies  Allergen Reactions   Gabapentin     Other reaction(s): Other (See Comments) crazy    Thank you for allowing pharmacy to be a part of this patients care.  04/03/21 PharmD 04/01/2021 4:47 AM

## 2021-04-01 NOTE — Progress Notes (Signed)
ANTICOAGULATION CONSULT NOTE - Initial Consult  Pharmacy Consult for Heparin Indication: atrial fibrillation  Allergies  Allergen Reactions   Gabapentin     Other reaction(s): Other (See Comments) crazy    Patient Measurements: Height: 5\' 11"  (180.3 cm) Weight: 81.6 kg (180 lb) IBW/kg (Calculated) : 75.3 Heparin Dosing Weight: TBW  Vital Signs: Temp: 99.8 F (37.7 C) (01/19 2236) Temp Source: Oral (01/19 2236) BP: 129/75 (01/20 0315) Pulse Rate: 68 (01/20 0315)  Labs: Recent Labs    03/31/21 2344 04/01/21 0120  HGB  --  14.8  HCT  --  42.9  PLT  --  130*  APTT 29  --   LABPROT 13.5  --   INR 1.0  --   CREATININE  --  1.14    Estimated Creatinine Clearance: 64.2 mL/min (by C-G formula based on SCr of 1.14 mg/dL).   Medical History: Past Medical History:  Diagnosis Date   Abnormal weight gain    Anxiety    Depression    Hyperlipidemia    Hypertrophy of prostate with urinary obstruction and other lower urinary tract symptoms (LUTS)    Insomnia, unspecified    Kidney stone    Mood disorder in conditions classified elsewhere    Other chronic pain    neuropathy pain rt foot post op hip surg   Wears glasses    Wears partial dentures    top and bottom partial    Medications:  Infusions:   ceFEPime (MAXIPIME) IV     lactated ringers 100 mL/hr at 04/01/21 0437    Assessment: 71 yo M on Eliquis PTA for Afib.  He is currently holding this medication in anticipation of urological procedure- last dose on 03/23/21.   Baseline labs: CBC: Hg WNL, pltc low/stable at 130, Scr 1.14, INR/aptt at baseline. Heparin level <0.1  Goal of Therapy:  Heparin level 0.3-0.7 units/ml aPTT 66-102 seconds Monitor platelets by anticoagulation protocol: Yes   Plan:  Heparin 4000 units IV bolus followed by heparin infusion at 1100 units/hr Check 8h heparin level after infusion starts Daily heparin level & CBC F/U plans for surgery  05/21/21 PharmD 04/01/2021,4:35  AM

## 2021-04-01 NOTE — Progress Notes (Signed)
A consult was received from an ED physician for Cefepime per pharmacy dosing.  The patient's profile has been reviewed for ht/wt/allergies/indication/available labs.   A one time order has been placed for Cefepime 2gm IV.  Further antibiotics/pharmacy consults should be ordered by admitting physician if indicated.                       Thank you, Junita Push PharmD 04/01/2021  2:20 AM

## 2021-04-01 NOTE — Sepsis Progress Note (Signed)
Monitoring for the code sepsis protocol. °

## 2021-04-01 NOTE — ED Notes (Signed)
Dinner tray provided

## 2021-04-01 NOTE — Plan of Care (Signed)
  Problem: Education: Goal: Knowledge of General Education information will improve Description Including pain rating scale, medication(s)/side effects and non-pharmacologic comfort measures Outcome: Progressing   

## 2021-04-02 DIAGNOSIS — A419 Sepsis, unspecified organism: Secondary | ICD-10-CM | POA: Diagnosis not present

## 2021-04-02 LAB — COMPREHENSIVE METABOLIC PANEL
ALT: 13 U/L (ref 0–44)
AST: 15 U/L (ref 15–41)
Albumin: 3.1 g/dL — ABNORMAL LOW (ref 3.5–5.0)
Alkaline Phosphatase: 42 U/L (ref 38–126)
Anion gap: 7 (ref 5–15)
BUN: 14 mg/dL (ref 8–23)
CO2: 24 mmol/L (ref 22–32)
Calcium: 8.3 mg/dL — ABNORMAL LOW (ref 8.9–10.3)
Chloride: 106 mmol/L (ref 98–111)
Creatinine, Ser: 0.99 mg/dL (ref 0.61–1.24)
GFR, Estimated: >60 mL/min (ref >60)
Glucose, Bld: 108 mg/dL — ABNORMAL HIGH (ref 70–99)
Potassium: 3.9 mmol/L (ref 3.5–5.1)
Sodium: 137 mmol/L (ref 135–145)
Total Bilirubin: 1.5 mg/dL — ABNORMAL HIGH (ref 0.3–1.2)
Total Protein: 6.1 g/dL — ABNORMAL LOW (ref 6.5–8.1)

## 2021-04-02 LAB — CBC WITH DIFFERENTIAL/PLATELET
Abs Immature Granulocytes: 0.06 10*3/uL (ref 0.00–0.07)
Basophils Absolute: 0 10*3/uL (ref 0.0–0.1)
Basophils Relative: 0 %
Eosinophils Absolute: 0.1 10*3/uL (ref 0.0–0.5)
Eosinophils Relative: 0 %
HCT: 37.3 % — ABNORMAL LOW (ref 39.0–52.0)
Hemoglobin: 13.1 g/dL (ref 13.0–17.0)
Immature Granulocytes: 0 %
Lymphocytes Relative: 14 %
Lymphs Abs: 2.4 10*3/uL (ref 0.7–4.0)
MCH: 32.7 pg (ref 26.0–34.0)
MCHC: 35.1 g/dL (ref 30.0–36.0)
MCV: 93 fL (ref 80.0–100.0)
Monocytes Absolute: 1.4 10*3/uL — ABNORMAL HIGH (ref 0.1–1.0)
Monocytes Relative: 8 %
Neutro Abs: 12.8 10*3/uL — ABNORMAL HIGH (ref 1.7–7.7)
Neutrophils Relative %: 78 %
Platelets: 101 10*3/uL — ABNORMAL LOW (ref 150–400)
RBC: 4.01 MIL/uL — ABNORMAL LOW (ref 4.22–5.81)
RDW: 13.3 % (ref 11.5–15.5)
WBC: 16.7 10*3/uL — ABNORMAL HIGH (ref 4.0–10.5)
nRBC: 0 % (ref 0.0–0.2)

## 2021-04-02 LAB — HEPARIN LEVEL (UNFRACTIONATED): Heparin Unfractionated: 0.43 IU/mL (ref 0.30–0.70)

## 2021-04-02 MED ORDER — MELATONIN 5 MG PO TABS
5.0000 mg | ORAL_TABLET | Freq: Once | ORAL | Status: AC
  Administered 2021-04-02: 5 mg via ORAL
  Filled 2021-04-02: qty 1

## 2021-04-02 MED ORDER — DOXAZOSIN MESYLATE 8 MG PO TABS
8.0000 mg | ORAL_TABLET | Freq: Every day | ORAL | Status: DC
  Administered 2021-04-03: 8 mg via ORAL
  Filled 2021-04-02: qty 1

## 2021-04-02 MED ORDER — APIXABAN 5 MG PO TABS
5.0000 mg | ORAL_TABLET | Freq: Two times a day (BID) | ORAL | Status: DC
  Administered 2021-04-02 – 2021-04-03 (×3): 5 mg via ORAL
  Filled 2021-04-02 (×3): qty 1

## 2021-04-02 MED ORDER — ONDANSETRON 4 MG PO TBDP
4.0000 mg | ORAL_TABLET | Freq: Three times a day (TID) | ORAL | Status: DC
  Administered 2021-04-02 – 2021-04-03 (×3): 4 mg via ORAL
  Filled 2021-04-02 (×3): qty 1

## 2021-04-02 MED ORDER — MELATONIN 5 MG PO TABS
5.0000 mg | ORAL_TABLET | Freq: Once | ORAL | Status: AC
Start: 2021-04-02 — End: 2021-04-02
  Administered 2021-04-02: 5 mg via ORAL
  Filled 2021-04-02: qty 1

## 2021-04-02 MED ORDER — LACTATED RINGERS IV SOLN: INTRAVENOUS | Status: DC

## 2021-04-02 NOTE — Hospital Course (Signed)
IMPRESSION: 1. Left hydronephrosis and hydroureter with left perinephric fat stranding without evidence of ureteral calculus and a small 6 mm calculus in the left urinary bladder lumen, findings likely represent recently passed calculus.   2. Evaluation of the urinary bladder is somewhat limited due to streak artifact from right hip arthroplasty. No right hydronephrosis or ureteral calculus. 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   3. 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 Continued short-term follow-up examination is recommended.   4.  Colonic diverticulosis without evidence of acute diverticulitis.   5. Compression deformity of L1 and L2 vertebral bodies, unchanged. Status post right hip arthroplasty with intact hardware.

## 2021-04-02 NOTE — Progress Notes (Signed)
Patients' vital signs [place] turned to yellow for low heart rate. Recheck vital signs and discussed patient's conditions with charge Nurse [place]. Will continue monitor.

## 2021-04-02 NOTE — Plan of Care (Signed)
  Problem: Education: Goal: Knowledge of General Education information will improve Description Including pain rating scale, medication(s)/side effects and non-pharmacologic comfort measures Outcome: Progressing   Problem: Health Behavior/Discharge Planning: Goal: Ability to manage health-related needs will improve Outcome: Progressing   

## 2021-04-02 NOTE — Progress Notes (Signed)
PROGRESS NOTE   Carlos Short.  Carlos Short DOB: 10/29/1950 DOA: 03/31/2021 PCP: Barbie Banner, MD  Brief Narrative:  71 year old white male known history HFpEF, paroxysmal A. fib, chronic thrombocytopenia, history of AAA, sciatica low back pain   Came in with sepsis likely secondary to UTI-T-max 99 and tachycardic Foley catheter placed    Hospital-Problem based course  Left-sided Staph Epidermidis [in urine? Contam] pyelonephritis likely 2/2 recently passed nephrolithiasis per CT Non obstructive hydronephrosis Continue empiric antibiotics, WBC improved, Creat stable Await BC x 2 Urology has him scheduled for an appointment sometime in the near future He will continue on usual dose of Cardura 16 mg daily Nausea/Vomit Likely 2/2 above--scheduled Zofran 4 q 8 Advance if tolerated his diet  Chr TCP Count slightly lower likely 2/2 infecton-requires outpatient follow-up HfpEF Controlled atrial fibrillation transfer >4 on anticoagulation Heparin started on admission-discontinue as no role for procedures at this time--place back on prior AC-Eliquis 5 twice daily Continue Toprol-XL 25 daily Prior AAA--slightly larger this admit Needs OP [place] ~ 3-6 mo   DVT prophylaxis:  Code Status: Full Family Communication: Discussed with wife on 1/21 at bedside Disposition:  Status is: Inpatient  Remains inpatient appropriate because: Await urine cultures clinical stability and transition to p.o. antibiotics-prior to admission was on ciprofloxacin for 30 days apparently-want to ensure sensitivities are correct given [place] on admission   Consultants:    Procedures:   L hydronephrosis and hydroureter left perinephric fat No ureteral calculus  small 6 mm calculus in the left urinary bladder lumen---recently passed calculus.   Antimicrobials:  Cefepime 1/19   Subjective:  Awake coherent--some confusion last pm No cp fever chills rigors   Objective: Vitals:   04/01/21  1745 04/01/21 2027 04/02/21 0145 04/02/21 0538  BP: 136/64 122/79 118/74 121/67  Pulse:  79 77 (!) 104  Resp: 18 16 18 18   Temp: 99.9 F (37.7 C) 98.2 F (36.8 C) 98.8 F (37.1 C) 98.5 F (36.9 C)  TempSrc: Oral Oral Oral Oral  SpO2: 97% 97% 97% 98%  Weight: 88.5 kg     Height: 5\' 11"  (1.803 m)       Intake/Output Summary (Last 24 hours) at 04/02/2021 0732 Last data filed at 04/02/2021 0600 Gross per 24 hour  Intake 2345.25 ml  Output 4600 ml  Net -2254.75 ml   Filed Weights   03/31/21 2236 04/01/21 1745  Weight: 81.6 kg 88.5 kg    Examination:  Eomi ncat no focal; deficit no ict no pallor Cta b no added sound no rales no rhonchi Abd soft nt nd  Neuro intact Multiple tattoos   Data Reviewed: personally reviewed   CBC    Component Value Date/Time   WBC 14.5 (H) 04/01/2021 0518   RBC 4.19 (L) 04/01/2021 0518   HGB 13.6 04/01/2021 0518   HCT 39.5 04/01/2021 0518   PLT 117 (L) 04/01/2021 0518   MCV 94.3 04/01/2021 0518   MCH 32.5 04/01/2021 0518   MCHC 34.4 04/01/2021 0518   RDW 13.4 04/01/2021 0518   LYMPHSABS 2.5 04/01/2021 0518   MONOABS 0.9 04/01/2021 0518   EOSABS 0.1 04/01/2021 0518   BASOSABS 0.0 04/01/2021 0518   CMP Latest Ref Rng & Units 04/01/2021 04/01/2021 01/03/2021  Glucose 70 - 99 mg/dL 04/03/2021) 01/05/2021) 95  BUN 8 - 23 mg/dL 19 20 15   Creatinine 0.61 - 1.24 mg/dL 607(P 710(G  Sodium 135 - 145 mmol/L 138 135 138  Potassium 3.5 - 5.1 mmol/L 3.7 3.8  3.3(L)  Chloride 98 - 111 mmol/L 107 106 108  CO2 22 - 32 mmol/L 23 20(L) 26  Calcium 8.9 - 10.3 mg/dL 8.1(L) 8.2(L) 8.1(L)  Total Protein 6.5 - 8.1 g/dL 1.6(X5.8(L) 6.4(L) -  Total Bilirubin 0.3 - 1.2 mg/dL 0.7 0.8 -  Alkaline Phos 38 - 126 U/L 42 46 -  AST 15 - 41 U/L 13(L) 14(L) -  ALT 0 - 44 U/L 11 13 -     Radiology Studies: DG Chest 2 View  Result Date: 03/31/2021 CLINICAL DATA:  Fever. EXAM: CHEST - 2 VIEW COMPARISON:  12/31/2020. FINDINGS: The heart size and mediastinal contours are within  normal limits. Both lungs are clear. Degenerative changes are present in the thoracic spine. IMPRESSION: No active cardiopulmonary disease. Electronically Signed   By: Thornell SartoriusLaura  Taylor M.D.   On: 03/31/2021 23:34   CT RENAL STONE STUDY  Result Date: 04/01/2021 CLINICAL DATA:  Nephrolithiasis. EXAM: CT ABDOMEN AND PELVIS WITHOUT CONTRAST TECHNIQUE: Multidetector CT imaging of the abdomen and pelvis was performed following the standard protocol without IV contrast. RADIATION DOSE REDUCTION: This exam was performed according to the departmental dose-optimization program which includes automated exposure control, adjustment of the mA and/or kV according to patient size and/or use of iterative reconstruction technique. COMPARISON:  CT examination dated May 29, 2019 FINDINGS: Lower chest: No acute abnormality. Hepatobiliary: Stable hypodensity about the dome of the diaphragm and in the right hepatic lobe adjacent to the gallbladder fossa, likely cysts, unchanged. No gallstones, gallbladder wall thickening, or biliary dilatation. Pancreas: Moderate pancreatic atrophy. No pancreatic ductal dilatation or surrounding inflammatory changes. Spleen: Normal in size with punctate calcifications, likely sequela of prior granulomatous infection. Adrenals/Urinary Tract: Adrenal glands are unremarkable. There are multiple calculi in the left kidney the largest measuring up to 1.3 cm. There is moderate left hydronephrosis and left perinephric fat stranding. The left ureter is prominent with mild surrounding fat stranding. No appreciable calculus in the left ureter, however evaluation of distal left ureter is limited due to streak artifact from right hip arthroplasty. There is however a calculus in the lumen of the urinary bladder adjacent to the [facility] catheter balloon measuring approximatelyr 6 mm. There is another large calculus in the pelvis or in the lumen of the urinary bladder, evaluation is limited due to streak artifact. No  appreciable calculus in the right kidney. Right ureter is unremarkable. The urinary bladder is collapsed about the [facility] limiting evaluation. Stomach/Bowel: Stomach is within normal limits. Appendix appears normal. No evidence of bowel wall thickening, distention, or inflammatory changes. Vascular/Lymphatic: Fusiform aneurysmal dilatation of the infrarenal abdominal aorta measuring up to 4.4 cm (series 2, image 39), which have increased in size since recent prior examination measuring 4 cm. Reproductive: Prostate is unremarkable. Other: Fat containing bilateral inguinal hernias. No abdominopelvic ascites. Musculoskeletal: Stable compression deformity of L1 vertebral body as well as prominent Schmorl node in the inferior endplate of the L2 vertebral body. Moderate multilevel degenerative disc disease. Diffuse osteopenia. Status post right hip arthroplasty. IMPRESSION: 1. Left hydronephrosis and hydroureter with left perinephric fat stranding without evidence of ureteral calculus and a small 6 mm calculus in the left urinary bladder lumen, findings likely represent recently passed calculus. 2. Evaluation of the urinary bladder is somewhat limited due to streak artifact from right hip arthroplasty. No right hydronephrosis or ureteral calculus. 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 3. Aneurysmal dilatation of the abdominal aorta now measuring 4.3 cm, which measured  4 cm on prior CT examination of May 29, 2019. Continued short-term follow-up examination is recommended. 4.  Colonic diverticulosis without evidence of acute diverticulitis. 5. Compression deformity of L1 and L2 vertebral bodies, unchanged. Status post right hip arthroplasty with intact hardware. Electronically Signed   By: Larose Hires D.O.   On: 04/01/2021 16:01     Scheduled Meds:  Chlorhexidine Gluconate Cloth  6 each Topical Daily   doxazosin  16 mg Oral Daily   metoprolol succinate  25 mg  Oral Daily   sertraline  150 mg Oral Daily   Continuous Infusions:  ceFEPime (MAXIPIME) IV 2 g (04/02/21 0108)   heparin 1,250 Units/hr (04/02/21 0001)     LOS: 1 day   Time spent: 40  Rhetta Mura, MD [facility] To contact the attending provider between 7A-7P or the covering provider during after hours 7P-7A, please log into the web site [email or web address] and access using universal  password for that web site. If you do not have the password, please call the hospital operator.  04/02/2021, 7:32 AM

## 2021-04-03 DIAGNOSIS — A419 Sepsis, unspecified organism: Secondary | ICD-10-CM | POA: Diagnosis not present

## 2021-04-03 LAB — COMPREHENSIVE METABOLIC PANEL WITH GFR
ALT: 12 U/L (ref 0–44)
AST: 13 U/L — ABNORMAL LOW (ref 15–41)
Albumin: 3.1 g/dL — ABNORMAL LOW (ref 3.5–5.0)
Alkaline Phosphatase: 42 U/L (ref 38–126)
Anion gap: 6 (ref 5–15)
BUN: 15 mg/dL (ref 8–23)
CO2: 27 mmol/L (ref 22–32)
Calcium: 9 mg/dL (ref 8.9–10.3)
Chloride: 108 mmol/L (ref 98–111)
Creatinine, Ser: 1.04 mg/dL (ref 0.61–1.24)
GFR, Estimated: >60 mL/min
Glucose, Bld: 106 mg/dL — ABNORMAL HIGH (ref 70–99)
Potassium: 4 mmol/L (ref 3.5–5.1)
Sodium: 141 mmol/L (ref 135–145)
Total Bilirubin: 1 mg/dL (ref 0.3–1.2)
Total Protein: 6.3 g/dL — ABNORMAL LOW (ref 6.5–8.1)

## 2021-04-03 LAB — CBC WITH DIFFERENTIAL/PLATELET
Abs Immature Granulocytes: 0.04 K/uL (ref 0.00–0.07)
Basophils Absolute: 0 K/uL (ref 0.0–0.1)
Basophils Relative: 0 %
Eosinophils Absolute: 0.3 K/uL (ref 0.0–0.5)
Eosinophils Relative: 3 %
HCT: 40.6 % (ref 39.0–52.0)
Hemoglobin: 13.5 g/dL (ref 13.0–17.0)
Immature Granulocytes: 0 %
Lymphocytes Relative: 22 %
Lymphs Abs: 2.6 K/uL (ref 0.7–4.0)
MCH: 32 pg (ref 26.0–34.0)
MCHC: 33.3 g/dL (ref 30.0–36.0)
MCV: 96.2 fL (ref 80.0–100.0)
Monocytes Absolute: 1 K/uL (ref 0.1–1.0)
Monocytes Relative: 9 %
Neutro Abs: 7.6 K/uL (ref 1.7–7.7)
Neutrophils Relative %: 66 %
Platelets: 109 K/uL — ABNORMAL LOW (ref 150–400)
RBC: 4.22 MIL/uL (ref 4.22–5.81)
RDW: 13.4 % (ref 11.5–15.5)
WBC: 11.6 K/uL — ABNORMAL HIGH (ref 4.0–10.5)
nRBC: 0 % (ref 0.0–0.2)

## 2021-04-03 LAB — URINE CULTURE: Culture: 80000 — AB

## 2021-04-03 LAB — GLUCOSE, CAPILLARY: Glucose-Capillary: 100 mg/dL — ABNORMAL HIGH (ref 70–99)

## 2021-04-03 MED ORDER — LEVOFLOXACIN 750 MG PO TABS: 750.0000 mg | ORAL_TABLET | Freq: Every day | ORAL | 0 refills | Status: AC

## 2021-04-03 MED ORDER — LEVOFLOXACIN 500 MG PO TABS: 750.0000 mg | ORAL_TABLET | Freq: Every day | ORAL | Status: DC

## 2021-04-03 MED ORDER — CIPROFLOXACIN HCL 500 MG PO TABS
500.0000 mg | ORAL_TABLET | Freq: Two times a day (BID) | ORAL | Status: DC
  Administered 2021-04-03: 500 mg via ORAL
  Filled 2021-04-03: qty 1

## 2021-04-03 MED ORDER — HYDROMORPHONE HCL 2 MG PO TABS: 4.0000 mg | ORAL_TABLET | Freq: Four times a day (QID) | ORAL | Status: DC | PRN

## 2021-04-03 MED ORDER — PROCHLORPERAZINE EDISYLATE 10 MG/2ML IJ SOLN
10.0000 mg | Freq: Once | INTRAMUSCULAR | Status: AC
  Administered 2021-04-03: 10 mg via INTRAVENOUS
  Filled 2021-04-03: qty 2

## 2021-04-03 MED ORDER — DOXAZOSIN MESYLATE 8 MG PO TABS: 8.0000 mg | ORAL_TABLET | Freq: Every day | ORAL | Status: DC

## 2021-04-03 NOTE — TOC Transition Note (Signed)
Transition of Care [facility]) - CM/SW Discharge Note   Patient Details  Name: Carlos Short. MRN: [id number] Date of Birth: 12-Sep-1950  [name]) CM/SW Contact:  [name], LCSW Phone Number: 04/03/2021, 3:10 PM   Clinical Narrative:      Pt for dc home today with wife. Orders placed for [facility]- pt and wife agreeable to [facility] for [facility].  No further needs-    Final next level of care: Home w Home Health Services Barriers to Discharge: No Barriers Identified   Patient Goals and CMS Choice     Choice offered to / list presented to : Patient, Spouse  Discharge Placement                       Discharge Plan and Services                  DME Agency: [facility]       HH Arranged: RN [facility] Agency: [facility] Date [facility] Agency Contacted: 04/03/21 Time HH Agency Contacted: 1510 Representative spoke with at [facility] Agency: Cindie Sillmon  Social Determinants of Health (SDOH) Interventions     Readmission Risk Interventions No flowsheet data found.   [name], MSW, LCSW Clinical Social Worker

## 2021-04-03 NOTE — Discharge Summary (Signed)
Physician Discharge Summary  Carlos FitzFranklin D Bi Jr. ZOX:[id number] DOB: 07-Jul-1950 DOA: 03/31/2021  PCP: Barbie BannerWilson, Fred H, MD  Admit date: 03/31/2021 Discharge date: 04/03/2021  Time spent: 33 minutes  Recommendations for Outpatient Follow-up:  Home health ordered to ensure taking right medications and for med management Please ensure he keeps his appointment with urologist for bladder stone removal Needs screening ultrasound/CT of AAA and follow-up with vascular surgery scheduled by PCP Complete Levaquin 2 more doses for staph epidermidis complicated UTI Ensure that he is seen in about 1 week at PCP office if possible He has not started his Multaq-this is a reasonable rhythm control choice-given that he has some confusions about his medication this should be also discussed with the cardiologist to ensure that this is a good appropriate choice  Discharge Diagnoses:  MAIN problem for hospitalization   Staff epidermidis UTI Metabolic encephalopathy  Please see below for itemized issues addressed in HOpsital- refer to other progress notes for clarity if needed  Discharge Condition: Improved  Diet recommendation: Heart healthy  Filed Weights   03/31/21 2236 04/01/21 1745  Weight: 81.6 kg 88.5 kg    History of present illness:  71 year old white male known history HFpEF, paroxysmal A. fib, chronic thrombocytopenia, history of AAA, sciatica low back pain   Came in with sepsis likely secondary to UTI-T-max 99 and tachycardic Foley catheter placed  Hospital Course:  Serratia + Staph Epidermidis [in urine? Contam] pyelonephritis likely 2/2 recently passed nephrolithiasis per CT Non obstructive hydronephrosis Discharging on 2 more days of Levaquin (discussed with pharmacist) WBC improved, Creat stable Blood culture negative x2 Urology has him scheduled for an appointment sometime in the near future I cut back his dose of Cardura from 16 to 8 mg Nausea/Vomit Likely 2/2  above--scheduled Zofran 4 q 8 and this was discontinued subsequently Chr TCP Count slightly lower likely 2/2 infection-requires outpatient follow-up labs HfpEF Controlled atrial fibrillation transfer >4 on anticoagulation Mild QTC prolongation less than 450 Heparin started on admission-discontinue as no role for procedures at this time--place back on prior AC-Eliquis 5 twice daily Continue Toprol-XL 25 daily on discharge Prior AAA--slightly larger this admit Needs OP US ~ 3-6 mo   Discharge Exam: Vitals:   04/03/21 0930 04/03/21 1341  BP:  120/85  Pulse:  (!) 46  Resp:    Temp: 98.1 F (36.7 C) 97.8 F (36.6 C)  SpO2:  97%    Subj on day of d/c    awake coherent no distress EOMI NCAT no focal deficit  General Exam on discharge  EOMI NCAT no focal deficit S1-S2 no murmur no rub no gallop chest clear  Neurologically intact ROM intact Abdomen is soft nontender Psych is euthymic  Discharge Instructions   Discharge Instructions     Diet - low sodium heart healthy   Complete by: As directed    Discharge instructions   Complete by: As directed    Complete the Levaquin If you are not planning to start the medication then talk to your cardiologist about your new medication Multaq--it is a good medication to control the heart rate rhythm that requires monitoring and you will need labs in about 1 week Make sure that you follow-up in the outpatient setting We will have home health come out to ensure that we can plan for your discharge with home health nursing and medication management as well   Increase activity slowly   Complete by: As directed       Allergies as of 04/03/2021  Reactions   Gabapentin    Other reaction(s): Other (See Comments) crazy        Medication List     STOP taking these medications    ciprofloxacin 500 MG tablet Commonly known as: CIPRO   HYDROmorphone 4 MG tablet Commonly known as: DILAUDID   sertraline 100 MG  tablet Commonly known as: ZOLOFT       TAKE these medications    apixaban 5 MG Tabs tablet Commonly known as: ELIQUIS Take 1 tablet (5 mg total) by mouth 2 (two) times daily.   doxazosin 8 MG tablet Commonly known as: CARDURA Take 1 tablet (8 mg total) by mouth daily. Start taking on: April 04, 2021 What changed: how much to take   dronedarone 400 MG tablet Commonly known as: MULTAQ Take 1 tablet (400 mg total) by mouth 2 (two) times daily with a meal.   fluticasone 50 MCG/ACT nasal spray Commonly known as: FLONASE Place 1 spray into both nostrils daily as needed for allergies.   ibandronate 150 MG tablet Commonly known as: BONIVA Take 1 tablet by mouth every 30 (thirty) days. 3rd of each month   levofloxacin 750 MG tablet Commonly known as: LEVAQUIN Take 1 tablet (750 mg total) by mouth daily at 8 pm for 2 days.   metoprolol succinate 25 MG 24 hr tablet Commonly known as: TOPROL-XL Take 1 tablet (25 mg total) by mouth daily. Take with or immediately following a meal.   rOPINIRole 1 MG tablet Commonly known as: REQUIP Take 1 mg by mouth 2 (two) times daily as needed (restless legs).   traMADol 50 MG tablet Commonly known as: ULTRAM Take 50 mg by mouth daily as needed for moderate pain.       Allergies  Allergen Reactions   Gabapentin     Other reaction(s): Other (See Comments) crazy      The results of significant diagnostics from this hospitalization (including imaging, microbiology, ancillary and laboratory) are listed below for reference.    Significant Diagnostic Studies: DG Chest 2 View  Result Date: 03/31/2021 CLINICAL DATA:  Fever. EXAM: CHEST - 2 VIEW COMPARISON:  12/31/2020. FINDINGS: The heart size and mediastinal contours are within normal limits. Both lungs are clear. Degenerative changes are present in the thoracic spine. IMPRESSION: No active cardiopulmonary disease. Electronically Signed   By: Thornell Sartorius M.D.   On: 03/31/2021 23:34    CT RENAL STONE STUDY  Result Date: 04/01/2021 CLINICAL DATA:  Nephrolithiasis. EXAM: CT ABDOMEN AND PELVIS WITHOUT CONTRAST TECHNIQUE: Multidetector CT imaging of the abdomen and pelvis was performed following the standard protocol without IV contrast. RADIATION DOSE REDUCTION: This exam was performed according to the departmental dose-optimization program which includes automated exposure control, adjustment of the mA and/or kV according to patient size and/or use of iterative reconstruction technique. COMPARISON:  CT examination dated May 29, 2019 FINDINGS: Lower chest: No acute abnormality. Hepatobiliary: Stable hypodensity about the dome of the diaphragm and in the right hepatic lobe adjacent to the gallbladder fossa, likely cysts, unchanged. No gallstones, gallbladder wall thickening, or biliary dilatation. Pancreas: Moderate pancreatic atrophy. No pancreatic ductal dilatation or surrounding inflammatory changes. Spleen: Normal in size with punctate calcifications, likely sequela of prior granulomatous infection. Adrenals/Urinary Tract: Adrenal glands are unremarkable. There are multiple calculi in the left kidney the largest measuring up to 1.3 cm. There is moderate left hydronephrosis and left perinephric fat stranding. The left ureter is prominent with mild surrounding fat stranding. No appreciable calculus in the left  ureter, however evaluation of distal left ureter is limited due to streak artifact from right hip arthroplasty. There is however a calculus in the lumen of the urinary bladder adjacent to the [facility] catheter balloon measuring approximatelyr 6 mm. There is another large calculus in the pelvis or in the lumen of the urinary bladder, evaluation is limited due to streak artifact. No appreciable calculus in the right kidney. Right ureter is unremarkable. The urinary bladder is collapsed about the [facility] catheter limiting evaluation. Stomach/Bowel: Stomach is within normal limits. Appendix  appears normal. No evidence of bowel wall thickening, distention, or inflammatory changes. Vascular/Lymphatic: Fusiform aneurysmal dilatation of the infrarenal abdominal aorta measuring up to 4.4 cm (series 2, image 39), which have increased in size since recent prior examination measuring 4 cm. Reproductive: Prostate is unremarkable. Other: Fat containing bilateral inguinal hernias. No abdominopelvic ascites. Musculoskeletal: Stable compression deformity of L1 vertebral body as well as prominent Schmorl node in the inferior endplate of the L2 vertebral body. Moderate multilevel degenerative disc disease. Diffuse osteopenia. Status post right hip arthroplasty. IMPRESSION: 1. Left hydronephrosis and hydroureter with left perinephric fat stranding without evidence of ureteral calculus and a small 6 mm calculus in the left urinary bladder lumen, findings likely represent recently passed calculus. 2. Evaluation of the urinary bladder is somewhat limited due to streak artifact from right hip arthroplasty. No right hydronephrosis or ureteral calculus. 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 3. 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 Continued short-term follow-up examination is recommended. 4.  Colonic diverticulosis without evidence of acute diverticulitis. 5. Compression deformity of L1 and L2 vertebral bodies, unchanged. Status post right hip arthroplasty with intact hardware. Electronically Signed   By: Larose Hires D.O.   On: 04/01/2021 16:01    Microbiology: Recent Results (from the past 240 hour(s))  Urine Culture     Status: Abnormal   Collection Time: 03/31/21 11:44 PM   Specimen: Urine, Clean Catch  Result Value Ref Range Status   Specimen Description   Final    URINE, CLEAN CATCH Performed at [facility], [address] [place], [place] [address]     Special Requests   Final    NONE Performed at [facility], [address], [place], [place] [address]    Culture (A)  Final    80,000 COLONIES/mL SERRATIA MARCESCENS >=100,000 COLONIES/mL STAPHYLOCOCCUS EPIDERMIDIS    Report Status 04/03/2021 FINAL  Final   Organism ID, Bacteria SERRATIA MARCESCENS (A)  Final   Organism ID, Bacteria STAPHYLOCOCCUS EPIDERMIDIS (A)  Final      Susceptibility   Staphylococcus epidermidis - MIC*    CIPROFLOXACIN 4 RESISTANT Resistant     GENTAMICIN 8 INTERMEDIATE Intermediate     NITROFURANTOIN <=16 SENSITIVE Sensitive     OXACILLIN <=0.25 SENSITIVE Sensitive     TETRACYCLINE >=16 RESISTANT Resistant     VANCOMYCIN 1 SENSITIVE Sensitive     TRIMETH/SULFA 80 RESISTANT Resistant     CLINDAMYCIN <=0.25 SENSITIVE Sensitive     RIFAMPIN <=0.5 SENSITIVE Sensitive     Inducible Clindamycin NEGATIVE Sensitive     * >=100,000 COLONIES/mL STAPHYLOCOCCUS EPIDERMIDIS   Serratia marcescens - MIC*    CEFAZOLIN >=64 RESISTANT Resistant     CEFEPIME <=0.12 SENSITIVE Sensitive     CEFTRIAXONE <=0.25 SENSITIVE Sensitive     CIPROFLOXACIN <=0.25 SENSITIVE Sensitive  GENTAMICIN <=1 SENSITIVE Sensitive     NITROFURANTOIN 128 RESISTANT Resistant     TRIMETH/SULFA <=20 SENSITIVE Sensitive     * 80,000 COLONIES/mL SERRATIA MARCESCENS  Blood Culture (routine x 2)     Status: None (Preliminary result)   Collection Time: 03/31/21 11:45 PM   Specimen: BLOOD RIGHT FOREARM  Result Value Ref Range Status   Specimen Description   Final    BLOOD RIGHT FOREARM Performed at [facility], [address] [place], [place] [address]    Special Requests   Final    BOTTLES DRAWN AEROBIC AND ANAEROBIC Blood Culture adequate volume Performed at [facility], [address] [place], [place] [address]    Culture   Final    NO GROWTH 2 DAYS Performed at [facility], [address], [place], [place] [address]     Report Status PENDING  Incomplete  Blood Culture (routine x 2)     Status: None (Preliminary result)   Collection Time: 04/01/21 12:15 AM   Specimen: BLOOD LEFT ARM  Result Value Ref Range Status   Specimen Description   Final    BLOOD LEFT ARM Performed at [facility], [address], [place], [place] [address]    Special Requests   Final    BOTTLES DRAWN AEROBIC AND ANAEROBIC Blood Culture adequate volume Performed at [facility], [address] [place], [place] [address]    Culture   Final    NO GROWTH 2 DAYS Performed at [facility], [address] [place], [place] [address]    Report Status PENDING  Incomplete  Resp Panel by RT-PCR (Flu A&B, Covid) Peripheral     Status: None   Collection Time: 04/01/21 11:48 AM   Specimen: Peripheral; Nasopharyngeal(NP) swabs in vial transport medium  Result Value Ref Range Status   SARS Coronavirus 2 by RT PCR NEGATIVE NEGATIVE Final    Comment: (NOTE) SARS-CoV-2 target nucleic acids are NOT DETECTED.  The SARS-CoV-2 RNA is generally detectable in upper respiratory specimens during the acute phase of infection. The lowest concentration of SARS-CoV-2 viral copies this assay can detect is 138 copies/mL. A negative result does not preclude SARS-Cov-2 infection and should not be used as the sole basis for treatment or other patient management decisions. A negative result may occur with  improper specimen collection/handling, submission of specimen other than nasopharyngeal swab, presence of viral mutation(s) within the areas targeted by this assay, and inadequate number of viral copies(<138 copies/mL). A negative result must be combined with clinical observations, patient history, and epidemiological information. The expected result is Negative.  Fact Sheet for Patients:  BloggerCourse.com  Fact Sheet for Healthcare Providers:   SeriousBroker.it  This test is no t yet approved or cleared by the [place] FDA and  has been authorized for detection and/or diagnosis of SARS-CoV-2 by FDA under an Emergency Use Authorization (EUA). This EUA will remain  in effect (meaning this test can be used) for the duration of the COVID-19 declaration under Section 564(b)(1) of the Act, 21 U.S.C.section 360bbb-3(b)(1), unless the authorization is terminated  or revoked sooner.       Influenza A by PCR NEGATIVE NEGATIVE Final   Influenza B by PCR NEGATIVE NEGATIVE Final    Comment: (NOTE) The Xpert Xpress SARS-CoV-2/FLU/RSV plus assay is intended as an aid in the diagnosis of influenza from Nasopharyngeal swab specimens and should not be used as a sole basis for treatment.  Nasal washings and aspirates are unacceptable for Xpert Xpress SARS-CoV-2/FLU/RSV testing.  Fact Sheet for Patients: BloggerCourse.comhttps://www.fda.gov/media/152166/download  Fact Sheet for Healthcare Providers: SeriousBroker.ithttps://www.fda.gov/media/152162/download  This test is not yet approved or cleared by the [place] FDA and has been authorized for detection and/or diagnosis of SARS-CoV-2 by FDA under an Emergency Use Authorization (EUA). This EUA will remain in effect (meaning this test can be used) for the duration of the COVID-19 declaration under Section 564(b)(1) of the Act, 21 U.S.C. section 360bbb-3(b)(1), unless the authorization is terminated or revoked.  Performed at [facility], [address] [place], [place] [id number]      Labs: Basic Metabolic Panel: Recent Labs  Lab 04/01/21 0120 04/01/21 0518 04/02/21 0808 04/03/21 0411  NA 135 138 137 141  K 3.8 3.7 3.9 4.0  CL 106 107 106 108  CO2 20* 23 24 27   GLUCOSE 104* 112* 108* 106*  BUN 20 19 14 15   CREATININE 1.14 1.18 0.99 1.04  CALCIUM 8.2* 8.1* 8.3* 9.0   Liver Function Tests: Recent Labs  Lab 04/01/21 0120 04/01/21 0518  04/02/21 0808 04/03/21 0411  AST 14* 13* 15 13*  ALT 13 11 13 12   ALKPHOS 46 42 42 42  BILITOT 0.8 0.7 1.5* 1.0  PROT 6.4* 5.8* 6.1* 6.3*  ALBUMIN 3.5 3.0* 3.1* 3.1*   No results for input(s): LIPASE, AMYLASE in the last 168 hours. No results for input(s): AMMONIA in the last 168 hours. CBC: Recent Labs  Lab 04/01/21 0120 04/01/21 0518 04/02/21 0808 04/03/21 0411  WBC 16.0* 14.5* 16.7* 11.6*  NEUTROABS 12.6* 10.9* 12.8* 7.6  HGB 14.8 13.6 13.1 13.5  HCT 42.9 39.5 37.3* 40.6  MCV 92.5 94.3 93.0 96.2  PLT 130* 117* 101* 109*   Cardiac Enzymes: No results for input(s): CKTOTAL, CKMB, CKMBINDEX, TROPONINI in the last 168 hours. BNP: BNP (last 3 results) No results for input(s): BNP in the last 8760 hours.  ProBNP (last 3 results) No results for input(s): PROBNP in the last 8760 hours.  CBG: Recent Labs  Lab 04/03/21 0548  GLUCAP 100*       Signed:  Rhetta MuraJai-Gurmukh Kaiyu Mirabal MD   [facility] 04/03/2021, 1:54 PM

## 2021-04-03 NOTE — Progress Notes (Addendum)
Pt bed linen and gown changed x2 this shift from being soaked with sweat and c/o being very cold. Pt noted to be very diaphoretic. CBG checked, 100. Vital signs stable and no temp noted. Pt denies any pain. Urine output is good and no tenderness or distention noted when palpating bladder.   RN asked pt about alcohol and drug history d/t signs of withdrawal: sweats, tremors, and nausea. Answers were unclear. Pt stated he has not had any alcohol since 1990s, but did a lot of drugs. Pt stated it has been a while since he did drugs. Pt stated alcohol was his substance of choice, but he did every kind of drug you could think of. He verbalized he still smoke marijuana and the dosage of his narcotics has been changing. He never gave RN a clear answer to if he did any illicit drugs recently.  PO dilaudid and tramadol noted on home med list.   Pt has a hx of AAA and heparin d/c'd yesterday, but no signs of active bleeding noted. No bruising, tachycardia or hypotension.   New medication, Compazine x1, ordered last night for ongoing nausea. At this time, vital signs are stable and pt oriented x4.

## 2021-04-04 LAB — HEMOGLOBIN A1C
Hgb A1c MFr Bld: 5.4 % (ref 4.8–5.6)
Mean Plasma Glucose: 108 mg/dL

## 2021-04-05 ENCOUNTER — Other Ambulatory Visit: Payer: Self-pay | Admitting: Urology

## 2021-04-05 ENCOUNTER — Telehealth: Payer: Self-pay | Admitting: Student

## 2021-04-05 NOTE — Telephone Encounter (Signed)
Patient is completing course of Levaquin for UTI, will stop Multaq

## 2021-04-06 LAB — CULTURE, BLOOD (ROUTINE X 2)
Culture: NO GROWTH
Culture: NO GROWTH
Special Requests: ADEQUATE
Special Requests: ADEQUATE

## 2021-04-07 ENCOUNTER — Ambulatory Visit: Payer: Medicare Other

## 2021-04-08 ENCOUNTER — Encounter: Payer: Self-pay | Admitting: Student

## 2021-04-08 NOTE — Patient Instructions (Addendum)
DUE TO COVID-19 ONLY ONE VISITOR IS ALLOWED TO COME WITH YOU AND STAY IN THE WAITING ROOM ONLY DURING PRE OP AND PROCEDURE DAY OF SURGERY IF YOU ARE GOING HOME AFTER SURGERY. IF YOU ARE SPENDING THE NIGHT 2 PEOPLE MAY VISIT WITH YOU IN YOUR PRIVATE ROOM AFTER SURGERY UNTIL VISITING  HOURS ARE OVER AT 800 PM AND 1  VISITOR  MAY  SPEND THE NIGHT.                Carlos Short.     Your procedure is scheduled on: 04/19/21   Report to [facility] Main  Entrance   Report to admitting at  11:15 AM     Call this number if you have problems the morning of surgery [phone]    Remember: Do not eat food  :After Midnight the night before your surgery,   You may have clear liquids from midnight until 7:30 AM   CLEAR LIQUID DIET   Foods Allowed                                                                     Foods Excluded  Coffee and tea, regular and decaf                             liquids that you cannot  Plain Jell-O any favor except red or purple                                           see through such as: Fruit ices (not with fruit pulp)                                     milk, soups, orange juice  Iced Popsicles                                    All solid food Carbonated beverages, regular and diet                                    Cranberry, grape and apple juices Sports drinks like Gatorade Lightly seasoned clear broth or consume(fat free) Sugar    BRUSH YOUR TEETH MORNING OF SURGERY AND RINSE YOUR MOUTH OUT, NO CHEWING GUM CANDY OR MINTS.    Take these medicines the morning of surgery with A SIP OF WATER: Donedarone, Ropinirone, Metoprolol  Stop taking __Eliquis_________on _2/4_________as instructed by __cardiologist___________.                                   You may not have any metal on your body including              piercings  Do not wear jewelry, lotions, powders or deodorant  Men may shave face and neck.   Do not bring  valuables to the hospital. [facility] IS NOT             RESPONSIBLE   FOR VALUABLES.  Contacts, dentures or bridgework may not be worn into surgery.     Patients discharged the day of surgery will not be allowed to drive home.  IF YOU ARE HAVING SURGERY AND GOING HOME THE SAME DAY, YOU MUST HAVE AN ADULT TO DRIVE YOU HOME AND BE WITH YOU FOR 24 HOURS. YOU MAY GO HOME BY TAXI OR UBER OR ORTHERWISE, BUT AN ADULT MUST ACCOMPANY YOU HOME AND STAY WITH YOU FOR 24 HOURS.  Name and phone number of your driver:  Special Instructions: N/A              Please read over the following fact sheets you were given: _____________________________________________________________________             [facility] - Preparing for Surgery Before surgery, you can play an important role.  Because skin is not sterile, your skin needs to be as free of germs as possible.  You can reduce the number of germs on your skin by washing with CHG (chlorahexidine gluconate) soap before surgery.  CHG is an antiseptic cleaner which kills germs and bonds with the skin to continue killing germs even after washing. Please DO NOT use if you have an allergy to CHG or antibacterial soaps.  If your skin becomes reddened/irritated stop using the CHG and inform your nurse when you arrive at Short Stay.  You may shave your face/neck. Please follow these instructions carefully:  1.  Shower with CHG Soap the night before surgery and the  morning of Surgery.  2.  If you choose to wash your hair, wash your hair first as usual with your  normal  shampoo.  3.  After you shampoo, rinse your hair and body thoroughly to remove the  shampoo.                            4.  Use CHG as you would any other liquid soap.  You can apply chg directly  to the skin and wash                       Gently with a scrungie or clean washcloth.  5.  Apply the CHG Soap to your body ONLY FROM THE NECK DOWN.   Do not use on face/ open                           Wound  or open sores. Avoid contact with eyes, ears mouth and genitals (private parts).                       Wash face,  Genitals (private parts) with your normal soap.             6.  Wash thoroughly, paying special attention to the area where your surgery  will be performed.  7.  Thoroughly rinse your body with warm water from the neck down.  8.  DO NOT shower/wash with your normal soap after using and rinsing off  the CHG Soap.                9.  Pat yourself dry with a clean towel.  10.  Wear clean pajamas.            11.  Place clean sheets on your bed the night of your first shower and do not  sleep with pets. Day of Surgery : Do not apply any lotions/deodorants the morning of surgery.  Please wear clean clothes to the hospital/surgery center.  FAILURE TO FOLLOW THESE INSTRUCTIONS MAY RESULT IN THE CANCELLATION OF YOUR SURGERY PATIENT SIGNATURE_________________________________  NURSE SIGNATURE__________________________________  ________________________________________________________________________

## 2021-04-08 NOTE — Telephone Encounter (Signed)
Is she going to call back to reschedule?

## 2021-04-11 ENCOUNTER — Encounter (HOSPITAL_COMMUNITY)
Admission: RE | Admit: 2021-04-11 | Discharge: 2021-04-11 | Disposition: A | Discharge status: Home / Self Care | Payer: Medicare Other | Source: Ambulatory Visit | Attending: Urology | Admitting: Urology

## 2021-04-11 ENCOUNTER — Other Ambulatory Visit: Payer: Self-pay

## 2021-04-11 ENCOUNTER — Ambulatory Visit: Payer: Medicare Other

## 2021-04-11 ENCOUNTER — Encounter (HOSPITAL_COMMUNITY): Payer: Self-pay

## 2021-04-11 DIAGNOSIS — Z01812 Encounter for preprocedural laboratory examination: Secondary | ICD-10-CM | POA: Insufficient documentation

## 2021-04-11 HISTORY — DX: Other amnesia: R41.3

## 2021-04-11 HISTORY — DX: Unspecified mood (affective) disorder: F39

## 2021-04-11 HISTORY — DX: Personal history of urinary calculi: Z87.442

## 2021-04-11 HISTORY — DX: Essential (primary) hypertension: I10

## 2021-04-11 HISTORY — DX: Restless legs syndrome: G25.81

## 2021-04-11 MED ORDER — CEFAZOLIN SODIUM-DEXTROSE 2-4 GM/100ML-% IV SOLN
2.0000 g | INTRAVENOUS | Status: DC
  Filled 2021-04-11: qty 100

## 2021-04-11 NOTE — Progress Notes (Signed)
COVID test. NA   PCP - Dr. Tillman Sers Cardiologist - [facility]  Chest x-ray - 03/31/21-epic EKG - 04/03/21-epic Stress Test - 02/01/22-epic ECHO - 02/01/22-epic Cardiac Cath - no Pacemaker/ICD device last checked:NA  Sleep Study - no CPAP -   Fasting Blood Sugar - NA Checks Blood Sugar _____ times a day  Blood Thinner Instructions:Eliquis/ Celest Cantwell Aspirin Instructions:Stop 2 days prior to DOS/ [employer] Last Dose:2/4  Anesthesia review: yes  Patient denies shortness of breath, fever, cough and chest pain at PAT appointment Pt has difficulty with memory and had his wife with him at the PAT visit.  Patient verbalized understanding of instructions that were given to them at the PAT appointment. Patient was also instructed that they will need to review over the PAT instructions again at home before surgery. Yes.  Pt has been self catheterizing  for a few weeks. We provided an in and out cath kit to obtain an UA and urine culture. Pt started to bleed from the penis .The cath was removed and bleeding continued for 3-4 minutes. Pt was very upset ,having difficulty processing the event and became tearful and angry. The Anesthesia PA Janett Billow was called to the bathroom to assess and  she called [facility]. Pt was wheeled to [facility] with his wife . He started to calm down and no more bleeding noted. Report was given to [facility] LPN , the on call nurse for [facility]. Pt was left to their care around 10:30.

## 2021-04-18 ENCOUNTER — Encounter (HOSPITAL_COMMUNITY): Payer: Self-pay

## 2021-04-18 ENCOUNTER — Emergency Department (HOSPITAL_COMMUNITY): Payer: Medicare Other

## 2021-04-18 ENCOUNTER — Other Ambulatory Visit: Payer: Self-pay

## 2021-04-18 ENCOUNTER — Inpatient Hospital Stay (HOSPITAL_COMMUNITY)
Admission: EM | Admit: 2021-04-18 | Discharge: 2021-04-21 | DRG: 689 | Disposition: A | Discharge status: Home / Self Care | Payer: Medicare Other | Attending: Internal Medicine | Admitting: Internal Medicine

## 2021-04-18 DIAGNOSIS — N3001 Acute cystitis with hematuria: Secondary | ICD-10-CM

## 2021-04-18 DIAGNOSIS — N136 Pyonephrosis: Secondary | ICD-10-CM | POA: Diagnosis not present

## 2021-04-18 DIAGNOSIS — E785 Hyperlipidemia, unspecified: Secondary | ICD-10-CM | POA: Diagnosis present

## 2021-04-18 DIAGNOSIS — Z96641 Presence of right artificial hip joint: Secondary | ICD-10-CM | POA: Diagnosis present

## 2021-04-18 DIAGNOSIS — Z79899 Other long term (current) drug therapy: Secondary | ICD-10-CM

## 2021-04-18 DIAGNOSIS — Z832 Family history of diseases of the blood and blood-forming organs and certain disorders involving the immune mechanism: Secondary | ICD-10-CM

## 2021-04-18 DIAGNOSIS — G2581 Restless legs syndrome: Secondary | ICD-10-CM | POA: Diagnosis present

## 2021-04-18 DIAGNOSIS — Z818 Family history of other mental and behavioral disorders: Secondary | ICD-10-CM

## 2021-04-18 DIAGNOSIS — Z8051 Family history of malignant neoplasm of kidney: Secondary | ICD-10-CM

## 2021-04-18 DIAGNOSIS — G9341 Metabolic encephalopathy: Secondary | ICD-10-CM | POA: Diagnosis present

## 2021-04-18 DIAGNOSIS — F17210 Nicotine dependence, cigarettes, uncomplicated: Secondary | ICD-10-CM | POA: Diagnosis present

## 2021-04-18 DIAGNOSIS — Z7901 Long term (current) use of anticoagulants: Secondary | ICD-10-CM

## 2021-04-18 DIAGNOSIS — I714 Abdominal aortic aneurysm, without rupture, unspecified: Secondary | ICD-10-CM | POA: Diagnosis present

## 2021-04-18 DIAGNOSIS — N401 Enlarged prostate with lower urinary tract symptoms: Secondary | ICD-10-CM | POA: Diagnosis present

## 2021-04-18 DIAGNOSIS — N210 Calculus in bladder: Secondary | ICD-10-CM

## 2021-04-18 DIAGNOSIS — I10 Essential (primary) hypertension: Secondary | ICD-10-CM

## 2021-04-18 DIAGNOSIS — Z888 Allergy status to other drugs, medicaments and biological substances status: Secondary | ICD-10-CM

## 2021-04-18 DIAGNOSIS — N390 Urinary tract infection, site not specified: Secondary | ICD-10-CM | POA: Diagnosis present

## 2021-04-18 DIAGNOSIS — N200 Calculus of kidney: Secondary | ICD-10-CM

## 2021-04-18 DIAGNOSIS — I480 Paroxysmal atrial fibrillation: Secondary | ICD-10-CM | POA: Diagnosis present

## 2021-04-18 DIAGNOSIS — I5022 Chronic systolic (congestive) heart failure: Secondary | ICD-10-CM | POA: Diagnosis present

## 2021-04-18 DIAGNOSIS — I110 Hypertensive heart disease with heart failure: Secondary | ICD-10-CM | POA: Diagnosis present

## 2021-04-18 DIAGNOSIS — R339 Retention of urine, unspecified: Secondary | ICD-10-CM

## 2021-04-18 DIAGNOSIS — Z8249 Family history of ischemic heart disease and other diseases of the circulatory system: Secondary | ICD-10-CM

## 2021-04-18 DIAGNOSIS — N179 Acute kidney failure, unspecified: Secondary | ICD-10-CM | POA: Diagnosis present

## 2021-04-18 DIAGNOSIS — I7143 Infrarenal abdominal aortic aneurysm, without rupture: Secondary | ICD-10-CM | POA: Diagnosis present

## 2021-04-18 DIAGNOSIS — Z20822 Contact with and (suspected) exposure to covid-19: Secondary | ICD-10-CM | POA: Diagnosis present

## 2021-04-18 DIAGNOSIS — I5020 Unspecified systolic (congestive) heart failure: Secondary | ICD-10-CM

## 2021-04-18 NOTE — ED Triage Notes (Signed)
Pt reports with a fever from home. Wife states that he may be septic due to a stone that he is to have surgery on tomorrow. Temp upon triage 98.4 F orally. Wife states that pt has been more confused. Upon triage pt has a low hr 30-46 bpm.

## 2021-04-19 ENCOUNTER — Emergency Department (HOSPITAL_COMMUNITY): Payer: Medicare Other

## 2021-04-19 ENCOUNTER — Ambulatory Visit (HOSPITAL_COMMUNITY): Admission: RE | Admit: 2021-04-19 | Payer: Medicare Other | Source: Home / Self Care | Admitting: Urology

## 2021-04-19 DIAGNOSIS — N200 Calculus of kidney: Secondary | ICD-10-CM

## 2021-04-19 DIAGNOSIS — N179 Acute kidney failure, unspecified: Secondary | ICD-10-CM | POA: Diagnosis present

## 2021-04-19 DIAGNOSIS — N136 Pyonephrosis: Secondary | ICD-10-CM | POA: Diagnosis present

## 2021-04-19 DIAGNOSIS — N210 Calculus in bladder: Secondary | ICD-10-CM

## 2021-04-19 DIAGNOSIS — N401 Enlarged prostate with lower urinary tract symptoms: Secondary | ICD-10-CM | POA: Diagnosis present

## 2021-04-19 DIAGNOSIS — R339 Retention of urine, unspecified: Secondary | ICD-10-CM

## 2021-04-19 DIAGNOSIS — Z79899 Other long term (current) drug therapy: Secondary | ICD-10-CM | POA: Diagnosis not present

## 2021-04-19 DIAGNOSIS — G2581 Restless legs syndrome: Secondary | ICD-10-CM | POA: Diagnosis present

## 2021-04-19 DIAGNOSIS — N390 Urinary tract infection, site not specified: Secondary | ICD-10-CM | POA: Diagnosis present

## 2021-04-19 DIAGNOSIS — Z818 Family history of other mental and behavioral disorders: Secondary | ICD-10-CM | POA: Diagnosis not present

## 2021-04-19 DIAGNOSIS — I5020 Unspecified systolic (congestive) heart failure: Secondary | ICD-10-CM

## 2021-04-19 DIAGNOSIS — Z20822 Contact with and (suspected) exposure to covid-19: Secondary | ICD-10-CM | POA: Diagnosis present

## 2021-04-19 DIAGNOSIS — I10 Essential (primary) hypertension: Secondary | ICD-10-CM

## 2021-04-19 DIAGNOSIS — I480 Paroxysmal atrial fibrillation: Secondary | ICD-10-CM | POA: Diagnosis present

## 2021-04-19 DIAGNOSIS — Z7901 Long term (current) use of anticoagulants: Secondary | ICD-10-CM | POA: Diagnosis not present

## 2021-04-19 DIAGNOSIS — I110 Hypertensive heart disease with heart failure: Secondary | ICD-10-CM | POA: Diagnosis present

## 2021-04-19 DIAGNOSIS — N3000 Acute cystitis without hematuria: Secondary | ICD-10-CM | POA: Diagnosis not present

## 2021-04-19 DIAGNOSIS — Z832 Family history of diseases of the blood and blood-forming organs and certain disorders involving the immune mechanism: Secondary | ICD-10-CM | POA: Diagnosis not present

## 2021-04-19 DIAGNOSIS — G9341 Metabolic encephalopathy: Secondary | ICD-10-CM | POA: Diagnosis present

## 2021-04-19 DIAGNOSIS — I5022 Chronic systolic (congestive) heart failure: Secondary | ICD-10-CM | POA: Diagnosis present

## 2021-04-19 DIAGNOSIS — Z8249 Family history of ischemic heart disease and other diseases of the circulatory system: Secondary | ICD-10-CM | POA: Diagnosis not present

## 2021-04-19 DIAGNOSIS — I7143 Infrarenal abdominal aortic aneurysm, without rupture: Secondary | ICD-10-CM | POA: Diagnosis present

## 2021-04-19 DIAGNOSIS — Z888 Allergy status to other drugs, medicaments and biological substances status: Secondary | ICD-10-CM | POA: Diagnosis not present

## 2021-04-19 DIAGNOSIS — E785 Hyperlipidemia, unspecified: Secondary | ICD-10-CM | POA: Diagnosis present

## 2021-04-19 DIAGNOSIS — N3001 Acute cystitis with hematuria: Secondary | ICD-10-CM | POA: Diagnosis not present

## 2021-04-19 DIAGNOSIS — Z8051 Family history of malignant neoplasm of kidney: Secondary | ICD-10-CM | POA: Diagnosis not present

## 2021-04-19 DIAGNOSIS — F17210 Nicotine dependence, cigarettes, uncomplicated: Secondary | ICD-10-CM | POA: Diagnosis present

## 2021-04-19 DIAGNOSIS — Z96641 Presence of right artificial hip joint: Secondary | ICD-10-CM | POA: Diagnosis present

## 2021-04-19 HISTORY — DX: Urinary tract infection, site not specified: N39.0

## 2021-04-19 HISTORY — DX: Unspecified systolic (congestive) heart failure: I50.20

## 2021-04-19 HISTORY — DX: Calculus in bladder: N21.0

## 2021-04-19 HISTORY — DX: Essential (primary) hypertension: I10

## 2021-04-19 HISTORY — DX: Retention of urine, unspecified: R33.9

## 2021-04-19 HISTORY — DX: Calculus of kidney: N20.0

## 2021-04-19 LAB — COMPREHENSIVE METABOLIC PANEL
ALT: 18 U/L (ref 0–44)
AST: 16 U/L (ref 15–41)
Albumin: 3.8 g/dL (ref 3.5–5.0)
Alkaline Phosphatase: 58 U/L (ref 38–126)
Anion gap: 6 (ref 5–15)
BUN: 24 mg/dL — ABNORMAL HIGH (ref 8–23)
CO2: 24 mmol/L (ref 22–32)
Calcium: 9 mg/dL (ref 8.9–10.3)
Chloride: 107 mmol/L (ref 98–111)
Creatinine, Ser: 1.36 mg/dL — ABNORMAL HIGH (ref 0.61–1.24)
GFR, Estimated: 56 mL/min — ABNORMAL LOW (ref >60)
Glucose, Bld: 95 mg/dL (ref 70–99)
Potassium: 4.3 mmol/L (ref 3.5–5.1)
Sodium: 137 mmol/L (ref 135–145)
Total Bilirubin: 0.5 mg/dL (ref 0.3–1.2)
Total Protein: 7.4 g/dL (ref 6.5–8.1)

## 2021-04-19 LAB — CBC WITH DIFFERENTIAL/PLATELET
Abs Immature Granulocytes: 0.06 10*3/uL (ref 0.00–0.07)
Basophils Absolute: 0 10*3/uL (ref 0.0–0.1)
Basophils Relative: 0 %
Eosinophils Absolute: 0.3 10*3/uL (ref 0.0–0.5)
Eosinophils Relative: 2 %
HCT: 42.2 % (ref 39.0–52.0)
Hemoglobin: 14.3 g/dL (ref 13.0–17.0)
Immature Granulocytes: 0 %
Lymphocytes Relative: 17 %
Lymphs Abs: 2.4 10*3/uL (ref 0.7–4.0)
MCH: 32.1 pg (ref 26.0–34.0)
MCHC: 33.9 g/dL (ref 30.0–36.0)
MCV: 94.8 fL (ref 80.0–100.0)
Monocytes Absolute: 1.3 10*3/uL — ABNORMAL HIGH (ref 0.1–1.0)
Monocytes Relative: 9 %
Neutro Abs: 10.1 10*3/uL — ABNORMAL HIGH (ref 1.7–7.7)
Neutrophils Relative %: 72 %
Platelets: 194 10*3/uL (ref 150–400)
RBC: 4.45 MIL/uL (ref 4.22–5.81)
RDW: 13.8 % (ref 11.5–15.5)
WBC: 14.2 10*3/uL — ABNORMAL HIGH (ref 4.0–10.5)
nRBC: 0 % (ref 0.0–0.2)

## 2021-04-19 LAB — PROTIME-INR
INR: 1 (ref 0.8–1.2)
Prothrombin Time: 12.7 seconds (ref 11.4–15.2)

## 2021-04-19 LAB — URINALYSIS, ROUTINE W REFLEX MICROSCOPIC
Bacteria, UA: NONE SEEN
Bilirubin Urine: NEGATIVE
Glucose, UA: NEGATIVE mg/dL
Ketones, ur: NEGATIVE mg/dL
Nitrite: NEGATIVE
Protein, ur: 100 mg/dL — AB
RBC / HPF: >50 RBC/hpf — ABNORMAL HIGH (ref 0–5)
Specific Gravity, Urine: 1.012 (ref 1.005–1.030)
WBC, UA: >50 WBC/hpf — ABNORMAL HIGH (ref 0–5)
pH: 7 (ref 5.0–8.0)

## 2021-04-19 LAB — RESP PANEL BY RT-PCR (FLU A&B, COVID) ARPGX2
Influenza A by PCR: NEGATIVE
Influenza B by PCR: NEGATIVE
SARS Coronavirus 2 by RT PCR: NEGATIVE

## 2021-04-19 LAB — LACTIC ACID, PLASMA: Lactic Acid, Venous: 1.1 mmol/L (ref 0.5–1.9)

## 2021-04-19 MED ORDER — HYDROMORPHONE HCL 1 MG/ML IJ SOLN
1.0000 mg | INTRAMUSCULAR | Status: DC | PRN
  Administered 2021-04-20 – 2021-04-21 (×2): 1 mg via INTRAVENOUS
  Filled 2021-04-19 (×2): qty 1

## 2021-04-19 MED ORDER — PROCHLORPERAZINE EDISYLATE 10 MG/2ML IJ SOLN: 10.0000 mg | Freq: Four times a day (QID) | INTRAMUSCULAR | Status: DC | PRN

## 2021-04-19 MED ORDER — VANCOMYCIN HCL 1500 MG/300ML IV SOLN
1500.0000 mg | INTRAVENOUS | Status: DC
  Administered 2021-04-20: 1500 mg via INTRAVENOUS
  Filled 2021-04-19: qty 300

## 2021-04-19 MED ORDER — APIXABAN 5 MG PO TABS
5.0000 mg | ORAL_TABLET | Freq: Two times a day (BID) | ORAL | Status: DC
  Administered 2021-04-19 – 2021-04-21 (×5): 5 mg via ORAL
  Filled 2021-04-19 (×6): qty 1

## 2021-04-19 MED ORDER — SODIUM CHLORIDE 0.9 % IV SOLN
1.0000 g | Freq: Once | INTRAVENOUS | Status: AC
  Administered 2021-04-19: 1 g via INTRAVENOUS
  Filled 2021-04-19: qty 10

## 2021-04-19 MED ORDER — ONDANSETRON HCL 4 MG/2ML IJ SOLN
4.0000 mg | Freq: Once | INTRAMUSCULAR | Status: AC
Start: 2021-04-19 — End: 2021-04-19
  Administered 2021-04-19: 4 mg via INTRAVENOUS
  Filled 2021-04-19: qty 2

## 2021-04-19 MED ORDER — SODIUM CHLORIDE 0.9 % IV SOLN
2.0000 g | INTRAVENOUS | Status: DC
  Administered 2021-04-20 – 2021-04-21 (×2): 2 g via INTRAVENOUS
  Filled 2021-04-19 (×2): qty 20

## 2021-04-19 MED ORDER — CHLORHEXIDINE GLUCONATE CLOTH 2 % EX PADS
6.0000 | MEDICATED_PAD | Freq: Every day | CUTANEOUS | Status: DC
  Administered 2021-04-19 – 2021-04-21 (×3): 6 via TOPICAL

## 2021-04-19 MED ORDER — DRONEDARONE HCL 400 MG PO TABS
400.0000 mg | ORAL_TABLET | Freq: Two times a day (BID) | ORAL | Status: DC
  Administered 2021-04-20 – 2021-04-21 (×3): 400 mg via ORAL
  Filled 2021-04-19 (×4): qty 1

## 2021-04-19 MED ORDER — DOXAZOSIN MESYLATE 8 MG PO TABS
8.0000 mg | ORAL_TABLET | Freq: Every morning | ORAL | Status: DC
  Administered 2021-04-20 – 2021-04-21 (×2): 8 mg via ORAL
  Filled 2021-04-19 (×2): qty 1

## 2021-04-19 MED ORDER — FENTANYL CITRATE PF 50 MCG/ML IJ SOSY
25.0000 ug | PREFILLED_SYRINGE | Freq: Once | INTRAMUSCULAR | Status: AC
  Administered 2021-04-19: 25 ug via INTRAVENOUS
  Filled 2021-04-19: qty 1

## 2021-04-19 MED ORDER — VANCOMYCIN HCL 1750 MG/350ML IV SOLN
1750.0000 mg | Freq: Once | INTRAVENOUS | Status: AC
  Administered 2021-04-19: 1750 mg via INTRAVENOUS
  Filled 2021-04-19: qty 350

## 2021-04-19 MED ORDER — LORAZEPAM 2 MG/ML IJ SOLN
1.0000 mg | Freq: Three times a day (TID) | INTRAMUSCULAR | Status: DC | PRN
  Administered 2021-04-19 – 2021-04-20 (×3): 1 mg via INTRAVENOUS
  Filled 2021-04-19 (×3): qty 1

## 2021-04-19 NOTE — Consult Note (Signed)
Urology Consult   Reason for consult: possible UTI  History of Present Illness: Carlos Short. is a 71 y.o. known to GU for a history of bladder stone, renal stones, and incomplete bladder emptying requiring CIC.  He presented to the ED early this morning c/o fever and confusion at home, as well as some left flank pain.   A CT scan was obtained in the ED; I reviewed the images and overall they are stable relative to previous scans. There is a ~2 cm stone in the bladder and non-obstructing L renal stones.  Labs in ED notable for a small elevation in his WBCs, UA positive for RBCs, WBCs and LE. Urine + Bloood cultures are pending  Carlos Short states he had been performing CIC BID without issue at home. Between catheterizations, his voiding was somewhat sporadic - sometimes he would void large amounts on his own, sometimes very little.   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 Corliss Skains, MD;  Location: [facility];  Service: General;  Laterality: Bilateral;   INSERTION OF MESH Bilateral 08/16/2012   Procedure: INSERTION OF MESH;  Surgeon: Wilmon Arms. Corliss Skains, MD;  Location: [facility];   Service: General;  Laterality: Bilateral;   MULTIPLE TOOTH EXTRACTIONS     TONSILLECTOMY     age 5   UPPER GI ENDOSCOPY      Current Hospital Medications:  Home Meds:  No current facility-administered medications on file prior to encounter.   Current Outpatient Medications on File Prior to Encounter  Medication Sig Dispense Refill   apixaban (ELIQUIS) 5 MG TABS tablet Take 1 tablet (5 mg total) by mouth 2 (two) times daily. 60 tablet 3   doxazosin (CARDURA) 8 MG tablet Take 1 tablet (8 mg total) by mouth daily.     dronedarone (MULTAQ) 400 MG tablet Take 400 mg by mouth 2 (two) times daily with a meal.     fluticasone (FLONASE) 50 MCG/ACT nasal spray Place 1 spray into both nostrils daily as needed for allergies.     ibandronate (BONIVA) 150 MG tablet Take 150 mg by mouth every 30 (thirty) days. 3rd of each month     metoprolol succinate (TOPROL-XL) 25 MG 24 hr tablet Take 1 tablet (25 mg total) by mouth daily. Take with or immediately following a meal. 45 tablet 3   rOPINIRole (REQUIP) 1 MG tablet Take 1 mg by mouth 2 (two) times daily as needed (restless legs).     traMADol (ULTRAM) 50 MG tablet Take 50-100 mg by mouth every 6 (six) hours as needed for  moderate pain.     traZODone (DESYREL) 100 MG tablet Take 50-100 mg by mouth at bedtime as needed for pain.       Scheduled Meds: Continuous Infusions:  cefTRIAXone (ROCEPHIN)  IV     [START ON 04/20/2021] cefTRIAXone (ROCEPHIN)  IV     [START ON 04/20/2021] vancomycin     PRN Meds:.HYDROmorphone (DILAUDID) injection, prochlorperazine  Allergies:  Allergies  Allergen Reactions   Gabapentin     crazy    Family History  Problem Relation Age of Onset   Depression Mother    Hypertension Father    Heart attack Father 61       2 HEART ATTACKS   Kidney cancer Father    Lupus Father    Depression Brother    Migraines Neg Hx     Social History:  reports that he has been smoking cigarettes. He has a 12.50 pack-year smoking  history. He has never used smokeless tobacco. He reports that he does not currently use alcohol. He reports current drug use. Drugs: Cocaine, Marijuana, and Amphetamines.  ROS: A complete review of systems was performed.  All systems are negative except for pertinent findings as noted.  Physical Exam:  Vital signs in last 24 hours: Temp:  [98.3 F (36.8 C)-98.4 F (36.9 C)] 98.3 F (36.8 C) (02/07 4163) Pulse Rate:  [45-77] 77 (02/07 0630) Resp:  [15-23] 17 (02/07 0700) BP: (120-144)/(70-103) 136/78 (02/07 0700) SpO2:  [91 %-98 %] 91 % (02/07 0700) Weight:  [88.9 kg] 88.9 kg (02/07 0111) Constitutional:  Alert and oriented, No acute distress Cardiovascular: Regular rate and rhythm Respiratory: Normal respiratory effort, Lungs clear bilaterally GI: Abdomen is soft, nontender, nondistended, no abdominal masses GU: No CVA tenderness Neurologic: Grossly intact, no focal deficits Psychiatric: Normal mood and affect  Laboratory Data:  Recent Labs    04/19/21 0045  WBC 14.2*  HGB 14.3  HCT 42.2  PLT 194    Recent Labs    04/19/21 0045  NA 137  K 4.3  CL 107  GLUCOSE 95  BUN 24*  CALCIUM 9.0  CREATININE 1.36*     Results for orders placed or performed during the hospital encounter of 04/18/21 (from the past 24 hour(s))  Comprehensive metabolic panel     Status: Abnormal   Collection Time: 04/19/21 12:45 AM  Result Value Ref Range   Sodium 137 135 - 145 mmol/L   Potassium 4.3 3.5 - 5.1 mmol/L   Chloride 107 98 - 111 mmol/L   CO2 24 22 - 32 mmol/L   Glucose, Bld 95 70 - 99 mg/dL   BUN 24 (H) 8 - 23 mg/dL   Creatinine, Ser 8.45 (H) 0.61 - 1.24 mg/dL   Calcium 9.0 8.9 - 36.4 mg/dL   Total Protein 7.4 6.5 - 8.1 g/dL   Albumin 3.8 3.5 - 5.0 g/dL   AST 16 15 - 41 U/L   ALT 18 0 - 44 U/L   Alkaline Phosphatase 58 38 - 126 U/L   Total Bilirubin 0.5 0.3 - 1.2 mg/dL   GFR, Estimated 56 (L) >60 mL/min   Anion gap 6 5 - 15  Lactic acid, plasma     Status: None    Collection Time: 04/19/21 12:45 AM  Result Value Ref Range   Lactic Acid, Venous 1.1 0.5 - 1.9 mmol/L  CBC with Differential     Status: Abnormal   Collection Time: 04/19/21 12:45 AM  Result Value Ref Range   WBC 14.2 (H)  4.0 - 10.5 K/uL   RBC 4.45 4.22 - 5.81 MIL/uL   Hemoglobin 14.3 13.0 - 17.0 g/dL   HCT 16.142.2 09.639.0 - 04.552.0 %   MCV 94.8 80.0 - 100.0 fL   MCH 32.1 26.0 - 34.0 pg   MCHC 33.9 30.0 - 36.0 g/dL   RDW 40.913.8 81.111.5 - 91.415.5 %   Platelets 194 150 - 400 K/uL   nRBC 0.0 0.0 - 0.2 %   Neutrophils Relative % 72 %   Neutro Abs 10.1 (H) 1.7 - 7.7 K/uL   Lymphocytes Relative 17 %   Lymphs Abs 2.4 0.7 - 4.0 K/uL   Monocytes Relative 9 %   Monocytes Absolute 1.3 (H) 0.1 - 1.0 K/uL   Eosinophils Relative 2 %   Eosinophils Absolute 0.3 0.0 - 0.5 K/uL   Basophils Relative 0 %   Basophils Absolute 0.0 0.0 - 0.1 K/uL   Immature Granulocytes 0 %   Abs Immature Granulocytes 0.06 0.00 - 0.07 K/uL  Protime-INR     Status: None   Collection Time: 04/19/21 12:45 AM  Result Value Ref Range   Prothrombin Time 12.7 11.4 - 15.2 seconds   INR 1.0 0.8 - 1.2  Urinalysis, Routine w reflex microscopic     Status: Abnormal   Collection Time: 04/19/21 12:45 AM  Result Value Ref Range   Color, Urine YELLOW YELLOW   APPearance CLOUDY (A) CLEAR   Specific Gravity, Urine 1.012 1.005 - 1.030   pH 7.0 5.0 - 8.0   Glucose, UA NEGATIVE NEGATIVE mg/dL   Hgb urine dipstick LARGE (A) NEGATIVE   Bilirubin Urine NEGATIVE NEGATIVE   Ketones, ur NEGATIVE NEGATIVE mg/dL   Protein, ur 782100 (A) NEGATIVE mg/dL   Nitrite NEGATIVE NEGATIVE   Leukocytes,Ua LARGE (A) NEGATIVE   RBC / HPF >50 (H) 0 - 5 RBC/hpf   WBC, UA >50 (H) 0 - 5 WBC/hpf   Bacteria, UA NONE SEEN NONE SEEN  Resp Panel by RT-PCR (Flu A&B, Covid)     Status: None   Collection Time: 04/19/21 12:45 AM   Specimen: Nasopharyngeal(NP) swabs in vial transport medium  Result Value Ref Range   SARS Coronavirus 2 by RT PCR NEGATIVE NEGATIVE   Influenza A  by PCR NEGATIVE NEGATIVE   Influenza B by PCR NEGATIVE NEGATIVE   Recent Results (from the past 240 hour(s))  Resp Panel by RT-PCR (Flu A&B, Covid)     Status: None   Collection Time: 04/19/21 12:45 AM   Specimen: Nasopharyngeal(NP) swabs in vial transport medium  Result Value Ref Range Status   SARS Coronavirus 2 by RT PCR NEGATIVE NEGATIVE Final    Comment: (NOTE) SARS-CoV-2 target nucleic acids are NOT DETECTED.  The SARS-CoV-2 RNA is generally detectable in upper respiratory specimens during the acute phase of infection. The lowest concentration of SARS-CoV-2 viral copies this assay can detect is 138 copies/mL. A negative result does not preclude SARS-Cov-2 infection and should not be used as the sole basis for treatment or other patient management decisions. A negative result may occur with  improper specimen collection/handling, submission of specimen other than nasopharyngeal swab, presence of viral mutation(s) within the areas targeted by this assay, and inadequate number of viral copies(<138 copies/mL). A negative result must be combined with clinical observations, patient history, and epidemiological information. The expected result is Negative.  Fact Sheet for Patients:  BloggerCourse.comhttps://www.fda.gov/media/152166/download  Fact Sheet for Healthcare Providers:  SeriousBroker.ithttps://www.fda.gov/media/152162/download  This test is no t yet approved or cleared by the  [place] FDA and  °has been authorized for detection and/or diagnosis of SARS-CoV-2 by °FDA under an Emergency Use Authorization (EUA). This EUA will remain  °in effect (meaning this test can be used) for the duration of the °COVID-19 declaration under Section 564(b)(1) of the Act, 21 °U.S.C.section 360bbb-3(b)(1), unless the authorization is terminated  °or revoked sooner.  ° ° °  ° Influenza A by PCR NEGATIVE NEGATIVE Final  ° Influenza B by PCR NEGATIVE NEGATIVE Final  °  Comment: (NOTE) °The Xpert Xpress SARS-CoV-2/FLU/RSV plus  assay is intended as an aid °in the diagnosis of influenza from Nasopharyngeal swab specimens and °should not be used as a sole basis for treatment. Nasal washings and °aspirates are unacceptable for Xpert Xpress SARS-CoV-2/FLU/RSV °testing. ° °Fact Sheet for Patients: °https://www.fda.gov/media/152166/download ° °Fact Sheet for Healthcare Providers: °https://www.fda.gov/media/152162/download ° °This test is not yet approved or cleared by the [place] FDA and °has been authorized for detection and/or diagnosis of SARS-CoV-2 by °FDA under an Emergency Use Authorization (EUA). This EUA will remain °in effect (meaning this test can be used) for the duration of the °COVID-19 declaration under Section 564(b)(1) of the Act, 21 U.S.C. °section 360bbb-3(b)(1), unless the authorization is terminated or °revoked. ° °Performed at [facility], [address] °[place], [place] [address] °  ° ° °Renal Function: °Recent Labs  °  04/19/21 °0045  °CREATININE 1.36*  ° °Estimated Creatinine Clearance: 53.8 mL/min (A) (by C-G formula based on SCr of 1.36 mg/dL (H)). ° °Radiologic Imaging: °DG Chest 2 View ° °Result Date: 04/18/2021 °CLINICAL DATA:  Concern for sepsis.  Fever. EXAM: CHEST - 2 VIEW COMPARISON:  Chest x-ray 03/31/2021. FINDINGS: The heart size and mediastinal contours are within normal limits. Both lungs are clear. The visualized skeletal structures are unremarkable. IMPRESSION: No active cardiopulmonary disease. Electronically Signed   By: Amy  Guttmann M.D.   On: 04/18/2021 22:53  ° °CT Renal Stone Study ° °Result Date: 04/19/2021 °CLINICAL DATA:  Flank pain, kidney stone suspected EXAM: CT ABDOMEN AND PELVIS WITHOUT CONTRAST TECHNIQUE: Multidetector CT imaging of the abdomen and pelvis was performed following the standard protocol without IV contrast. RADIATION DOSE REDUCTION: This exam was performed according to the departmental dose-optimization program which includes automated exposure control,  adjustment of the mA and/or kV according to patient size and/or use of iterative reconstruction technique. COMPARISON:  04/01/2021 FINDINGS: Lower chest: Small hiatal hernia.  No acute abnormality. Hepatobiliary: Small hypodensity in the right hepatic lobe likely small cyst measuring 10 mm. No suspicious focal hepatic abnormality. Gallbladder unremarkable. Pancreas: No focal abnormality or ductal dilatation. Spleen: Small scattered calcifications compatible with old granulomatous disease. Normal size. Adrenals/Urinary Tract: Bladder stone layering dependently measures 19 mm. Left renal parapelvic cysts. Multiple left renal stones, nonobstructing. The largest measures 10 mm in the midpole. No stones or hydronephrosis on the right. Adrenal glands unremarkable. Stomach/Bowel: Sigmoid diverticulosis. No active diverticulitis. Stomach and small bowel decompressed, unremarkable. Vascular/Lymphatic: Aortic aneurysm measuring 4.6 cm, unchanged since prior study. Aortic atherosclerosis. No adenopathy. Reproductive: No visible focal abnormality. Other: No free fluid or free air. Musculoskeletal: Prior right hip replacement. No acute bony abnormality. IMPRESSION: Left nephrolithiasis.  No ureteral stones or hydronephrosis. Left renal parapelvic cysts, thought to represent hydronephrosis on prior study. 19 mm bladder stone. 4.6 cm infrarenal abdominal aortic aneurysm. Sigmoid diverticulosis. Small hiatal hernia. Electronically Signed   By: Kevin  Dover M.D.   On: 04/19/2021 02:47   ° °I independently reviewed the above imaging studies. ° °  Impression/Recommendation 71 yo M in ED with presumed UTI, associated encephalopathy, with bladder stone, non obstructing left renal stones, and history of incomplete bladder emptying managed with CIC  Carlos Chapple was scheduled to undergo cystolithalopaxy today in the OR. I will cancel this and reschedule the procedure.  While an inpatient, I would recommend placing an indwelling foley  catheter given his history of incomplete bladder emptying.  F/u cultures, continue broad spectrum abx in the meantime.   Irine Seal MD 04/19/2021, 9:13 AM  [facility]  Pager: [phone]

## 2021-04-19 NOTE — ED Notes (Signed)
Pt called RN in room and states he is feeling like he is "having trouble moving air"; pt O2 sats at 98% and pt does not appear to be in any distress; pt states he has been having this problem for the last year; will continue to monitor

## 2021-04-19 NOTE — Progress Notes (Signed)
Pharmacy Antibiotic Note  Carlos Short. is a 71 y.o. male admitted on 04/18/2021 with  history of hypertension, recent history of pyelonephritis secondary to stone presents with elevated temperature and confusion.  Pharmacy has been consulted to dose vancomycin for previous staph epi in urine.  Plan: Vancomycin 1750mg  IV x 1 then 1500mg  q24h (AUC 477.5,  Scr 1.36) Follow renal function, cultures and clinical course  Height: 5\' 11"  (180.3 cm) Weight: 88.9 kg (195 lb 15.8 oz) IBW/kg (Calculated) : 75.3  Temp (24hrs), Avg:98.4 F (36.9 C), Min:98.4 F (36.9 C), Max:98.4 F (36.9 C)  Recent Labs  Lab 04/19/21 0045  WBC 14.2*  CREATININE 1.36*  LATICACIDVEN 1.1    Estimated Creatinine Clearance: 53.8 mL/min (A) (by C-G formula based on SCr of 1.36 mg/dL (H)).    Allergies  Allergen Reactions   Gabapentin     crazy    Antimicrobials this admission: 2/7 vanc >> 2/7 CTX x 1  Dose adjustments this admission:   Microbiology results: 2/7 BCx:  2/7 UCx:   Thank you for allowing pharmacy to be a part of this patients care. 4/7 RPh 04/19/2021, 2:09 AM

## 2021-04-19 NOTE — ED Notes (Signed)
Pt dressed, out of bed, saying he just needs to go home. Wife states she thinks he is hopeless, interested in finding someone to talk to him or possibly palliative care if it would he helpful. Paged Ronaldo Miyamoto MD about agitation and or/medication.

## 2021-04-19 NOTE — H&P (Addendum)
History and Physical    Patient: Carlos Short. CWU:[id number] DOB: 1950/10/29 DOA: 04/18/2021 DOS: the patient was seen and examined on 04/19/2021 PCP: Barbie Banner, MD  Patient coming from: Home  Chief Complaint:  Chief Complaint  Patient presents with   Fever   Altered Mental Status    HPI: Carlos Short. is a 71 y.o. male with medical history significant of HFrEF, a fib on eliquis, HTN, HLD. Presenting with fever and confusion. History is per wife, as patient is unable to tell me at this time. Yesterday morning the patient when to a doctors appointment. The wife noticed that he seemed a little off, a little confused. As the day went on, he worsened. She decided to check his temperature, but it wasn't high yet. A couple hours later, he started having left flank pain. She thought he was passing a stone, so she gave him some ice for his pain. She noticed that he was having difficulty doing simple things and his confusion was worsening.. She checked his temp and it was 100.3. They called their home nurse, and it was recommended that they come to the ED.   Of note, the patient was scheduled to have a cystoscopy and bladder stone removal w/ urology today.   Review of Systems: As mentioned in the history of present illness. All other systems reviewed and are negative. 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 Corliss Skains, MD;  Location: [facility];  Service: General;  Laterality: Bilateral;   INSERTION OF MESH Bilateral 08/16/2012   Procedure: INSERTION OF MESH;  Surgeon: Wilmon Arms. Corliss Skains, MD;  Location: [facility];  Service: General;  Laterality: Bilateral;   MULTIPLE TOOTH EXTRACTIONS     TONSILLECTOMY     age 35   UPPER GI ENDOSCOPY     Social History:  reports that he has been smoking cigarettes. He has a 12.50 pack-year smoking history. He has never used smokeless tobacco. He reports that he does not currently use alcohol. He reports current drug use. Drugs: Cocaine, Marijuana, and Amphetamines.  Allergies  Allergen Reactions   Gabapentin     crazy    Family History  Problem Relation Age of Onset   Depression Mother    Hypertension Father    Heart attack Father 60       2 HEART ATTACKS   Kidney cancer Father    Lupus Father    Depression Brother    Migraines Neg Hx     Prior to Admission medications   Medication Sig Start Date End Date  Taking? Authorizing Provider  apixaban (ELIQUIS) 5 MG TABS tablet Take 1 tablet (5 mg total) by mouth 2 (two) times daily. 03/18/21   Cantwell, Celeste C, PA-C  doxazosin (CARDURA) 8 MG tablet Take 1 tablet (8 mg total) by mouth daily. 04/04/21   Rhetta Mura, MD  dronedarone (MULTAQ) 400 MG tablet Take 400 mg by mouth 2 (two) times daily with a meal.    [provider]  fluticasone (FLONASE) 50 MCG/ACT nasal spray Place 1 spray into both nostrils daily as needed for allergies. 12/15/20   [provider]  ibandronate (BONIVA) 150 MG tablet Take 150 mg by mouth every 30 (thirty) days. 3rd of each month 11/05/20   [provider]  metoprolol succinate (TOPROL-XL) 25 MG 24 hr tablet Take 1  tablet (25 mg total) by mouth daily. Take with or immediately following a meal. 02/17/21 08/16/21  Cantwell, Celeste C, PA-C  rOPINIRole (REQUIP) 1 MG tablet Take 1 mg by mouth 2 (two) times daily as needed (restless legs). 12/06/20   [provider]  traMADol (ULTRAM) 50 MG tablet Take 50-100 mg by mouth every 6 (six) hours as needed for moderate pain.    [provider]    Physical Exam: Vitals:   04/19/21 0530 04/19/21 0613 04/19/21 0630 04/19/21 0700  BP: 136/81 133/90 (!) 144/72 136/78  Pulse:  60 77   Resp: 20 18 15 17   Temp:  98.3 F (36.8 C)    TempSrc:  Oral    SpO2: 98% 96% 96% 91%  Weight:      Height:       General: 71 y.o. male resting in bed in NAD Eyes: PERRL, normal sclera ENMT: Nares patent w/o discharge, orophaynx clear, dentition normal, ears w/o discharge/lesions/ulcers Neck: Supple, trachea midline Cardiovascular: RRR, +S1, S2, no m/g/r, equal pulses throughout Respiratory: CTABL, no w/r/r, normal WOB GI: BS+, NDNT, no masses noted, no organomegaly noted MSK: No e/c/c Neuro: A&O x 3, no focal deficits Psyc: Appropriate interaction but slightly confused, calm/cooperative   Data Reviewed:  Scr 1.36 WBC 14.2 UA: +leukocytes, cloudy CT renal stone study: left nephrolithiasis, 19 mm bladder stone  Assessment and Plan: No notes have been filed under this hospital service. Service: Hospitalist UTI Nephrolithiasis Bladder calculi     - placed in obs, med-surg     - continue vanc, rocephin; follow Ucx     - urology onboard; keep him NPO for right now until they determine procedure time  Acute metabolic encephalopathy     - as a function of above, treat infection, monitor     - he is A&O x 3, but still slightly confused  Urinary retention     - he self caths     - bladder scan q6h, I&O for retention grtr than 250cc  AKI     - as a function of above  A fib on eliquis     - eliquis held for surgery     - resume home regimen  post-procedure  HTN Chronic HFrEF     - resume home regimen when he comes off NPO status  Abdominal aortic aneurysmal     - see on previous CT (at the time 4.3-4.4 cm); will need outpt follow up  Advance Care Planning: FULL  Consults: EDP spoke with urology  Family Communication: w/ wife by phone  Severity of Illness: The appropriate patient status for this patient is INPATIENT. Inpatient status is judged to be reasonable and necessary in order  to provide the required intensity of service to ensure the patient's safety. The patient's presenting symptoms, physical exam findings, and initial radiographic and laboratory data in the context of their chronic comorbidities is felt to place them at high risk for further clinical deterioration. Furthermore, it is not anticipated that the patient will be medically stable for discharge from the hospital within 2 midnights of admission.   * I certify that at the point of admission it is my clinical judgment that the patient will require inpatient hospital care spanning beyond 2 midnights from the point of admission due to high intensity of service, high risk for further deterioration and high frequency of surveillance required.*   Author: Teddy Spike, DO 04/19/2021 7:30 AM  For on call review [email or web address].

## 2021-04-19 NOTE — H&P (View-Only) (Signed)
Urology Consult   Reason for consult: possible UTI  History of Present Illness: Carlos Short. is a 71 y.o. known to GU for a history of bladder stone, renal stones, and incomplete bladder emptying requiring CIC.  He presented to the ED early this morning c/o fever and confusion at home, as well as some left flank pain.   A CT scan was obtained in the ED; I reviewed the images and overall they are stable relative to previous scans. There is a ~2 cm stone in the bladder and non-obstructing L renal stones.  Labs in ED notable for a small elevation in his WBCs, UA positive for RBCs, WBCs and LE. Urine + Bloood cultures are pending  Carlos Short states he had been performing CIC BID without issue at home. Between catheterizations, his voiding was somewhat sporadic - sometimes he would void large amounts on his own, sometimes very little.   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 Corliss Skains, MD;  Location: [facility];  Service: General;  Laterality: Bilateral;   INSERTION OF MESH Bilateral 08/16/2012   Procedure: INSERTION OF MESH;  Surgeon: Wilmon Arms. Corliss Skains, MD;  Location: [facility];   Service: General;  Laterality: Bilateral;   MULTIPLE TOOTH EXTRACTIONS     TONSILLECTOMY     age 5   UPPER GI ENDOSCOPY      Current Hospital Medications:  Home Meds:  No current facility-administered medications on file prior to encounter.   Current Outpatient Medications on File Prior to Encounter  Medication Sig Dispense Refill   apixaban (ELIQUIS) 5 MG TABS tablet Take 1 tablet (5 mg total) by mouth 2 (two) times daily. 60 tablet 3   doxazosin (CARDURA) 8 MG tablet Take 1 tablet (8 mg total) by mouth daily.     dronedarone (MULTAQ) 400 MG tablet Take 400 mg by mouth 2 (two) times daily with a meal.     fluticasone (FLONASE) 50 MCG/ACT nasal spray Place 1 spray into both nostrils daily as needed for allergies.     ibandronate (BONIVA) 150 MG tablet Take 150 mg by mouth every 30 (thirty) days. 3rd of each month     metoprolol succinate (TOPROL-XL) 25 MG 24 hr tablet Take 1 tablet (25 mg total) by mouth daily. Take with or immediately following a meal. 45 tablet 3   rOPINIRole (REQUIP) 1 MG tablet Take 1 mg by mouth 2 (two) times daily as needed (restless legs).     traMADol (ULTRAM) 50 MG tablet Take 50-100 mg by mouth every 6 (six) hours as needed for  moderate pain.     traZODone (DESYREL) 100 MG tablet Take 50-100 mg by mouth at bedtime as needed for pain.       Scheduled Meds: Continuous Infusions:  cefTRIAXone (ROCEPHIN)  IV     [START ON 04/20/2021] cefTRIAXone (ROCEPHIN)  IV     [START ON 04/20/2021] vancomycin     PRN Meds:.HYDROmorphone (DILAUDID) injection, prochlorperazine  Allergies:  Allergies  Allergen Reactions   Gabapentin     crazy    Family History  Problem Relation Age of Onset   Depression Mother    Hypertension Father    Heart attack Father 61       2 HEART ATTACKS   Kidney cancer Father    Lupus Father    Depression Brother    Migraines Neg Hx     Social History:  reports that he has been smoking cigarettes. He has a 12.50 pack-year smoking  history. He has never used smokeless tobacco. He reports that he does not currently use alcohol. He reports current drug use. Drugs: Cocaine, Marijuana, and Amphetamines.  ROS: A complete review of systems was performed.  All systems are negative except for pertinent findings as noted.  Physical Exam:  Vital signs in last 24 hours: Temp:  [98.3 F (36.8 C)-98.4 F (36.9 C)] 98.3 F (36.8 C) (02/07 4163) Pulse Rate:  [45-77] 77 (02/07 0630) Resp:  [15-23] 17 (02/07 0700) BP: (120-144)/(70-103) 136/78 (02/07 0700) SpO2:  [91 %-98 %] 91 % (02/07 0700) Weight:  [88.9 kg] 88.9 kg (02/07 0111) Constitutional:  Alert and oriented, No acute distress Cardiovascular: Regular rate and rhythm Respiratory: Normal respiratory effort, Lungs clear bilaterally GI: Abdomen is soft, nontender, nondistended, no abdominal masses GU: No CVA tenderness Neurologic: Grossly intact, no focal deficits Psychiatric: Normal mood and affect  Laboratory Data:  Recent Labs    04/19/21 0045  WBC 14.2*  HGB 14.3  HCT 42.2  PLT 194    Recent Labs    04/19/21 0045  NA 137  K 4.3  CL 107  GLUCOSE 95  BUN 24*  CALCIUM 9.0  CREATININE 1.36*     Results for orders placed or performed during the hospital encounter of 04/18/21 (from the past 24 hour(s))  Comprehensive metabolic panel     Status: Abnormal   Collection Time: 04/19/21 12:45 AM  Result Value Ref Range   Sodium 137 135 - 145 mmol/L   Potassium 4.3 3.5 - 5.1 mmol/L   Chloride 107 98 - 111 mmol/L   CO2 24 22 - 32 mmol/L   Glucose, Bld 95 70 - 99 mg/dL   BUN 24 (H) 8 - 23 mg/dL   Creatinine, Ser 8.45 (H) 0.61 - 1.24 mg/dL   Calcium 9.0 8.9 - 36.4 mg/dL   Total Protein 7.4 6.5 - 8.1 g/dL   Albumin 3.8 3.5 - 5.0 g/dL   AST 16 15 - 41 U/L   ALT 18 0 - 44 U/L   Alkaline Phosphatase 58 38 - 126 U/L   Total Bilirubin 0.5 0.3 - 1.2 mg/dL   GFR, Estimated 56 (L) >60 mL/min   Anion gap 6 5 - 15  Lactic acid, plasma     Status: None    Collection Time: 04/19/21 12:45 AM  Result Value Ref Range   Lactic Acid, Venous 1.1 0.5 - 1.9 mmol/L  CBC with Differential     Status: Abnormal   Collection Time: 04/19/21 12:45 AM  Result Value Ref Range   WBC 14.2 (H)  4.0 - 10.5 K/uL   RBC 4.45 4.22 - 5.81 MIL/uL   Hemoglobin 14.3 13.0 - 17.0 g/dL   HCT 16.142.2 09.639.0 - 04.552.0 %   MCV 94.8 80.0 - 100.0 fL   MCH 32.1 26.0 - 34.0 pg   MCHC 33.9 30.0 - 36.0 g/dL   RDW 40.913.8 81.111.5 - 91.415.5 %   Platelets 194 150 - 400 K/uL   nRBC 0.0 0.0 - 0.2 %   Neutrophils Relative % 72 %   Neutro Abs 10.1 (H) 1.7 - 7.7 K/uL   Lymphocytes Relative 17 %   Lymphs Abs 2.4 0.7 - 4.0 K/uL   Monocytes Relative 9 %   Monocytes Absolute 1.3 (H) 0.1 - 1.0 K/uL   Eosinophils Relative 2 %   Eosinophils Absolute 0.3 0.0 - 0.5 K/uL   Basophils Relative 0 %   Basophils Absolute 0.0 0.0 - 0.1 K/uL   Immature Granulocytes 0 %   Abs Immature Granulocytes 0.06 0.00 - 0.07 K/uL  Protime-INR     Status: None   Collection Time: 04/19/21 12:45 AM  Result Value Ref Range   Prothrombin Time 12.7 11.4 - 15.2 seconds   INR 1.0 0.8 - 1.2  Urinalysis, Routine w reflex microscopic     Status: Abnormal   Collection Time: 04/19/21 12:45 AM  Result Value Ref Range   Color, Urine YELLOW YELLOW   APPearance CLOUDY (A) CLEAR   Specific Gravity, Urine 1.012 1.005 - 1.030   pH 7.0 5.0 - 8.0   Glucose, UA NEGATIVE NEGATIVE mg/dL   Hgb urine dipstick LARGE (A) NEGATIVE   Bilirubin Urine NEGATIVE NEGATIVE   Ketones, ur NEGATIVE NEGATIVE mg/dL   Protein, ur 782100 (A) NEGATIVE mg/dL   Nitrite NEGATIVE NEGATIVE   Leukocytes,Ua LARGE (A) NEGATIVE   RBC / HPF >50 (H) 0 - 5 RBC/hpf   WBC, UA >50 (H) 0 - 5 WBC/hpf   Bacteria, UA NONE SEEN NONE SEEN  Resp Panel by RT-PCR (Flu A&B, Covid)     Status: None   Collection Time: 04/19/21 12:45 AM   Specimen: Nasopharyngeal(NP) swabs in vial transport medium  Result Value Ref Range   SARS Coronavirus 2 by RT PCR NEGATIVE NEGATIVE   Influenza A  by PCR NEGATIVE NEGATIVE   Influenza B by PCR NEGATIVE NEGATIVE   Recent Results (from the past 240 hour(s))  Resp Panel by RT-PCR (Flu A&B, Covid)     Status: None   Collection Time: 04/19/21 12:45 AM   Specimen: Nasopharyngeal(NP) swabs in vial transport medium  Result Value Ref Range Status   SARS Coronavirus 2 by RT PCR NEGATIVE NEGATIVE Final    Comment: (NOTE) SARS-CoV-2 target nucleic acids are NOT DETECTED.  The SARS-CoV-2 RNA is generally detectable in upper respiratory specimens during the acute phase of infection. The lowest concentration of SARS-CoV-2 viral copies this assay can detect is 138 copies/mL. A negative result does not preclude SARS-Cov-2 infection and should not be used as the sole basis for treatment or other patient management decisions. A negative result may occur with  improper specimen collection/handling, submission of specimen other than nasopharyngeal swab, presence of viral mutation(s) within the areas targeted by this assay, and inadequate number of viral copies(<138 copies/mL). A negative result must be combined with clinical observations, patient history, and epidemiological information. The expected result is Negative.  Fact Sheet for Patients:  BloggerCourse.comhttps://www.fda.gov/media/152166/download  Fact Sheet for Healthcare Providers:  SeriousBroker.ithttps://www.fda.gov/media/152162/download  This test is no t yet approved or cleared by the  [place] [employer] FDA and  has been authorized for detection and/or diagnosis of SARS-CoV-2 by FDA under an TEFL teachermergency Use Authorization (EUA). This EUA will remain  in effect (meaning this test can be used) for the duration of the COVID-19 declaration under Section 564(b)(1) of the Act, 21 U.S.C.section 360bbb-3(b)(1), unless the authorization is terminated  or revoked sooner.       Influenza A by PCR NEGATIVE NEGATIVE Final   Influenza B by PCR NEGATIVE NEGATIVE Final    Comment: (NOTE) The Xpert Xpress SARS-CoV-2/FLU/RSV plus  assay is intended as an aid in the diagnosis of influenza from Nasopharyngeal swab specimens and should not be used as a sole basis for treatment. Nasal washings and aspirates are unacceptable for Xpert Xpress SARS-CoV-2/FLU/RSV testing.  Fact Sheet for Patients: BloggerCourse.comhttps://www.fda.gov/media/152166/download  Fact Sheet for Healthcare Providers: SeriousBroker.ithttps://www.fda.gov/media/152162/download  This test is not yet approved or cleared by the [place] FDA and has been authorized for detection and/or diagnosis of SARS-CoV-2 by FDA under an Emergency Use Authorization (EUA). This EUA will remain in effect (meaning this test can be used) for the duration of the COVID-19 declaration under Section 564(b)(1) of the Act, 21 U.S.C. section 360bbb-3(b)(1), unless the authorization is terminated or revoked.  Performed at [facility], [address] [place], [place] [address]     Renal Function: Recent Labs    04/19/21 0045  CREATININE 1.36*   Estimated Creatinine Clearance: 53.8 mL/min (A) (by C-G formula based on SCr of 1.36 mg/dL (H)).  Radiologic Imaging: DG Chest 2 View  Result Date: 04/18/2021 CLINICAL DATA:  Concern for sepsis.  Fever. EXAM: CHEST - 2 VIEW COMPARISON:  Chest x-ray 03/31/2021. FINDINGS: The heart size and mediastinal contours are within normal limits. Both lungs are clear. The visualized skeletal structures are unremarkable. IMPRESSION: No active cardiopulmonary disease. Electronically Signed   By: Darliss CheneyAmy  Guttmann M.D.   On: 04/18/2021 22:53   CT Renal Stone Study  Result Date: 04/19/2021 CLINICAL DATA:  Flank pain, kidney stone suspected EXAM: CT ABDOMEN AND PELVIS WITHOUT CONTRAST TECHNIQUE: Multidetector CT imaging of the abdomen and pelvis was performed following the standard protocol without IV contrast. RADIATION DOSE REDUCTION: This exam was performed according to the departmental dose-optimization program which includes automated exposure control,  adjustment of the mA and/or kV according to patient size and/or use of iterative reconstruction technique. COMPARISON:  04/01/2021 FINDINGS: Lower chest: Small hiatal hernia.  No acute abnormality. Hepatobiliary: Small hypodensity in the right hepatic lobe likely small cyst measuring 10 mm. No suspicious focal hepatic abnormality. Gallbladder unremarkable. Pancreas: No focal abnormality or ductal dilatation. Spleen: Small scattered calcifications compatible with old granulomatous disease. Normal size. Adrenals/Urinary Tract: Bladder stone layering dependently measures 19 mm. Left renal parapelvic cysts. Multiple left renal stones, nonobstructing. The largest measures 10 mm in the midpole. No stones or hydronephrosis on the right. Adrenal glands unremarkable. Stomach/Bowel: Sigmoid diverticulosis. No active diverticulitis. Stomach and small bowel decompressed, unremarkable. Vascular/Lymphatic: Aortic aneurysm measuring 4.6 cm, unchanged since prior study. Aortic atherosclerosis. No adenopathy. Reproductive: No visible focal abnormality. Other: No free fluid or free air. Musculoskeletal: Prior right hip replacement. No acute bony abnormality. IMPRESSION: Left nephrolithiasis.  No ureteral stones or hydronephrosis. Left renal parapelvic cysts, thought to represent hydronephrosis on prior study. 19 mm bladder stone. 4.6 cm infrarenal abdominal aortic aneurysm. Sigmoid diverticulosis. Small hiatal hernia. Electronically Signed   By: Charlett NoseKevin  Dover M.D.   On: 04/19/2021 02:47    I independently reviewed the above imaging studies.  Impression/Recommendation 71 yo M in ED with presumed UTI, associated encephalopathy, with bladder stone, non obstructing left renal stones, and history of incomplete bladder emptying managed with CIC  Carlos Short was scheduled to undergo cystolithalopaxy today in the OR. I will cancel this and reschedule the procedure.  While an inpatient, I would recommend placing an indwelling foley  catheter given his history of incomplete bladder emptying.  F/u cultures, continue broad spectrum abx in the meantime.   Irine Seal MD 04/19/2021, 9:13 AM  [facility]  Pager: [phone]

## 2021-04-19 NOTE — ED Notes (Signed)
Pt assisted to [facility] for BM.

## 2021-04-19 NOTE — ED Provider Notes (Signed)
[facility] DEPT Provider Note   CSN: [id number] Arrival date & time: 04/18/21  2213     History  Chief Complaint  Patient presents with   Fever   Altered Mental Status    Carlos Short. is a 71 y.o. male.  The history is provided by the patient and the spouse.  Fever Max temp prior to arrival:  100.3 Severity:  Moderate Onset quality:  Sudden Timing:  Constant Progression:  Improving Chronicity:  New Relieved by:  Nothing Worsened by:  Nothing Associated symptoms: cough   Associated symptoms: no diarrhea, no rash and no vomiting   Altered Mental Status Associated symptoms: fever   Associated symptoms: no rash and no vomiting   Patient with history of hypertension, recent history of pyelonephritis secondary to stone presents with elevated temperature and confusion.  Wife reports during the day patient began having confusion and Tmax of 100.3.  He also reported back pain.  Since that time his symptoms have began to improve.  He has had some cough, but no vomiting or diarrhea.  Patient is scheduled for cystoscopy and litho-lapaxy later in the day on February 7.  He has not been taking his anticoagulation in anticipation of surgery   Past Medical History:  Diagnosis Date   Anxiety    Depression    Dysrhythmia 01/2021   RBBB, frequent PVCs   History of kidney stones    Hyperlipidemia    Hypertension    Hypertrophy of prostate with urinary obstruction and other lower urinary tract symptoms (LUTS)    Insomnia, unspecified    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 ([place]) 01/29/2021   in hospital with sepsis   Memory difficulties    Mood disorder ([place])    Bipolar versus schizophrenia   Other chronic pain    neuropathy pain rt foot post op hip surg   Restless leg syndrome    Wears glasses    Wears partial dentures    top and bottom partial    Home Medications Prior to Admission medications   Medication Sig Start Date End Date Taking?  Authorizing Provider  apixaban (ELIQUIS) 5 MG TABS tablet Take 1 tablet (5 mg total) by mouth 2 (two) times daily. 03/18/21   Cantwell, Celeste C, PA-C  doxazosin (CARDURA) 8 MG tablet Take 1 tablet (8 mg total) by mouth daily. 04/04/21   Nita Sells, MD  dronedarone (MULTAQ) 400 MG tablet Take 400 mg by mouth 2 (two) times daily with a meal.    [provider]  fluticasone (FLONASE) 50 MCG/ACT nasal spray Place 1 spray into both nostrils daily as needed for allergies. 12/15/20   [provider]  ibandronate (BONIVA) 150 MG tablet Take 150 mg by mouth every 30 (thirty) days. 3rd of each month 11/05/20   [provider]  metoprolol succinate (TOPROL-XL) 25 MG 24 hr tablet Take 1 tablet (25 mg total) by mouth daily. Take with or immediately following a meal. 02/17/21 08/16/21  Cantwell, Celeste C, PA-C  rOPINIRole (REQUIP) 1 MG tablet Take 1 mg by mouth 2 (two) times daily as needed (restless legs). 12/06/20   [provider]  traMADol (ULTRAM) 50 MG tablet Take 50-100 mg by mouth every 6 (six) hours as needed for moderate pain.    [provider]      Allergies    Gabapentin    Review of Systems   Review of Systems  Constitutional:  Positive for fatigue and fever.  Respiratory:  Positive  for cough.   Cardiovascular:  Negative for leg swelling.  Gastrointestinal:  Negative for diarrhea and vomiting.  Genitourinary:  Positive for flank pain.  Skin:  Negative for rash.  All other systems reviewed and are negative.  Physical Exam Updated Vital Signs BP 128/86    Pulse 60    Temp 98.4 F (36.9 C) (Oral)    Resp 20    SpO2 97%  Physical Exam CONSTITUTIONAL: Elderly, no acute distress HEAD: Normocephalic/atraumatic EYES: EOMI/PERRL ENMT: Mucous membranes moist, uvula midline no erythema or exudate, no dysphonia NECK: supple no meningeal signs SPINE/BACK:entire spine nontender CV: S1/S2 noted, no murmurs/rubs/gallops noted LUNGS: Lungs are clear  to auscultation bilaterally, no apparent distress ABDOMEN: soft, nontender, no rebound or guarding, bowel sounds noted throughout abdomen GU:no cva tenderness NEURO: Pt is awake/alert/appropriate, moves all extremitiesx4.  No facial droop.   EXTREMITIES: pulses normal/equal, full ROM SKIN: warm, color normal PSYCH: no abnormalities of mood noted, alert and oriented to situation  ED Results / Procedures / Treatments   Labs (all labs ordered are listed, but only abnormal results are displayed) Labs Reviewed  COMPREHENSIVE METABOLIC PANEL - Abnormal; Notable for the following components:      Result Value   BUN 24 (*)    Creatinine, Ser 1.36 (*)    GFR, Estimated 56 (*)    All other components within normal limits  CBC WITH DIFFERENTIAL/PLATELET - Abnormal; Notable for the following components:   WBC 14.2 (*)    Neutro Abs 10.1 (*)    Monocytes Absolute 1.3 (*)    All other components within normal limits  URINALYSIS, ROUTINE W REFLEX MICROSCOPIC - Abnormal; Notable for the following components:   APPearance CLOUDY (*)    Hgb urine dipstick LARGE (*)    Protein, ur 100 (*)    Leukocytes,Ua LARGE (*)    RBC / HPF >50 (*)    WBC, UA >50 (*)    All other components within normal limits  RESP PANEL BY RT-PCR (FLU A&B, COVID) ARPGX2  CULTURE, BLOOD (ROUTINE X 2)  CULTURE, BLOOD (ROUTINE X 2)  URINE CULTURE  LACTIC ACID, PLASMA  PROTIME-INR    EKG None  Radiology DG Chest 2 View  Result Date: 04/18/2021 CLINICAL DATA:  Concern for sepsis.  Fever. EXAM: CHEST - 2 VIEW COMPARISON:  Chest x-ray 03/31/2021. FINDINGS: The heart size and mediastinal contours are within normal limits. Both lungs are clear. The visualized skeletal structures are unremarkable. IMPRESSION: No active cardiopulmonary disease. Electronically Signed   By: Ronney Asters M.D.   On: 04/18/2021 22:53   CT Renal Stone Study  Result Date: 04/19/2021 CLINICAL DATA:  Flank pain, kidney stone suspected EXAM: CT  ABDOMEN AND PELVIS WITHOUT CONTRAST TECHNIQUE: Multidetector CT imaging of the abdomen and pelvis was performed following the standard protocol without IV contrast. RADIATION DOSE REDUCTION: This exam was performed according to the departmental dose-optimization program which includes automated exposure control, adjustment of the mA and/or kV according to patient size and/or use of iterative reconstruction technique. COMPARISON:  04/01/2021 FINDINGS: Lower chest: Small hiatal hernia.  No acute abnormality. Hepatobiliary: Small hypodensity in the right hepatic lobe likely small cyst measuring 10 mm. No suspicious focal hepatic abnormality. Gallbladder unremarkable. Pancreas: No focal abnormality or ductal dilatation. Spleen: Small scattered calcifications compatible with old granulomatous disease. Normal size. Adrenals/Urinary Tract: Bladder stone layering dependently measures 19 mm. Left renal parapelvic cysts. Multiple left renal stones, nonobstructing. The largest measures 10 mm in the midpole. No stones  or hydronephrosis on the right. Adrenal glands unremarkable. Stomach/Bowel: Sigmoid diverticulosis. No active diverticulitis. Stomach and small bowel decompressed, unremarkable. Vascular/Lymphatic: Aortic aneurysm measuring 4.6 cm, unchanged since prior study. Aortic atherosclerosis. No adenopathy. Reproductive: No visible focal abnormality. Other: No free fluid or free air. Musculoskeletal: Prior right hip replacement. No acute bony abnormality. IMPRESSION: Left nephrolithiasis.  No ureteral stones or hydronephrosis. Left renal parapelvic cysts, thought to represent hydronephrosis on prior study. 19 mm bladder stone. 4.6 cm infrarenal abdominal aortic aneurysm. Sigmoid diverticulosis. Small hiatal hernia. Electronically Signed   By: Rolm Baptise M.D.   On: 04/19/2021 02:47    Procedures Procedures    Medications Ordered in ED Medications  vancomycin (VANCOREADY) IVPB 1500 mg/300 mL (has no administration  in time range)  cefTRIAXone (ROCEPHIN) 1 g in sodium chloride 0.9 % 100 mL IVPB (0 g Intravenous Stopped 04/19/21 0315)  vancomycin (VANCOREADY) IVPB 1750 mg/350 mL (0 mg Intravenous Stopped 04/19/21 0528)  ondansetron (ZOFRAN) injection 4 mg (4 mg Intravenous Given 04/19/21 0517)  fentaNYL (SUBLIMAZE) injection 25 mcg (25 mcg Intravenous Given 04/19/21 0601)    ED Course/ Medical Decision Making/ A&P Clinical Course as of 04/19/21 0625  Tue Apr 19, 2021  0110 WBC(!): 14.2 Leukocytosis [DW]  0131 Discussed case with Dr. Jeffie Pollock  with urology Recommend CT imaging.  Start IV antibiotics, and inform him of results [DW]  0139 Pt stable/resting comfortably, updated on plan  [DW]  0139 Creatinine(!): 1.36 Acute kifdney injury noted [DW]  [id number] CT reveals large bladder stone.  Patient has received IV antibiotics.  We will plan to reconsult urology [DW]  [id number] Discussed CT findings with Dr. Jeffie Pollock.  He will inform his urologist about the findings, he recommends admission to the hospitalist.  Patient has been stable in the ER [DW]  0625 Discussed with Dr. Alcario Drought for admission to the hospitalist service [DW]    Clinical Course User Index [DW] Ripley Fraise, MD                           Medical Decision Making Amount and/or Complexity of Data Reviewed Labs: ordered. Decision-making details documented in ED Course. Radiology: ordered.  Risk Prescription drug management. Decision regarding hospitalization.   This patient presents to the ED for concern of fever and altered mental status, this involves an extensive number of treatment options, and is a complaint that carries with it a high risk of complications and morbidity.  The differential diagnosis includes urosepsis, meningitis, bacteremia, pneumonia  Comorbidities that complicate the patient evaluation: Patients presentation is complicated by their history of kidney stone   Additional history obtained: Additional history obtained from  spouse Records reviewed previous admission documents  Lab Tests: I Ordered, and personally interpreted labs.  The pertinent results include: UTI, renal insufficiency  Imaging Studies ordered: I ordered imaging studies including CT scan renal Chest x-ray I independently visualized and interpreted imaging which showed chest x-ray is negative.  CT scan reveals nephrolithiasis and bladder stone I agree with the radiologist interpretation  Cardiac Monitoring: The patient was maintained on a cardiac monitor.  I personally viewed and interpreted the cardiac monitor which showed an underlying rhythm of:  sinus rhythm  Medicines ordered and prescription drug management: I ordered medication including Rocephin and vancomycin for UTI with previous history of multidrug-resistant Reevaluation of the patient after these medicines showed that the patient    improved   Critical Interventions:       IV antibiotics  Consultations Obtained: I requested consultation with the consultant urology Dr. Jeffie Pollock, and discussed  findings as well as pertinent plan - they recommend: IV antibiotics and admission  Reevaluation: After the interventions noted above, I reevaluated the patient and found that they have :stayed the same  Complexity of problems addressed: Patients presentation is most consistent with  acute presentation with potential threat to life or bodily function      Disposition: After consideration of the diagnostic results and the patients response to treatment,  I feel that the patent would benefit from admission .            Final Clinical Impression(s) / ED Diagnoses Final diagnoses:  Acute cystitis with hematuria  Bladder stone    Rx / DC Orders ED Discharge Orders     None         Ripley Fraise, MD 04/19/21 [phone]

## 2021-04-20 DIAGNOSIS — Z7901 Long term (current) use of anticoagulants: Secondary | ICD-10-CM

## 2021-04-20 DIAGNOSIS — N3001 Acute cystitis with hematuria: Secondary | ICD-10-CM

## 2021-04-20 DIAGNOSIS — N210 Calculus in bladder: Secondary | ICD-10-CM

## 2021-04-20 HISTORY — DX: Long term (current) use of anticoagulants: Z79.01

## 2021-04-20 LAB — CBC
HCT: 42.7 % (ref 39.0–52.0)
Hemoglobin: 14.1 g/dL (ref 13.0–17.0)
MCH: 31.7 pg (ref 26.0–34.0)
MCHC: 33 g/dL (ref 30.0–36.0)
MCV: 96 fL (ref 80.0–100.0)
Platelets: 175 10*3/uL (ref 150–400)
RBC: 4.45 MIL/uL (ref 4.22–5.81)
RDW: 13.6 % (ref 11.5–15.5)
WBC: 10.4 10*3/uL (ref 4.0–10.5)
nRBC: 0 % (ref 0.0–0.2)

## 2021-04-20 LAB — COMPREHENSIVE METABOLIC PANEL
ALT: 16 U/L (ref 0–44)
AST: 13 U/L — ABNORMAL LOW (ref 15–41)
Albumin: 3.7 g/dL (ref 3.5–5.0)
Alkaline Phosphatase: 50 U/L (ref 38–126)
Anion gap: 7 (ref 5–15)
BUN: 19 mg/dL (ref 8–23)
CO2: 23 mmol/L (ref 22–32)
Calcium: 8.8 mg/dL — ABNORMAL LOW (ref 8.9–10.3)
Chloride: 107 mmol/L (ref 98–111)
Creatinine, Ser: 0.95 mg/dL (ref 0.61–1.24)
GFR, Estimated: >60 mL/min (ref >60)
Glucose, Bld: 97 mg/dL (ref 70–99)
Potassium: 4.1 mmol/L (ref 3.5–5.1)
Sodium: 137 mmol/L (ref 135–145)
Total Bilirubin: 0.8 mg/dL (ref 0.3–1.2)
Total Protein: 7.1 g/dL (ref 6.5–8.1)

## 2021-04-20 LAB — URINE CULTURE

## 2021-04-20 MED ORDER — METOPROLOL SUCCINATE ER 25 MG PO TB24: 25.0000 mg | ORAL_TABLET | Freq: Every day | ORAL | Status: DC

## 2021-04-20 MED ORDER — FLUTICASONE PROPIONATE 50 MCG/ACT NA SUSP
1.0000 | Freq: Every day | NASAL | Status: DC
  Administered 2021-04-20 – 2021-04-21 (×2): 1 via NASAL
  Filled 2021-04-20: qty 16

## 2021-04-20 MED ORDER — TRAZODONE HCL 50 MG PO TABS
50.0000 mg | ORAL_TABLET | Freq: Once | ORAL | Status: AC
  Administered 2021-04-20: 50 mg via ORAL

## 2021-04-20 MED ORDER — METOPROLOL SUCCINATE ER 25 MG PO TB24
25.0000 mg | ORAL_TABLET | Freq: Every day | ORAL | Status: DC
  Administered 2021-04-21: 25 mg via ORAL
  Filled 2021-04-20 (×2): qty 1

## 2021-04-20 MED ORDER — ROPINIROLE HCL 0.25 MG PO TABS
1.0000 mg | ORAL_TABLET | Freq: Two times a day (BID) | ORAL | Status: DC
  Administered 2021-04-20 – 2021-04-21 (×3): 1 mg via ORAL
  Filled 2021-04-20 (×3): qty 4

## 2021-04-20 MED ORDER — ROPINIROLE HCL 0.25 MG PO TABS
1.0000 mg | ORAL_TABLET | Freq: Once | ORAL | Status: AC
  Administered 2021-04-20: 1 mg via ORAL
  Filled 2021-04-20: qty 4

## 2021-04-20 MED ORDER — TRAZODONE HCL 50 MG PO TABS
50.0000 mg | ORAL_TABLET | Freq: Every day | ORAL | Status: DC
  Administered 2021-04-20: 50 mg via ORAL
  Filled 2021-04-20: qty 1

## 2021-04-20 NOTE — Progress Notes (Signed)
°  Progress Note   Patient: Carlos Short. [id number] DOB: [birth year] DOA: 04/18/2021     1 DOS: the patient was seen and examined on 04/20/2021   Brief hospital course: No notes on file  Assessment and Plan: * UTI (urinary tract infection)- (present on admission) -Clinically improving -Urine culture has been re-sent. Will f/u on culture results -Currently continued on rocephin  Chronic anticoagulation -Cont on eliquis per home med -No evidence of acute blood loss  HTN (hypertension) -BP stable and controlled at this time -Cont current regimen  HFrEF (heart failure with reduced ejection fraction) (HCC) -Stable -Appears euvolemic  Urinary retention -Urology recs for foley while in hospital and self cath TID upon d/c  Bladder calculus -Urology following -cystolitholapaxy rescheduled for 05/03/21 -Per Urology, recommendation to cont foley and upon discharge, resume CIC TID -F/u on urine cultures  Nephrolithiasis -Urology following, plan per above  PAF (paroxysmal atrial fibrillation) (HCC)- (present on admission) -currently rate controlled -Currently on dronedarone -will resume metoprolol per home regimen with hold parameters  AAA (abdominal aortic aneurysm)- (present on admission) -imaging reviewed. Unchanged, stable 4.6cm infrarenal AAA noted -Stable  Acute metabolic encephalopathy- (present on admission) -mentation seems stable and baseline        Subjective: Reports feeling better  Physical Exam: Vitals:   04/20/21 0019 04/20/21 0417 04/20/21 0845 04/20/21 1332  BP: 135/86 124/88 129/84 134/86  Pulse: 63 (!) 55 63 (!) 54  Resp: (!) 22 18 16 20   Temp: 98.5 F (36.9 C) (!) 97.5 F (36.4 C) 97.9 F (36.6 C) 99.3 F (37.4 C)  TempSrc: Oral Oral Oral Oral  SpO2: 90% 100% 98% 97%  Weight:      Height:       General exam: Awake, laying in bed, in nad Respiratory system: Normal respiratory effort, no wheezing Cardiovascular system: regular  rate, s1, s2 Gastrointestinal system: Soft, nondistended, positive BS Central nervous system: CN2-12 grossly intact, strength intact Extremities: Perfused, no clubbing Skin: Normal skin turgor, no notable skin lesions seen Psychiatry: Mood normal // no visual hallucinations   Data Reviewed:  Labs reviewed  Family Communication: Pt in room, family at bedside  Disposition: Status is: Inpatient Remains inpatient appropriate because: Severity of illness   Planned Discharge Destination: Home      Author: , MD 04/20/2021 3:46 PM  For on call review [email or web address].

## 2021-04-20 NOTE — Assessment & Plan Note (Signed)
-  BP stable and controlled at this time -Cont current regimen

## 2021-04-20 NOTE — Assessment & Plan Note (Addendum)
-  Urology following -cystolitholapaxy rescheduled for 05/03/21 -Per Urology, recommendation to cont foley and upon discharge, resume CIC TID -Urine cultures inconclusive. Discussed with Urology who recommends ceftin bid x 2 weeks on d/c

## 2021-04-20 NOTE — Assessment & Plan Note (Addendum)
-  currently rate controlled -Currently on dronedarone -continued home metoprolol

## 2021-04-20 NOTE — Progress Notes (Signed)
°   04/20/21 1200  Mobility  Activity Ambulated with assistance in hallway  Level of Assistance Standby assist, set-up cues, supervision of patient - no hands on  Assistive Device Front wheel walker  Distance Ambulated (ft) 160 ft  Activity Response Tolerated well  $Mobility charge 1 Mobility   Pt agreeable to mobilize this morning. Ambulated about 17ft in hall with RW, tolerated well. No complaints. Left pt in bed, call bell at side. RN/NT notified of session.   Timoteo [employer] Mobility Specialist Acute Rehab Services Office: [phone]

## 2021-04-20 NOTE — Assessment & Plan Note (Addendum)
-  Clinically improving -ceftin x 2 weeks per above on d/c

## 2021-04-20 NOTE — Progress Notes (Signed)
°  Subjective: Carlos Short is doing better today. No acute complaints  Objective: Vital signs in last 24 hours: Temp:  [97.5 F (36.4 C)-98.9 F (37.2 C)] 97.9 F (36.6 C) (02/08 0845) Pulse Rate:  [49-72] 63 (02/08 0845) Resp:  [16-22] 16 (02/08 0845) BP: (123-148)/(74-88) 129/84 (02/08 0845) SpO2:  [90 %-100 %] 98 % (02/08 0845) Weight:  [90.1 kg] 90.1 kg (02/07 1615)  Intake/Output from previous day: 02/07 0701 - 02/08 0700 In: 0  Out: 3750 [Urine:3750] Intake/Output this shift: No intake/output data recorded.  Physical Exam:  General: Alert and oriented CV: RRR Lungs: Clear Abdomen: Soft, ND, ATTP Ext: NT, No erythema Foley in place draining clear/yellow urine  Lab Results: Recent Labs    04/19/21 0045 04/20/21 0510  HGB 14.3 14.1  HCT 42.2 42.7   BMET Recent Labs    04/19/21 0045 04/20/21 0510  NA 137 137  K 4.3 4.1  CL 107 107  CO2 24 23  GLUCOSE 95 97  BUN 24* 19  CREATININE 1.36* 0.95  CALCIUM 9.0 8.8*     Studies/Results: DG Chest 2 View  Result Date: 04/18/2021 CLINICAL DATA:  Concern for sepsis.  Fever. EXAM: CHEST - 2 VIEW COMPARISON:  Chest x-ray 03/31/2021. FINDINGS: The heart size and mediastinal contours are within normal limits. Both lungs are clear. The visualized skeletal structures are unremarkable. IMPRESSION: No active cardiopulmonary disease. Electronically Signed   By: Ronney Asters M.D.   On: 04/18/2021 22:53   CT Renal Stone Study  Result Date: 04/19/2021 CLINICAL DATA:  Flank pain, kidney stone suspected EXAM: CT ABDOMEN AND PELVIS WITHOUT CONTRAST TECHNIQUE: Multidetector CT imaging of the abdomen and pelvis was performed following the standard protocol without IV contrast. RADIATION DOSE REDUCTION: This exam was performed according to the departmental dose-optimization program which includes automated exposure control, adjustment of the mA and/or kV according to patient size and/or use of iterative reconstruction technique.  COMPARISON:  04/01/2021 FINDINGS: Lower chest: Small hiatal hernia.  No acute abnormality. Hepatobiliary: Small hypodensity in the right hepatic lobe likely small cyst measuring 10 mm. No suspicious focal hepatic abnormality. Gallbladder unremarkable. Pancreas: No focal abnormality or ductal dilatation. Spleen: Small scattered calcifications compatible with old granulomatous disease. Normal size. Adrenals/Urinary Tract: Bladder stone layering dependently measures 19 mm. Left renal parapelvic cysts. Multiple left renal stones, nonobstructing. The largest measures 10 mm in the midpole. No stones or hydronephrosis on the right. Adrenal glands unremarkable. Stomach/Bowel: Sigmoid diverticulosis. No active diverticulitis. Stomach and small bowel decompressed, unremarkable. Vascular/Lymphatic: Aortic aneurysm measuring 4.6 cm, unchanged since prior study. Aortic atherosclerosis. No adenopathy. Reproductive: No visible focal abnormality. Other: No free fluid or free air. Musculoskeletal: Prior right hip replacement. No acute bony abnormality. IMPRESSION: Left nephrolithiasis.  No ureteral stones or hydronephrosis. Left renal parapelvic cysts, thought to represent hydronephrosis on prior study. 19 mm bladder stone. 4.6 cm infrarenal abdominal aortic aneurysm. Sigmoid diverticulosis. Small hiatal hernia. Electronically Signed   By: Rolm Baptise M.D.   On: 04/19/2021 02:47    Assessment/Plan: 71 yo M with history of incomplete bladder emptying, bladder stone admitted with likely UTI. Doing better today -continue foley, upon discharge can resume CIC TID. -urine sample re-collected this AM; f/u cultures -cystolitholapaxy rescheduled for 2/21   LOS: 1 day   Donald Pore MD 04/20/2021, 11:04 AM [facility]  Pager: [phone]

## 2021-04-20 NOTE — Assessment & Plan Note (Signed)
-  Cont on eliquis per home med -No evidence of acute blood loss

## 2021-04-20 NOTE — Plan of Care (Signed)
  Problem: Activity: Goal: Risk for activity intolerance will decrease Outcome: Progressing   Problem: Nutrition: Goal: Adequate nutrition will be maintained Outcome: Progressing   Problem: Pain Managment: Goal: General experience of comfort will improve Outcome: Progressing   

## 2021-04-20 NOTE — Assessment & Plan Note (Signed)
-  Urology following, plan per above

## 2021-04-20 NOTE — Assessment & Plan Note (Signed)
-  Urology recs for foley while in hospital and self cath TID upon d/c

## 2021-04-20 NOTE — Evaluation (Signed)
Physical Therapy Evaluation Patient Details Name: Carlos Short. MRN: [id number] DOB: 1950/10/09 Today's Date: 04/20/2021  History of Present Illness  Pt is 71 yo male admitted on 04/18/21 with confusion and fever - found to have UTI. Of note pt with scheduled cystoscopy and bladder stone removal for 04/18/21 but moved to 05/03/21.   Pt with hx including but not limited to HFrEF, a fib on eliquis, HTN, HLD, mood disorder (bipolar vs schizophrenia), R THA.  Clinical Impression  Pt admitted with above diagnosis.  At baseline, he is independent.  Today, pt presenting with normal gait, balance, and strength.  He does have some confusion and decreased safety awareness particularly associated with lines/leads - suspect related to UTI.  Pt does not require skilled PT services. Recommend continued ambulation with mobility specialist or nursing staff.        Recommendations for follow up therapy are one component of a multi-disciplinary discharge planning process, led by the attending physician.  Recommendations may be updated based on patient status, additional functional criteria and insurance authorization.  Follow Up Recommendations No PT follow up    Assistance Recommended at Discharge Frequent or constant Supervision/Assistance (due to cognition)  Patient can return home with the following  Assistance with cooking/housework;Direct supervision/assist for financial management    Equipment Recommendations None recommended by PT  Recommendations for Other Services       Functional Status Assessment Patient has not had a recent decline in their functional status     Precautions / Restrictions Precautions Precautions: Fall      Mobility  Bed Mobility Overal bed mobility: Needs Assistance Bed Mobility: Supine to Sit, Sit to Supine     Supine to sit: Supervision Sit to supine: Supervision   General bed mobility comments: for lines    Transfers Overall transfer level: Needs  assistance Equipment used: None Transfers: Sit to/from Stand Sit to Stand: Supervision                Ambulation/Gait Ambulation/Gait assistance: Supervision Gait Distance (Feet): 300 Feet Assistive device: None Gait Pattern/deviations: Step-through pattern Gait velocity: normal     General Gait Details: Near normal gait pattern; cues for safety with lines/waiting for therapist  Stairs            Wheelchair Mobility    Modified Rankin (Stroke Patients Only)       Balance Overall balance assessment: Needs assistance Sitting-balance support: No upper extremity supported Sitting balance-Leahy Scale: Normal     Standing balance support: No upper extremity supported Standing balance-Leahy Scale: Good                               Pertinent Vitals/Pain Pain Assessment Pain Assessment: No/denies pain    Home Living Family/patient expects to be discharged to:: Private residence Living Arrangements: Spouse/significant other Available Help at Discharge: Family;Available 24 hours/day (wife works from home) Type of Home: House Home Access: Stairs to enter Entrance Stairs-Rails: Psychiatric nurse of Steps: 4 Alternate Level Stairs-Number of Steps: 1 flight Home Layout: Two level [place]: Crutches;Cane - single point      Prior Function Prior Level of Function : Independent/Modified Independent                     Hand Dominance        Extremity/Trunk Assessment   Upper Extremity Assessment Upper Extremity Assessment: Overall WFL for tasks assessed  Lower Extremity Assessment Lower Extremity Assessment: Overall WFL for tasks assessed    Cervical / Trunk Assessment Cervical / Trunk Assessment: Normal  Communication   Communication: No difficulties  Cognition Arousal/Alertness: Awake/alert Behavior During Therapy: WFL for tasks assessed/performed Overall Cognitive Status: Impaired/Different from  baseline Area of Impairment: Orientation, Attention, Memory, Safety/judgement, Problem solving                 Orientation Level: Situation Current Attention Level: Selective Memory: Decreased short-term memory   Safety/Judgement: Decreased awareness of safety (with lines)     General Comments: Pt with some confusion/impulsiveness particularly associated with lines/leads        General Comments      Exercises     Assessment/Plan    PT Assessment Patient does not need any further PT services  PT Problem List         PT Treatment Interventions      PT Goals (Current goals can be found in the Care Plan section)  Acute Rehab PT Goals Patient Stated Goal: return home PT Goal Formulation: All assessment and education complete, DC therapy    Frequency       Co-evaluation               AM-PAC PT "6 Clicks" Mobility  Outcome Measure Help needed turning from your back to your side while in a flat bed without using bedrails?: None Help needed moving from lying on your back to sitting on the side of a flat bed without using bedrails?: None Help needed moving to and from a bed to a chair (including a wheelchair)?: A Little Help needed standing up from a chair using your arms (e.g., wheelchair or bedside chair)?: A Little Help needed to walk in hospital room?: A Little Help needed climbing 3-5 steps with a railing? : A Little 6 Click Score: 20    End of Session   Activity Tolerance: Patient tolerated treatment well Patient left: in bed;with call bell/phone within reach;with bed alarm set;with family/visitor present Nurse Communication: Mobility status PT Visit Diagnosis: Other abnormalities of gait and mobility (R26.89)    Time: [id number] PT Time Calculation (min) (ACUTE ONLY): 16 min   Charges:   PT Evaluation $PT Eval Low Complexity: 1 Low          Sora Olivo, PT Acute Rehab Services Pager [phone] [facility] [phone]   Karlton Lemon 04/20/2021, 5:36 PM

## 2021-04-20 NOTE — Assessment & Plan Note (Signed)
Stable. Appears euvolemic. 

## 2021-04-20 NOTE — Assessment & Plan Note (Signed)
-  mentation seems stable and baseline

## 2021-04-20 NOTE — Assessment & Plan Note (Signed)
-  imaging reviewed. 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

## 2021-04-21 ENCOUNTER — Other Ambulatory Visit (HOSPITAL_COMMUNITY): Payer: Self-pay

## 2021-04-21 DIAGNOSIS — G9341 Metabolic encephalopathy: Secondary | ICD-10-CM

## 2021-04-21 LAB — URINE CULTURE

## 2021-04-21 MED ORDER — CEFUROXIME AXETIL 500 MG PO TABS
500.0000 mg | ORAL_TABLET | Freq: Two times a day (BID) | ORAL | 0 refills | Status: DC
  Filled 2021-04-21: qty 28, 14d supply, fill #0

## 2021-04-21 NOTE — Clinical Social Work Note (Signed)
°  Transition of Care [facility]) Screening Note   Patient Details  Name: Carlos Short. Date of Birth: 02-24-1951   Transition of [facility]) CM/SW Contact:    Trish Mage, LCSW Phone Number: 04/21/2021, 9:01 AM    Transition of Care Department [facility]) has reviewed patient and no TOC needs have been identified at this time. We will continue to monitor patient advancement through interdisciplinary progression rounds. If new patient transition needs arise, please place a TOC consult.

## 2021-04-21 NOTE — Progress Notes (Signed)
°   04/21/21 1100  Mobility  Activity Ambulated with assistance in hallway  Level of Assistance Modified independent, requires aide device or extra time  Assistive Device Other (Comment) (IV pole)  Distance Ambulated (ft) 300 ft  Activity Response Tolerated well  $Mobility charge 1 Mobility   Pt agreeable to mobilize this morning. Ambulated about 379ft in hall with IV pole, tolerated well. Pt stated he was having 7/10 lower back pain due to a previous vertebral injury, otherwise no complaints. Left pt in bed, call bell at side. RN/NT notified of session.    [facility] [employer] Mobility Specialist Acute Rehab Services Office: [phone]

## 2021-04-21 NOTE — Progress Notes (Signed)
Pt being d/c.  PIV removed by pt...  [facility]oley d/c Discharge information given.... Meds were p/u by wife at [facility].  Pt verb understanding and had no further questions

## 2021-04-21 NOTE — Discharge Summary (Signed)
Physician Discharge Summary   Patient: Carlos Short. MRN: [id number] DOB: June 30, 1950  Admit date:     04/18/2021  Discharge date: 04/21/21  Discharge Physician: Rickey Barbara   PCP: Barbie Banner, MD   Recommendations at discharge:    Follow up with PCP in 1-2 weeks Follow up with Urology as scheduled  Discharge Diagnoses: Principal Problem:   UTI (urinary tract infection) Active Problems:   Acute metabolic encephalopathy   AAA (abdominal aortic aneurysm)   PAF (paroxysmal atrial fibrillation) (HCC)   Nephrolithiasis   Bladder calculus   Urinary retention   HFrEF (heart failure with reduced ejection fraction) (HCC)   HTN (hypertension)   Chronic anticoagulation  Resolved Problems:   * No resolved hospital problems. *   Hospital Course: No notes on file  Assessment and Plan: * UTI (urinary tract infection)- (present on admission) -Clinically improving -ceftin x 2 weeks per above on d/c  Chronic anticoagulation -Cont on eliquis per home med -No evidence of acute blood loss  HTN (hypertension) -BP stable and controlled at this time -Cont current regimen  HFrEF (heart failure with reduced ejection fraction) (HCC) -Stable -Appears euvolemic  Urinary retention -Urology recs for foley while in hospital and self cath TID upon d/c  Bladder calculus -Urology following -cystolitholapaxy rescheduled for 05/03/21 -Per Urology, recommendation to cont foley and upon discharge, resume CIC TID -Urine cultures inconclusive. Discussed with Urology who recommends ceftin bid x 2 weeks on d/c  Nephrolithiasis -Urology following, plan per above  PAF (paroxysmal atrial fibrillation) (HCC)- (present on admission) -currently rate controlled -Currently on dronedarone -continued home metoprolol   AAA (abdominal aortic aneurysm)- (present on admission) -imaging reviewed. Unchanged, stable 4.6cm infrarenal AAA noted -Stable  Acute metabolic encephalopathy- (present  on admission) -mentation seems stable and baseline           Consultants: Urology Procedures performed:   Disposition: Home Diet recommendation:  Regular diet  DISCHARGE MEDICATION: Allergies as of 04/21/2021       Reactions   Gabapentin Other (See Comments)   crazy   Tylenol [acetaminophen] Other (See Comments)   Hurts his kidneys        Medication List     STOP taking these medications    ibuprofen 100 MG tablet Commonly known as: ADVIL       TAKE these medications    apixaban 5 MG Tabs tablet Commonly known as: ELIQUIS Take 1 tablet (5 mg total) by mouth 2 (two) times daily.   B COMPLEX PO Take 1 Dose by mouth daily. liquid   cefUROXime 500 MG tablet Commonly known as: CEFTIN Take 1 tablet (500 mg total) by mouth 2 (two) times daily with a meal for 14 days. Start taking on: April 22, 2021   doxazosin 8 MG tablet Commonly known as: CARDURA Take 1 tablet (8 mg total) by mouth daily. What changed: when to take this   dronedarone 400 MG tablet Commonly known as: MULTAQ Take 400 mg by mouth 2 (two) times daily with a meal.   fluticasone 50 MCG/ACT nasal spray Commonly known as: FLONASE Place 1 spray into both nostrils daily as needed for allergies.   ibandronate 150 MG tablet Commonly known as: BONIVA Take 150 mg by mouth every 30 (thirty) days. 3rd of each month   loratadine 10 MG tablet Commonly known as: CLARITIN Take 10 mg by mouth daily as needed (runny nose).   metoprolol succinate 25 MG 24 hr tablet Commonly known as: TOPROL-XL Take 1  tablet (25 mg total) by mouth daily. Take with or immediately following a meal. What changed: when to take this   rOPINIRole 1 MG tablet Commonly known as: REQUIP Take 1 mg by mouth 2 (two) times daily.   traMADol 50 MG tablet Commonly known as: ULTRAM Take 50-100 mg by mouth 3 (three) times daily as needed (pain (max 3 tablets/24 hours)).   traZODone 100 MG tablet Commonly known as:  DESYREL Take 50 mg by mouth at bedtime.   vitamin C 1000 MG tablet Take 1,000 mg by mouth daily.        Follow-up Information     Barbie Banner, MD Follow up in 2 week(s).   Specialty: Family Medicine Why: Hospital follow up Contact information: [address] [place] [address] [phone]         Despina Arias, MD Follow up.   Specialty: Urology Why: follow up as scheduled Contact information: [address] [place] [address] [phone]         Mercie Eon, MD .   Specialty: Internal Medicine Contact information: [address] [place], [address] [place] [address] [phone]                 Discharge Exam: Ceasar Mons Weights   04/19/21 0111 04/19/21 1615  Weight: 88.9 kg 90.1 kg   General exam: Awake, laying in bed, in nad Respiratory system: Normal respiratory effort, no wheezing Cardiovascular system: regular rate, s1, s2 Gastrointestinal system: Soft, nondistended, positive BS Central nervous system: CN2-12 grossly intact, strength intact Extremities: Perfused, no clubbing Skin: Normal skin turgor, no notable skin lesions seen Psychiatry: Mood normal // no visual hallucinations    Condition at discharge: good  The results of significant diagnostics from this hospitalization (including imaging, microbiology, ancillary and laboratory) are listed below for reference.   Imaging Studies: DG Chest 2 View  Result Date: 04/18/2021 CLINICAL DATA:  Concern for sepsis.  Fever. EXAM: CHEST - 2 VIEW COMPARISON:  Chest x-ray 03/31/2021. FINDINGS: The heart size and mediastinal contours are within normal limits. Both lungs are clear. The visualized skeletal structures are unremarkable. IMPRESSION: No active cardiopulmonary disease. Electronically Signed   By: Darliss Cheney M.D.   On: 04/18/2021 22:53   DG Chest 2 View  Result Date: 03/31/2021 CLINICAL DATA:  Fever. EXAM: CHEST - 2 VIEW COMPARISON:  12/31/2020. FINDINGS: The heart size  and mediastinal contours are within normal limits. Both lungs are clear. Degenerative changes are present in the thoracic spine. IMPRESSION: No active cardiopulmonary disease. Electronically Signed   By: Thornell Sartorius M.D.   On: 03/31/2021 23:34   CT Renal Stone Study  Result Date: 04/19/2021 CLINICAL DATA:  Flank pain, kidney stone suspected EXAM: CT ABDOMEN AND PELVIS WITHOUT CONTRAST TECHNIQUE: Multidetector CT imaging of the abdomen and pelvis was performed following the standard protocol without IV contrast. RADIATION DOSE REDUCTION: This exam was performed according to the departmental dose-optimization program which includes automated exposure control, adjustment of the mA and/or kV according to patient size and/or use of iterative reconstruction technique. COMPARISON:  04/01/2021 FINDINGS: Lower chest: Small hiatal hernia.  No acute abnormality. Hepatobiliary: Small hypodensity in the right hepatic lobe likely small cyst measuring 10 mm. No suspicious focal hepatic abnormality. Gallbladder unremarkable. Pancreas: No focal abnormality or ductal dilatation. Spleen: Small scattered calcifications compatible with old granulomatous disease. Normal size. Adrenals/Urinary Tract: Bladder stone layering dependently measures 19 mm. Left renal parapelvic cysts. Multiple left renal stones, nonobstructing.  The largest measures 10 mm in the midpole. No stones or hydronephrosis on the right. Adrenal glands unremarkable. Stomach/Bowel: Sigmoid diverticulosis. No active diverticulitis. Stomach and small bowel decompressed, unremarkable. Vascular/Lymphatic: Aortic aneurysm measuring 4.6 cm, unchanged since prior study. Aortic atherosclerosis. No adenopathy. Reproductive: No visible focal abnormality. Other: No free fluid or free air. Musculoskeletal: Prior right hip replacement. No acute bony abnormality. IMPRESSION: Left nephrolithiasis.  No ureteral stones or hydronephrosis. Left renal parapelvic cysts, thought to  represent hydronephrosis on prior study. 19 mm bladder stone. 4.6 cm infrarenal abdominal aortic aneurysm. Sigmoid diverticulosis. Small hiatal hernia. Electronically Signed   By: Charlett Nose M.D.   On: 04/19/2021 02:47   CT RENAL STONE STUDY  Result Date: 04/01/2021 CLINICAL DATA:  Nephrolithiasis. EXAM: CT ABDOMEN AND PELVIS WITHOUT CONTRAST TECHNIQUE: Multidetector CT imaging of the abdomen and pelvis was performed following the standard protocol without IV contrast. RADIATION DOSE REDUCTION: This exam was performed according to the departmental dose-optimization program which includes automated exposure control, adjustment of the mA and/or kV according to patient size and/or use of iterative reconstruction technique. COMPARISON:  CT examination dated May 29, 2019 FINDINGS: Lower chest: No acute abnormality. Hepatobiliary: Stable hypodensity about the dome of the diaphragm and in the right hepatic lobe adjacent to the gallbladder fossa, likely cysts, unchanged. No gallstones, gallbladder wall thickening, or biliary dilatation. Pancreas: Moderate pancreatic atrophy. No pancreatic ductal dilatation or surrounding inflammatory changes. Spleen: Normal in size with punctate calcifications, likely sequela of prior granulomatous infection. Adrenals/Urinary Tract: Adrenal glands are unremarkable. There are multiple calculi in the left kidney the largest measuring up to 1.3 cm. There is moderate left hydronephrosis and left perinephric fat stranding. The left ureter is prominent with mild surrounding fat stranding. No appreciable calculus in the left ureter, however evaluation of distal left ureter is limited due to streak artifact from right hip arthroplasty. There is however a calculus in the lumen of the urinary bladder adjacent to the [facility] catheter balloon measuring approximatelyr 6 mm. There is another large calculus in the pelvis or in the lumen of the urinary bladder, evaluation is limited due to streak  artifact. No appreciable calculus in the right kidney. Right ureter is unremarkable. The urinary bladder is collapsed about the [facility] catheter limiting evaluation. Stomach/Bowel: Stomach is within normal limits. Appendix appears normal. No evidence of bowel wall thickening, distention, or inflammatory changes. Vascular/Lymphatic: Fusiform aneurysmal dilatation of the infrarenal abdominal aorta measuring up to 4.4 cm (series 2, image 39), which have increased in size since recent prior examination measuring 4 cm. Reproductive: Prostate is unremarkable. Other: Fat containing bilateral inguinal hernias. No abdominopelvic ascites. Musculoskeletal: Stable compression deformity of L1 vertebral body as well as prominent Schmorl node in the inferior endplate of the L2 vertebral body. Moderate multilevel degenerative disc disease. Diffuse osteopenia. Status post right hip arthroplasty. IMPRESSION: 1. Left hydronephrosis and hydroureter with left perinephric fat stranding without evidence of ureteral calculus and a small 6 mm calculus in the left urinary bladder lumen, findings likely represent recently passed calculus. 2. Evaluation of the urinary bladder is somewhat limited due to streak artifact from right hip arthroplasty. No right hydronephrosis or ureteral calculus. 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 3. 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 Continued short-term follow-up examination is recommended. 4.  Colonic diverticulosis without evidence of acute diverticulitis. 5. Compression deformity of L1  and L2 vertebral bodies, unchanged. Status post right hip arthroplasty with intact hardware. Electronically Signed   By: Larose HiresImran  Ahmed D.O.   On: 04/01/2021 16:01    Microbiology: Results for orders placed or performed during the hospital encounter of 04/18/21  Culture, blood (Routine  x 2)     Status: None (Preliminary result)   Collection Time: 04/19/21 12:45 AM   Specimen: BLOOD  Result Value Ref Range Status   Specimen Description   Final    BLOOD BLOOD LEFT FOREARM Performed at [facility], [address] [place], [place] [address]    Special Requests   Final    BOTTLES DRAWN AEROBIC AND ANAEROBIC Blood Culture adequate volume Performed at [facility], [address] [place], [place] [address]    Culture   Final    NO GROWTH 2 DAYS Performed at [facility], [address] [place], [place] [address]    Report Status PENDING  Incomplete  Culture, blood (Routine x 2)     Status: None (Preliminary result)   Collection Time: 04/19/21 12:45 AM   Specimen: BLOOD  Result Value Ref Range Status   Specimen Description   Final    BLOOD LEFT ANTECUBITAL Performed at [facility], [address] [place], [place] [address]    Special Requests   Final    BOTTLES DRAWN AEROBIC AND ANAEROBIC Blood Culture adequate volume Performed at [facility], [address] [place], [place] [address]    Culture   Final    NO GROWTH 2 DAYS Performed at [facility], [address] [place], [place] [address]    Report Status PENDING  Incomplete  Urine Culture     Status: Abnormal   Collection Time: 04/19/21 12:45 AM   Specimen: Urine, Clean Catch  Result Value Ref Range Status   Specimen Description   Final    URINE, CLEAN CATCH Performed at [facility], [address] [place], [place] [address]    Special Requests   Final    NONE Performed at [facility], [address] [place], [place] [address]    Culture MULTIPLE SPECIES PRESENT, SUGGEST RECOLLECTION (A)  Final   Report Status 04/20/2021 FINAL  Final  Resp Panel by RT-PCR (Flu A&B, Covid)     Status: None   Collection Time: 04/19/21 12:45 AM   Specimen: Nasopharyngeal(NP) swabs in  vial transport medium  Result Value Ref Range Status   SARS Coronavirus 2 by RT PCR NEGATIVE NEGATIVE Final    Comment: (NOTE) SARS-CoV-2 target nucleic acids are NOT DETECTED.  The SARS-CoV-2 RNA is generally detectable in upper respiratory specimens during the acute phase of infection. The lowest concentration of SARS-CoV-2 viral copies this assay can detect is 138 copies/mL. A negative result does not preclude SARS-Cov-2 infection and should not be used as the sole basis for treatment or other patient management decisions. A negative result may occur with  improper specimen collection/handling, submission of specimen other than nasopharyngeal swab, presence of viral mutation(s) within the areas targeted by this assay, and inadequate number of viral copies(<138 copies/mL). A negative result must be combined with clinical observations, patient history, and epidemiological information. The expected result is Negative.  Fact Sheet for Patients:  BloggerCourse.comhttps://www.fda.gov/media/152166/download  Fact Sheet for Healthcare Providers:  SeriousBroker.ithttps://www.fda.gov/media/152162/download  This test is no t yet approved or cleared by the [place] FDA and  has been authorized for detection  and/or diagnosis of SARS-CoV-2 by FDA under an Emergency Use Authorization (EUA). This EUA will remain  in effect (meaning this test can be used) for the duration of the COVID-19 declaration under Section 564(b)(1) of the Act, 21 U.S.C.section 360bbb-3(b)(1), unless the authorization is terminated  or revoked sooner.       Influenza A by PCR NEGATIVE NEGATIVE Final   Influenza B by PCR NEGATIVE NEGATIVE Final    Comment: (NOTE) The Xpert Xpress SARS-CoV-2/FLU/RSV plus assay is intended as an aid in the diagnosis of influenza from Nasopharyngeal swab specimens and should not be used as a sole basis for treatment. Nasal washings and aspirates are unacceptable for Xpert Xpress SARS-CoV-2/FLU/RSV testing.  Fact  Sheet for Patients: BloggerCourse.comhttps://www.fda.gov/media/152166/download  Fact Sheet for Healthcare Providers: SeriousBroker.ithttps://www.fda.gov/media/152162/download  This test is not yet approved or cleared by the [place] FDA and has been authorized for detection and/or diagnosis of SARS-CoV-2 by FDA under an Emergency Use Authorization (EUA). This EUA will remain in effect (meaning this test can be used) for the duration of the COVID-19 declaration under Section 564(b)(1) of the Act, 21 U.S.C. section 360bbb-3(b)(1), unless the authorization is terminated or revoked.  Performed at [facility], [address] [place], [place] [address]   Urine Culture     Status: Abnormal   Collection Time: 04/20/21 10:52 AM   Specimen: Urine, Catheterized  Result Value Ref Range Status   Specimen Description   Final    URINE, CATHETERIZED Performed at [facility], [address] [place], [place] [address]    Special Requests   Final    NONE Performed at [facility], [address] [place], [place] [address]    Culture MULTIPLE SPECIES PRESENT, SUGGEST RECOLLECTION (A)  Final   Report Status 04/21/2021 FINAL  Final    Labs: CBC: Recent Labs  Lab 04/19/21 0045 04/20/21 0510  WBC 14.2* 10.4  NEUTROABS 10.1*  --   HGB 14.3 14.1  HCT 42.2 42.7  MCV 94.8 96.0  PLT 194 175   Basic Metabolic Panel: Recent Labs  Lab 04/19/21 0045 04/20/21 0510  NA 137 137  K 4.3 4.1  CL 107 107  CO2 24 23  GLUCOSE 95 97  BUN 24* 19  CREATININE 1.36* 0.95  CALCIUM 9.0 8.8*   Liver Function Tests: Recent Labs  Lab 04/19/21 0045 04/20/21 0510  AST 16 13*  ALT 18 16  ALKPHOS 58 50  BILITOT 0.5 0.8  PROT 7.4 7.1  ALBUMIN 3.8 3.7   CBG: No results for input(s): GLUCAP in the last 168 hours.  Discharge time spent: less than 30 minutes.  Signed: Rickey BarbaraStephen Shamaya Kauer, MD [facility] 04/21/2021

## 2021-04-21 NOTE — Discharge Instructions (Signed)
Per Urology, perform self-cath's three times daily

## 2021-04-24 LAB — CULTURE, BLOOD (ROUTINE X 2)
Culture: NO GROWTH
Culture: NO GROWTH
Special Requests: ADEQUATE
Special Requests: ADEQUATE

## 2021-04-26 NOTE — Progress Notes (Signed)
Mrs. Carlos Short made aware to arrive at 11:30 AM on 05/03/21. Reviewed pre op instructions, verbalized understanding.  Last dose of Eliquis will be on 04/30/2021 per Mrs. Carlos Short.  No changes to medications since previous pre surgical testing appointment. However, since discharge from hospital patient is having a lot of runny nose not responding to Loratadine.  Also complaining of Shortness of breath and yawning.  [name] has a doctors appointment on 04/27/2021 at 9 AM to follow up about SOB. I have faxed a request for the office visit notes from this appointment.  Otherwise wife states he is getting is strength back from being hospitalized.

## 2021-05-03 ENCOUNTER — Ambulatory Visit (HOSPITAL_COMMUNITY)
Admission: RE | Admit: 2021-05-03 | Discharge: 2021-05-03 | Disposition: A | Discharge status: Home / Self Care | Payer: Medicare Other | Source: Ambulatory Visit | Attending: Urology | Admitting: Urology

## 2021-05-03 ENCOUNTER — Ambulatory Visit (HOSPITAL_BASED_OUTPATIENT_CLINIC_OR_DEPARTMENT_OTHER): Payer: Medicare Other | Admitting: Anesthesiology

## 2021-05-03 ENCOUNTER — Ambulatory Visit (HOSPITAL_COMMUNITY): Payer: Medicare Other | Admitting: Physician Assistant

## 2021-05-03 ENCOUNTER — Encounter (HOSPITAL_COMMUNITY): Payer: Self-pay | Admitting: Urology

## 2021-05-03 ENCOUNTER — Encounter (HOSPITAL_COMMUNITY)
Admission: RE | Disposition: A | Discharge status: Home / Self Care | Payer: Self-pay | Source: Ambulatory Visit | Attending: Urology

## 2021-05-03 DIAGNOSIS — F1290 Cannabis use, unspecified, uncomplicated: Secondary | ICD-10-CM | POA: Insufficient documentation

## 2021-05-03 DIAGNOSIS — G2581 Restless legs syndrome: Secondary | ICD-10-CM | POA: Diagnosis not present

## 2021-05-03 DIAGNOSIS — F1590 Other stimulant use, unspecified, uncomplicated: Secondary | ICD-10-CM | POA: Diagnosis not present

## 2021-05-03 DIAGNOSIS — I10 Essential (primary) hypertension: Secondary | ICD-10-CM | POA: Diagnosis not present

## 2021-05-03 DIAGNOSIS — N210 Calculus in bladder: Secondary | ICD-10-CM | POA: Diagnosis present

## 2021-05-03 DIAGNOSIS — R338 Other retention of urine: Secondary | ICD-10-CM

## 2021-05-03 DIAGNOSIS — N401 Enlarged prostate with lower urinary tract symptoms: Secondary | ICD-10-CM | POA: Insufficient documentation

## 2021-05-03 DIAGNOSIS — Z7901 Long term (current) use of anticoagulants: Secondary | ICD-10-CM | POA: Insufficient documentation

## 2021-05-03 DIAGNOSIS — F419 Anxiety disorder, unspecified: Secondary | ICD-10-CM | POA: Diagnosis not present

## 2021-05-03 DIAGNOSIS — Z79899 Other long term (current) drug therapy: Secondary | ICD-10-CM | POA: Diagnosis not present

## 2021-05-03 DIAGNOSIS — N390 Urinary tract infection, site not specified: Secondary | ICD-10-CM | POA: Diagnosis not present

## 2021-05-03 DIAGNOSIS — I4891 Unspecified atrial fibrillation: Secondary | ICD-10-CM | POA: Insufficient documentation

## 2021-05-03 DIAGNOSIS — F17210 Nicotine dependence, cigarettes, uncomplicated: Secondary | ICD-10-CM | POA: Diagnosis not present

## 2021-05-03 DIAGNOSIS — G4700 Insomnia, unspecified: Secondary | ICD-10-CM | POA: Diagnosis not present

## 2021-05-03 DIAGNOSIS — F1490 Cocaine use, unspecified, uncomplicated: Secondary | ICD-10-CM | POA: Insufficient documentation

## 2021-05-03 DIAGNOSIS — E785 Hyperlipidemia, unspecified: Secondary | ICD-10-CM | POA: Insufficient documentation

## 2021-05-03 DIAGNOSIS — F32A Depression, unspecified: Secondary | ICD-10-CM | POA: Insufficient documentation

## 2021-05-03 DIAGNOSIS — Z87440 Personal history of urinary (tract) infections: Secondary | ICD-10-CM | POA: Insufficient documentation

## 2021-05-03 DIAGNOSIS — R339 Retention of urine, unspecified: Secondary | ICD-10-CM

## 2021-05-03 HISTORY — PX: CYSTOSCOPY WITH LITHOLAPAXY: SHX1425

## 2021-05-03 SURGERY — CYSTOSCOPY, WITH BLADDER CALCULUS LITHOLAPAXY
Anesthesia: General | Site: Bladder

## 2021-05-03 MED ORDER — CHLORHEXIDINE GLUCONATE 0.12 % MT SOLN: 15.0000 mL | Freq: Once | OROMUCOSAL | Status: DC

## 2021-05-03 MED ORDER — GLYCOPYRROLATE 0.2 MG/ML IJ SOLN
INTRAMUSCULAR | Status: DC | PRN
  Administered 2021-05-03: .2 mg via INTRAVENOUS

## 2021-05-03 MED ORDER — ORAL CARE MOUTH RINSE: 15.0000 mL | Freq: Once | OROMUCOSAL | Status: DC

## 2021-05-03 MED ORDER — EPHEDRINE SULFATE-NACL 50-0.9 MG/10ML-% IV SOSY
PREFILLED_SYRINGE | INTRAVENOUS | Status: DC | PRN
  Administered 2021-05-03 (×5): 10 mg via INTRAVENOUS

## 2021-05-03 MED ORDER — PHENYLEPHRINE HCL-NACL 20-0.9 MG/250ML-% IV SOLN
INTRAVENOUS | Status: DC | PRN
  Administered 2021-05-03: 25 ug/min via INTRAVENOUS

## 2021-05-03 MED ORDER — MIDAZOLAM HCL 2 MG/2ML IJ SOLN
INTRAMUSCULAR | Status: AC
Start: 2021-05-03 — End: ?
  Filled 2021-05-03: qty 2

## 2021-05-03 MED ORDER — PHENYLEPHRINE HCL (PRESSORS) 10 MG/ML IV SOLN
INTRAVENOUS | Status: DC | PRN
  Administered 2021-05-03 (×2): 80 ug via INTRAVENOUS

## 2021-05-03 MED ORDER — SODIUM CHLORIDE 0.9 % IV SOLN
INTRAVENOUS | Status: AC
  Filled 2021-05-03: qty 20

## 2021-05-03 MED ORDER — CEFTRIAXONE SODIUM 2 G IJ SOLR
INTRAMUSCULAR | Status: AC
  Filled 2021-05-03: qty 20

## 2021-05-03 MED ORDER — SODIUM CHLORIDE 0.9 % IR SOLN
Status: DC | PRN
  Administered 2021-05-03 (×2): 3000 mL

## 2021-05-03 MED ORDER — CELECOXIB 100 MG PO CAPS: 100.0000 mg | ORAL_CAPSULE | Freq: Two times a day (BID) | ORAL | 1 refills | Status: AC | PRN

## 2021-05-03 MED ORDER — OXYBUTYNIN CHLORIDE 5 MG PO TABS: 5.0000 mg | ORAL_TABLET | Freq: Three times a day (TID) | ORAL | 1 refills | Status: DC | PRN

## 2021-05-03 MED ORDER — MIDAZOLAM HCL 5 MG/5ML IJ SOLN
INTRAMUSCULAR | Status: DC | PRN
Start: 2021-05-03 — End: 2021-05-03
  Administered 2021-05-03: 2 mg via INTRAVENOUS

## 2021-05-03 MED ORDER — DEXAMETHASONE SODIUM PHOSPHATE 4 MG/ML IJ SOLN
INTRAMUSCULAR | Status: DC | PRN
  Administered 2021-05-03: 10 mg via INTRAVENOUS

## 2021-05-03 MED ORDER — ONDANSETRON HCL 4 MG/2ML IJ SOLN: 4.0000 mg | Freq: Once | INTRAMUSCULAR | Status: DC | PRN

## 2021-05-03 MED ORDER — ONDANSETRON HCL 4 MG/2ML IJ SOLN
INTRAMUSCULAR | Status: DC | PRN
  Administered 2021-05-03: 4 mg via INTRAVENOUS

## 2021-05-03 MED ORDER — DEXTROSE 5 % IV SOLN
INTRAVENOUS | Status: DC | PRN
  Administered 2021-05-03: 2 g via INTRAVENOUS

## 2021-05-03 MED ORDER — CEFUROXIME AXETIL 500 MG PO TABS: 500.0000 mg | ORAL_TABLET | Freq: Two times a day (BID) | ORAL | 0 refills | Status: AC

## 2021-05-03 MED ORDER — TRAMADOL HCL 50 MG PO TABS: 50.0000 mg | ORAL_TABLET | Freq: Four times a day (QID) | ORAL | 0 refills | Status: DC | PRN

## 2021-05-03 MED ORDER — PROPOFOL 10 MG/ML IV BOLUS
INTRAVENOUS | Status: DC | PRN
  Administered 2021-05-03: 150 mg via INTRAVENOUS

## 2021-05-03 MED ORDER — FENTANYL CITRATE (PF) 100 MCG/2ML IJ SOLN
INTRAMUSCULAR | Status: DC | PRN
  Administered 2021-05-03 (×2): 50 ug via INTRAVENOUS

## 2021-05-03 MED ORDER — VASOPRESSIN 20 UNIT/ML IV SOLN
INTRAVENOUS | Status: DC | PRN
  Administered 2021-05-03 (×2): 2 [IU] via INTRAVENOUS
  Administered 2021-05-03: 1 [IU] via INTRAVENOUS

## 2021-05-03 MED ORDER — FENTANYL CITRATE PF 50 MCG/ML IJ SOSY: 25.0000 ug | PREFILLED_SYRINGE | INTRAMUSCULAR | Status: DC | PRN

## 2021-05-03 MED ORDER — FENTANYL CITRATE (PF) 100 MCG/2ML IJ SOLN
INTRAMUSCULAR | Status: AC
  Filled 2021-05-03: qty 2

## 2021-05-03 MED ORDER — VASOPRESSIN 20 UNIT/ML IV SOLN
INTRAVENOUS | Status: AC
  Filled 2021-05-03: qty 1

## 2021-05-03 MED ORDER — LIDOCAINE HCL (CARDIAC) PF 100 MG/5ML IV SOSY
PREFILLED_SYRINGE | INTRAVENOUS | Status: DC | PRN
  Administered 2021-05-03: 60 mg via INTRAVENOUS

## 2021-05-03 MED ORDER — LACTATED RINGERS IV SOLN
INTRAVENOUS | Status: DC
Start: 2021-05-03 — End: 2021-05-03

## 2021-05-03 SURGICAL SUPPLY — 15 items
BAG DRN RND TRDRP ANRFLXCHMBR (UROLOGICAL SUPPLIES) ×1
BAG URINE DRAIN 2000ML AR STRL (UROLOGICAL SUPPLIES) ×1 IMPLANT
BAG URO CATCHER STRL LF (MISCELLANEOUS) ×2 IMPLANT
CATH TIEMANN FOLEY 18FR 5CC (CATHETERS) ×1 IMPLANT
CLOTH BEACON ORANGE TIMEOUT ST (SAFETY) ×2 IMPLANT
GLOVE SURG ENC TEXT LTX SZ7.5 (GLOVE) ×2 IMPLANT
GOWN STRL REUS W/TWL XL LVL3 (GOWN DISPOSABLE) ×4 IMPLANT
LASER FIB FLEXIVA PULSE ID 550 (Laser) ×2 IMPLANT
LASER FIB FLEXIVA PULSE ID 910 (Laser) ×1 IMPLANT
MANIFOLD NEPTUNE II (INSTRUMENTS) ×2 IMPLANT
PACK CYSTO (CUSTOM PROCEDURE TRAY) ×2 IMPLANT
SYR TOOMEY IRRIG 70ML (MISCELLANEOUS)
SYRINGE TOOMEY IRRIG 70ML (MISCELLANEOUS) IMPLANT
TUBING CONNECTING 10 (TUBING) IMPLANT
TUBING UROLOGY SET (TUBING) ×2 IMPLANT

## 2021-05-03 NOTE — Anesthesia Procedure Notes (Signed)
Procedure Name: LMA Insertion Date/Time: 05/03/2021 2:00 PM Performed by: Lavina Hamman, CRNA Pre-anesthesia Checklist: Patient identified, Emergency Drugs available, Suction available and Patient being monitored Patient Re-evaluated:Patient Re-evaluated prior to induction Oxygen Delivery Method: Circle System Utilized Preoxygenation: Pre-oxygenation with 100% oxygen Induction Type: IV induction Ventilation: Mask ventilation without difficulty LMA: LMA inserted LMA Size: 5.0 Number of attempts: 1 Airway Equipment and Method: Bite block Placement Confirmation: positive ETCO2 Tube secured with: Tape Dental Injury: Teeth and Oropharynx as per pre-operative assessment

## 2021-05-03 NOTE — Interval H&P Note (Signed)
History and Physical Interval Note:  05/03/2021 12:39 PM  Carlos Short.  has presented today for surgery, with the diagnosis of BLADDER STONE.  The various methods of treatment have been discussed with the patient and family. After consideration of risks, benefits and other options for treatment, the patient has consented to  Procedure(s): CYSTOSCOPY WITH LITHOLAPAXY (N/A) as a surgical intervention.  The patient's history has been reviewed, patient examined, no change in status, stable for surgery.  I have reviewed the patient's chart and labs.  Questions were answered to the patient's satisfaction.     Kellene Mccleary L Tyaisha Cullom

## 2021-05-03 NOTE — Op Note (Signed)
Preoperative diagnosis:  1. Bladder stone 2. Urinary retention 3. Enlarged prostate 4. Recent UTI  Postoperative diagnosis: same  Procedure(s): 1. Cystolithalopaxy 2. cystoscopy  Surgeon: Dr. Irine Seal  Anesthesia: general  Complications: none  EBL: <2 cc  Specimens: Bladder stone  Disposition of specimens: to clinic to send for analysis  Intraoperative findings: bladder stone, moderate bilobar enlargement of prostate   Indication: Mr Carlos Short is a 71 yo M with a history of bladder stone. Options were discussed for intervention and he elected to proceed with cystolitholapaxy   Description of procedure:  With appropriate consent having been obtained the patient was brought to the operative suite.  Anesthesia was induced and the patient was positioned in dorsolithotomy position.  He was then prepped and draped in the usual sterile fashion.  A timeout was performed.  I began by carefully inserting the cystoscope through the urethra into the bladder.  The urethra was notable for what appeared to be a healed false passage in the bulbar urethra.  Additionally there was bilobar enlargement of his prostate.  Once in the bladder, the bladder was completely examined.  Each ureteral orifice was identified and unremarkable.  There was mild to moderate trabeculation present.  The stone was easily identified in the dependent portion of the bladder.  The laser fiber was then inserted and used to completely fragment the stone.  The stone fragments were then evacuated out through the cystoscope.  This was repeated until a small amount of dust was left in the bladder.  We carefully withdrew the scope and inserted an 49 French coud catheter into the bladder.  The balloon was filled with 10 cc of sterile water and it was connected to the appropriate collection bag.  [name] tolerated the procedure well and was brought to the PACU in stable condition  Plan: [name] will follow-up in the  nursing procedure books in 3 days for catheter removal.  After that he will resume CIC and follow-up with me as planned March 6.  Irine Seal MD 05/03/2021, 2:27 PM  [facility]  Pager: [phone]

## 2021-05-03 NOTE — Anesthesia Preprocedure Evaluation (Addendum)
Anesthesia Evaluation  Patient identified by MRN, date of birth, ID band Patient awake    Reviewed: Allergy & Precautions, NPO status , Patient's Chart, lab work & pertinent test results, reviewed documented beta blocker date and time   Airway Mallampati: III  TM Distance: >3 FB Neck ROM: Full    Dental  (+) Partial Lower, Partial Upper, Poor Dentition, Missing, Dental Advisory Given, Chipped   Pulmonary Current Smoker,  Current smoker, 13 pack year history  3-5 cigg/d No inhalers   Pulmonary exam normal breath sounds clear to auscultation       Cardiovascular hypertension, Pt. on medications and Pt. on home beta blockers Normal cardiovascular exam+ dysrhythmias (eliquis LD 72h ago) Atrial Fibrillation  Rhythm:Regular Rate:Normal     Neuro/Psych PSYCHIATRIC DISORDERS Anxiety Depression  Neuromuscular disease (RLS, RLE peripheral neuropathy )    GI/Hepatic negative GI ROS, (+)     substance abuse  cocaine use, marijuana use and methamphetamine use, Remote hx drug abuse, nothing currently    Endo/Other  negative endocrine ROS  Renal/GU Renal disease Bladder dysfunction (bladder stone)      Musculoskeletal negative musculoskeletal ROS (+)   Abdominal   Peds  Hematology negative hematology ROS (+) hct 42.7, plt 175   Anesthesia Other Findings   Reproductive/Obstetrics negative OB ROS                            Anesthesia Physical Anesthesia Plan  ASA: 3  Anesthesia Plan: General   Post-op Pain Management: Tylenol PO (pre-op)*   Induction: Intravenous  PONV Risk Score and Plan: 1 and Ondansetron, Dexamethasone, Midazolam and Treatment may vary due to age or medical condition  Airway Management Planned: LMA  Additional Equipment: None  Intra-op Plan:   Post-operative Plan: Extubation in OR  Informed Consent: I have reviewed the patients History and Physical, chart, labs and  discussed the procedure including the risks, benefits and alternatives for the proposed anesthesia with the patient or authorized representative who has indicated his/her understanding and acceptance.     Dental advisory given  Plan Discussed with: CRNA  Anesthesia Plan Comments:        Anesthesia Quick Evaluation

## 2021-05-03 NOTE — Anesthesia Postprocedure Evaluation (Signed)
Anesthesia Post Note  Patient: Carlos Short.  Procedure(s) Performed: CYSTOSCOPY WITH LITHOLAPAXY holmium laser (Bladder)     Patient location during evaluation: PACU Anesthesia Type: General Level of consciousness: awake and alert, oriented and patient cooperative Pain management: pain level controlled Vital Signs Assessment: post-procedure vital signs reviewed and stable Respiratory status: spontaneous breathing, nonlabored ventilation and respiratory function stable Cardiovascular status: blood pressure returned to baseline and stable Postop Assessment: no apparent nausea or vomiting Anesthetic complications: no   No notable events documented.  Last Vitals:  Vitals:   05/03/21 1213 05/03/21 1436  BP: 126/71   Pulse: (!) 53   Resp: 18   Temp: 36.7 C 36.5 C  SpO2: 99%     Last Pain:  Vitals:   05/03/21 1436  TempSrc:   PainSc: 0-No pain                 Pervis Hocking

## 2021-05-03 NOTE — Discharge Instructions (Addendum)
CYSTOSCOPY HOME CARE INSTRUCTIONS  **resume taking your eliquis on 2/23  Activity: Rest for the remainder of the day.  Do not drive or operate equipment today.  You may resume normal activities in one to two days as instructed by your physician.   Meals: Drink plenty of liquids and eat light foods such as gelatin or soup this evening.  You may return to a normal meal plan tomorrow.  Return to Work: You may return to work in one to two days or as instructed by your physician.  Special Instructions / Symptoms: Call your physician if any of these symptoms occur:   -persistent or heavy bleeding  -bleeding which continues after first few urination  -large blood clots that are difficult to pass  -urine stream diminishes or stops completely  -fever equal to or higher than 101 degrees Farenheit.  -cloudy urine with a strong, foul odor  -severe pain  Females should always wipe from front to back after elimination.  You may feel some burning pain when you urinate.  This should disappear with time.  Applying moist heat to the lower abdomen or a hot tub bath may help relieve the pain. \

## 2021-05-03 NOTE — Transfer of Care (Signed)
Immediate Anesthesia Transfer of Care Note  Patient: Carlos Short.  Procedure(s) Performed: CYSTOSCOPY WITH LITHOLAPAXY holmium laser (Bladder)  Patient Location: PACU  Anesthesia Type:General  Level of Consciousness: drowsy and patient cooperative  Airway & Oxygen Therapy: Patient Spontanous Breathing and Patient connected to face mask oxygen  Post-op Assessment: Report given to RN and Post -op Vital signs reviewed and stable  Post vital signs: Reviewed and stable  Last Vitals:  Vitals Value Taken Time  BP 122/92 05/03/21 1436  Temp    Pulse 72 05/03/21 1442  Resp 22 05/03/21 1442  SpO2 100 % 05/03/21 1442  Vitals shown include unvalidated device data.  Last Pain:  Vitals:   05/03/21 1213  TempSrc: Oral  PainSc:       Patients Stated Pain Goal: 3 (05/03/21 1202)  Complications: No notable events documented.

## 2021-05-04 ENCOUNTER — Encounter (HOSPITAL_COMMUNITY): Payer: Self-pay | Admitting: Urology

## 2021-05-18 ENCOUNTER — Other Ambulatory Visit: Payer: Self-pay

## 2021-05-18 ENCOUNTER — Ambulatory Visit: Payer: Medicare Other | Admitting: Student

## 2021-05-18 ENCOUNTER — Encounter: Payer: Self-pay | Admitting: Student

## 2021-05-18 ENCOUNTER — Inpatient Hospital Stay: Payer: Medicare Other

## 2021-05-18 VITALS — BP 116/69 | HR 91 | Temp 98.3°F | Resp 16 | Ht 75.0 in | Wt 193.4 lb

## 2021-05-18 DIAGNOSIS — I491 Atrial premature depolarization: Secondary | ICD-10-CM

## 2021-05-18 DIAGNOSIS — I480 Paroxysmal atrial fibrillation: Secondary | ICD-10-CM

## 2021-05-18 DIAGNOSIS — I519 Heart disease, unspecified: Secondary | ICD-10-CM

## 2021-05-18 MED ORDER — OMEPRAZOLE 20 MG PO CPDR: 20.0000 mg | DELAYED_RELEASE_CAPSULE | Freq: Every day | ORAL | 1 refills | Status: DC

## 2021-05-18 NOTE — Progress Notes (Signed)
Primary Physician/Referring:  Christain Sacramento, MD  Patient ID: Carlos Short., male    DOB: 09/28/1950, 71 y.o.   MRN: [id number]  Chief Complaint  Patient presents with   [facility]   LV dysfunction   Follow-up    3 months    HPI:    Carlos Munns.  is a 71 y.o. Caucasian male with history of bipolar versus schizophrenia, alcohol abuse (quit in 1985), present tobacco use (>50-year pack history).  Patient was urgently referred to our office 12/24/2020 by PCP for bradycardia, however EKG at that time revealed sinus rhythm at a rate of 77 bpm with frequent PACs.  Patient was therefore started on Toprol-XL 25 mg daily.  He underwent echocardiogram which revealed moderate global hypokinesis with LVEF approximately 40% and moderate valvular disease.  Aortic duplex also noted abdominal aortic aneurysm, therefore recommended annual surveillance.  Patient also underwent stress test which was overall low risk. Patient's cardiac monitor subsequently revealed frequent PACs with burden of 21.6% as well as A. fib/flutter burden 14%.  Shared decision was to start patient on Eliquis given relatively high atrial fibrillation/flutter burden.  Also advised that he start Multaq, however this was postponed due to antibiotic use for recurrent UTIs.  He has now been on Multaq for >4 weeks.  He underwent cystoscopy with litholapaxy 05/03/2021.  He has had no recurrence of UTI and is no longer on antibiotics.  He is tolerating Eliquis without bleeding diathesis.  He also continues to tolerate Toprol-XL and Multaq.  Overall patient states he is slowly improving since discharge last month.  Denies chest pain, palpitations, syncope, near syncope, dizziness.  Past Medical History:  Diagnosis Date   Anxiety    Depression    Dysrhythmia 01/2021   RBBB, frequent PVCs   History of kidney stones    Hyperlipidemia    Hypertension    Hypertrophy of prostate with urinary obstruction and other lower urinary  tract symptoms (LUTS)    Insomnia, unspecified    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 ([place]) 01/29/2021   in hospital with sepsis   Memory difficulties    Mood disorder ([place])    Bipolar versus schizophrenia   Other chronic pain    neuropathy pain rt foot post op hip surg   Restless leg syndrome    Wears glasses    Wears partial dentures    top and bottom partial   Past Surgical History:  Procedure Laterality Date   CYSTOSCOPY WITH LITHOLAPAXY N/A 05/03/2021   Procedure: CYSTOSCOPY WITH LITHOLAPAXY holmium laser;  Surgeon: Vira Agar, MD;  Location: [facility];  Service: Urology;  Laterality: N/A;   HIP ARTHROPLASTY Right 2005   INGUINAL HERNIA REPAIR Bilateral 08/16/2012   Procedure: LAPAROSCOPIC BILATERAL INGUINAL HERNIA REPAIR;  Surgeon: Imogene Burn. Georgette Dover, MD;  Location: [facility];  Service: General;  Laterality: Bilateral;   INSERTION OF MESH Bilateral 08/16/2012   Procedure: INSERTION OF MESH;  Surgeon: Imogene Burn. Georgette Dover, MD;  Location: [facility];  Service: General;  Laterality: Bilateral;   MULTIPLE TOOTH EXTRACTIONS     TONSILLECTOMY     age 71   UPPER GI ENDOSCOPY     Family History  Problem Relation Age of Onset   Depression Mother    Hypertension Father    Heart attack Father 45       2 HEART ATTACKS   Kidney cancer Father    Lupus Father    Heart disease Brother  Depression Brother    Healthy Half-Brother    Migraines Neg Hx     Social History   Tobacco Use   Smoking status: Every Day    Packs/day: 0.25    Years: 50.00    Pack years: 12.50    Types: Cigarettes   Smokeless tobacco: Never  Substance Use Topics   Alcohol use: Not Currently    Comment: was an alchoholic, 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   Marital Status: Married   ROS  Review of Systems  Cardiovascular:  Negative for chest pain, claudication, leg swelling, near-syncope, orthopnea, palpitations, paroxysmal nocturnal dyspnea and syncope.  Respiratory:  Negative for shortness of  breath.   Neurological:  Negative for dizziness.   Objective  Blood pressure 116/69, pulse 91, temperature 98.3 F (36.8 C), temperature source Temporal, resp. rate 16, height 6\' 3"  (1.905 m), weight 193 lb 6.4 oz (87.7 kg), SpO2 95 %.  Vitals with BMI 05/18/2021 05/03/2021 05/03/2021  Height 6\' 3"  - -  Weight 193 lbs 6 oz - -  BMI 123456 - -  Systolic [id number]  Diastolic 69 83 81  Pulse 91 71 71      Physical Exam Vitals reviewed.  Cardiovascular:     Rate and Rhythm: Normal rate and regular rhythm. Occasional Extrasystoles are present.    Pulses: Intact distal pulses.     Heart sounds: S1 normal and S2 normal. No murmur heard.   No gallop.  Pulmonary:     Effort: Pulmonary effort is normal. No respiratory distress.     Breath sounds: No wheezing, rhonchi or rales.  Abdominal:     Comments: Prominent abdominal pulsation  Musculoskeletal:     Right lower leg: No edema.     Left lower leg: No edema.  Neurological:     Mental Status: He is alert.   Laboratory examination:   Recent Labs    04/03/21 0411 04/19/21 0045 04/20/21 0510  NA 141 137 137  K 4.0 4.3 4.1  CL 108 107 107  CO2 27 24 23   GLUCOSE 106* 95 97  BUN 15 24* 19  CREATININE 1.04 1.36* 0.95  CALCIUM 9.0 9.0 8.8*  GFRNONAA >60 56* >60   CrCl cannot be calculated (Patient's most recent lab result is older than the maximum 21 days allowed.).  CMP Latest Ref Rng & Units 04/20/2021 04/19/2021 04/03/2021  Glucose 70 - 99 mg/dL 97 95 106(H)  BUN 8 - 23 mg/dL 19 24(H) 15  Creatinine 0.61 - 1.24 mg/dL 0.95 1.36(H) 1.04  Sodium 135 - 145 mmol/L 137 137 141  Potassium 3.5 - 5.1 mmol/L 4.1 4.3 4.0  Chloride 98 - 111 mmol/L 107 107 108  CO2 22 - 32 mmol/L 23 24 27   Calcium 8.9 - 10.3 mg/dL 8.8(L) 9.0 9.0  Total Protein 6.5 - 8.1 g/dL 7.1 7.4 6.3(L)  Total Bilirubin 0.3 - 1.2 mg/dL 0.8 0.5 1.0  Alkaline Phos 38 - 126 U/L 50 58 42  AST 15 - 41 U/L 13(L) 16 13(L)  ALT 0 - 44 U/L 16 18 12    CBC Latest Ref Rng &  Units 04/20/2021 04/19/2021 04/03/2021  WBC 4.0 - 10.5 K/uL 10.4 14.2(H) 11.6(H)  Hemoglobin 13.0 - 17.0 g/dL 14.1 14.3 13.5  Hematocrit 39.0 - 52.0 % 42.7 42.2 40.6  Platelets 150 - 400 K/uL 175 194 109(L)    Lipid Panel No results for input(s): CHOL, TRIG, LDLCALC, VLDL, HDL, CHOLHDL, LDLDIRECT in the last 8760 hours.  HEMOGLOBIN A1C Lab  Results  Component Value Date   HGBA1C 5.4 04/03/2021   MPG 108 04/03/2021   TSH No results for input(s): TSH in the last 8760 hours.  External labs:   None  Allergies   Allergies  Allergen Reactions   Gabapentin Other (See Comments)    crazy   Tylenol [Acetaminophen] Other (See Comments)    Hurts his kidneys    Medications Prior to Visit:   Outpatient Medications Prior to Visit  Medication Sig Dispense Refill   apixaban (ELIQUIS) 5 MG TABS tablet Take 1 tablet (5 mg total) by mouth 2 (two) times daily. 60 tablet 3   Ascorbic Acid (VITAMIN C) 1000 MG tablet Take 1,000 mg by mouth daily.     B Complex Vitamins (B COMPLEX PO) Take 1 Dose by mouth daily. liquid     doxazosin (CARDURA) 8 MG tablet Take 1 tablet (8 mg total) by mouth daily. (Patient taking differently: Take 8 mg by mouth every morning.)     dronedarone (MULTAQ) 400 MG tablet Take 400 mg by mouth 2 (two) times daily with a meal.     fluticasone (FLONASE) 50 MCG/ACT nasal spray Place 1 spray into both nostrils daily as needed for allergies.     ibandronate (BONIVA) 150 MG tablet Take 150 mg by mouth every 30 (thirty) days. 3rd of each month     loratadine (CLARITIN) 10 MG tablet Take 10 mg by mouth daily as needed (runny nose).     metoprolol succinate (TOPROL-XL) 25 MG 24 hr tablet Take 1 tablet (25 mg total) by mouth daily. Take with or immediately following a meal. (Patient taking differently: Take 25 mg by mouth daily with lunch. Take with or immediately following a meal.) 45 tablet 3   oxybutynin (DITROPAN) 5 MG tablet Take 1 tablet (5 mg total) by mouth 3 (three) times daily  as needed for bladder spasms. 20 tablet 1   rOPINIRole (REQUIP) 1 MG tablet Take 1 mg by mouth 2 (two) times daily.     traMADol (ULTRAM) 50 MG tablet Take 1 tablet (50 mg total) by mouth every 6 (six) hours as needed (pain (max 3 tablets/24 hours)). 16 tablet 0   traZODone (DESYREL) 100 MG tablet Take 50 mg by mouth at bedtime.     No facility-administered medications prior to visit.   Final Medications at End of Visit    Current Meds  Medication Sig   apixaban (ELIQUIS) 5 MG TABS tablet Take 1 tablet (5 mg total) by mouth 2 (two) times daily.   Ascorbic Acid (VITAMIN C) 1000 MG tablet Take 1,000 mg by mouth daily.   B Complex Vitamins (B COMPLEX PO) Take 1 Dose by mouth daily. liquid   doxazosin (CARDURA) 8 MG tablet Take 1 tablet (8 mg total) by mouth daily. (Patient taking differently: Take 8 mg by mouth every morning.)   dronedarone (MULTAQ) 400 MG tablet Take 400 mg by mouth 2 (two) times daily with a meal.   fluticasone (FLONASE) 50 MCG/ACT nasal spray Place 1 spray into both nostrils daily as needed for allergies.   ibandronate (BONIVA) 150 MG tablet Take 150 mg by mouth every 30 (thirty) days. 3rd of each month   loratadine (CLARITIN) 10 MG tablet Take 10 mg by mouth daily as needed (runny nose).   metoprolol succinate (TOPROL-XL) 25 MG 24 hr tablet Take 1 tablet (25 mg total) by mouth daily. Take with or immediately following a meal. (Patient taking differently: Take 25 mg by mouth daily  with lunch. Take with or immediately following a meal.)   omeprazole (PRILOSEC) 20 MG capsule Take 1 capsule (20 mg total) by mouth daily.   oxybutynin (DITROPAN) 5 MG tablet Take 1 tablet (5 mg total) by mouth 3 (three) times daily as needed for bladder spasms.   rOPINIRole (REQUIP) 1 MG tablet Take 1 mg by mouth 2 (two) times daily.   traMADol (ULTRAM) 50 MG tablet Take 1 tablet (50 mg total) by mouth every 6 (six) hours as needed (pain (max 3 tablets/24 hours)).   traZODone (DESYREL) 100 MG  tablet Take 50 mg by mouth at bedtime.   Radiology:   No results found.  Cardiac Studies:   PCV ECHOCARDIOGRAM COMPLETE 02/01/2021 Left ventricle cavity is normal in size. Mild concentric hypertrophy of the left ventricle. Moderate global hypokinesis with frequent ectopy. LVEF around 40%. Indeterminate diastolic filling pattern. Left atrial cavity is mildly dilated. Moderate (Grade II) mitral regurgitation. Moderate tricuspid regurgitation. Mild pulmonic regurgitation. No evidence of pulmonary hypertension.   PCV MYOCARDIAL PERFUSION WO LEXISCAN 02/01/2021 Non-diagnostic ECG stress. Atrial bigeminy, occasional PVC at rest and stress. There is a fixed mild defect in the inferior region consistent with diaphragmatic attenuation. Minimal ischemia in this region cannot be excluded. Overall LV systolic function is normal without regional wall motion abnormalities. Calculated Stress LV EF: 49% but visually appears normal. No previous exam available for comparison.  Abdominal Aortic Duplex 12/30/2020:  Moderate dilatation of the abdominal aorta is noted in the proximal and  mid and distal aorta. Mild plaque noted in the proximal, mid and distal  aorta. An abdominal aortic aneurysm measuring 4.14 x 4.08 x 4.19 cm is  seenin the proximal aorta. .No septation noted.  Mild plaque noted in the right internal iliac and left internal iliac IIA.   There is a large cystic mass (appears to be simple cyst) in the suprapubic portion of the abdomen. Consider dedicated abdominal ultrasound for further delineation.  Recheck [place] in 1 year for f/u of AAA. See enclosed image.   Ambulatory cardiac telemetry 14 days (02/01/2021 - 02/15/2021): Predominant underlying rhythm was sinus.  With 14% atrial fibrillation/flutter burden, longest lasting 45 minutes.  Frequent PACs with 21.6% burden.  Occasional PVCs.  There is a single episode of nonsustained ventricular tachycardia lasting 4 beats and asymptomatic.  There  were no patient triggered events.   EKG:  05/18/2021: Sinus rhythm with PACs at a rate of 65 bpm.  Left axis.  Right bundle branch block with secondary ST-T wave changes, cannot exclude anterior ischemia.  Compared EKG 12/24/2020, no significant change.  Assessment     ICD-10-CM   1. PAC (premature atrial contraction)  I49.1 EKG 12-Lead    LONG TERM MONITOR (3-14 DAYS)    2. Paroxysmal atrial fibrillation (HCC)  I48.0 LONG TERM MONITOR (3-14 DAYS)    3. LV dysfunction  I51.9         There are no discontinued medications.   Meds ordered this encounter  Medications   omeprazole (PRILOSEC) 20 MG capsule    Sig: Take 1 capsule (20 mg total) by mouth daily.    Dispense:  30 capsule    Refill:  1   This patients CHA2DS2-VASc Score 3 (Vasc, Age, HF) and yearly risk of stroke 3.2%.   Recommendations:   Carlos Short. is a 71 y.o. Caucasian male with history of bipolar versus schizophrenia, alcohol abuse (quit in 1985), present tobacco use (>50-year pack history).  Presents for follow-up of atrial  fibrillation/flutter and PACs.  He is presently on Eliquis and tolerating this without bleeding diathesis.  Patient's blood pressure is well controlled.  He is fairly asymptomatic from a cardiovascular standpoint.  I have advised him to slowly increase physical activity as tolerated.  We will plan to repeat aortic duplex for annual surveillance.  Patient is tolerating metoprolol and Multaq at this time.  Will obtain repeat 1 week cardiac monitor to reevaluate PAC and atrial fibrillation/flutter burden.  If monitor reveals reduction of PACs and A-fib/flutter would recommend repeat echocardiogram.  Will not make changes at this time.  Follow-up in 3 months, sooner if needed.    Alethia Berthold, PA-C 05/18/2021, 11:45 AM Office: [phone]

## 2021-05-26 ENCOUNTER — Telehealth: Payer: Self-pay | Admitting: Student

## 2021-05-26 NOTE — Telephone Encounter (Signed)
Pt should be taking a whole tablet of metoprolol, correct?

## 2021-05-26 NOTE — Telephone Encounter (Signed)
Patient's wife asking if someone can send over new prescription for metoprolol because patient needs a refill. She would also like to confirm if he is supposed to be taking half a tab or a whole tab. ?

## 2021-05-27 NOTE — Telephone Encounter (Signed)
Yes correct.

## 2021-05-30 ENCOUNTER — Other Ambulatory Visit: Payer: Self-pay

## 2021-05-30 MED ORDER — METOPROLOL SUCCINATE ER 25 MG PO TB24: 25.0000 mg | ORAL_TABLET | Freq: Every day | ORAL | 3 refills | Status: DC

## 2021-05-30 NOTE — Telephone Encounter (Signed)
Called and spoke to pts wife, she voiced understanding. She will relay the message to the pt.

## 2021-06-06 ENCOUNTER — Other Ambulatory Visit: Payer: Self-pay

## 2021-06-06 MED ORDER — METOPROLOL SUCCINATE ER 25 MG PO TB24: 25.0000 mg | ORAL_TABLET | Freq: Every day | ORAL | 3 refills | Status: DC

## 2021-06-16 ENCOUNTER — Other Ambulatory Visit: Payer: Self-pay | Admitting: Student

## 2021-06-17 ENCOUNTER — Other Ambulatory Visit: Payer: Self-pay | Admitting: Student

## 2021-06-22 ENCOUNTER — Other Ambulatory Visit: Payer: Self-pay | Admitting: Student

## 2021-06-22 DIAGNOSIS — I519 Heart disease, unspecified: Secondary | ICD-10-CM

## 2021-06-22 DIAGNOSIS — I491 Atrial premature depolarization: Secondary | ICD-10-CM

## 2021-06-22 DIAGNOSIS — I480 Paroxysmal atrial fibrillation: Secondary | ICD-10-CM

## 2021-06-22 DIAGNOSIS — I493 Ventricular premature depolarization: Secondary | ICD-10-CM

## 2021-06-22 NOTE — Progress Notes (Signed)
Called pt, wife answered and was informed about the message above. Would you be able to call pt to make an appt for an echo please

## 2021-07-27 ENCOUNTER — Encounter: Payer: Self-pay | Admitting: *Deleted

## 2021-07-31 ENCOUNTER — Other Ambulatory Visit: Payer: Self-pay | Admitting: Student

## 2021-08-02 ENCOUNTER — Other Ambulatory Visit: Payer: Self-pay | Admitting: Student

## 2021-08-05 ENCOUNTER — Ambulatory Visit: Payer: Medicare Other | Admitting: Internal Medicine

## 2021-08-05 VITALS — BP 102/60 | HR 68 | Ht 75.0 in | Wt 184.0 lb

## 2021-08-05 DIAGNOSIS — I5020 Unspecified systolic (congestive) heart failure: Secondary | ICD-10-CM

## 2021-08-05 DIAGNOSIS — I491 Atrial premature depolarization: Secondary | ICD-10-CM

## 2021-08-05 DIAGNOSIS — I480 Paroxysmal atrial fibrillation: Secondary | ICD-10-CM

## 2021-08-05 HISTORY — DX: Atrial premature depolarization: I49.1

## 2021-08-05 MED ORDER — AMIODARONE HCL 200 MG PO TABS: 200.0000 mg | ORAL_TABLET | Freq: Every day | ORAL | 3 refills | Status: DC

## 2021-08-05 NOTE — Patient Instructions (Addendum)
Medication Instructions:  Your physician has recommended you make the following change in your medication:   STOP Multaq (dronedarone)  2.   START taking amiodarone 200 mg-  Take one tablet by mouth daily  Labwork: None ordered.  Testing/Procedures: None ordered.  Follow-Up: Your physician wants you to follow-up in: 3 months with Lewayne Bunting, MD    Any Other Special Instructions Will Be Listed Below (If Applicable).  If you need a refill on your cardiac medications before your next appointment, please call your pharmacy.   Important Information About Sugar      Amiodarone Tablets What is this medication? AMIODARONE (a MEE oh da rone) prevents and treats a fast or irregular heartbeat (arrhythmia). It works by slowing down overactive electric signals in the heart, which stabilizes your heart rhythm. It belongs to a group of medications called antiarrhythmics. This medicine may be used for other purposes; ask your health care provider or pharmacist if you have questions. COMMON BRAND NAME(S): Cordarone, Pacerone What should I tell my care team before I take this medication? They need to know if you have any of these conditions: Liver disease Lung disease Other heart problems Thyroid disease An unusual or allergic reaction to amiodarone, iodine, other medications, foods, dyes, or preservatives Pregnant or trying to get pregnant Breast-feeding How should I use this medication? Take this medication by mouth with a glass of water. Follow the directions on the prescription label. You can take this medication with or without food. However, you should always take it the same way each time. Take your doses at regular intervals. Do not take your medication more often than directed. Do not stop taking except on the advice of your care team. A special MedGuide will be given to you by the pharmacist with each prescription and refill. Be sure to read this information carefully each  time. Talk to your care team regarding the use of this medication in children. Special care may be needed. Overdosage: If you think you have taken too much of this medicine contact a poison control center or emergency room at once. NOTE: This medicine is only for you. Do not share this medicine with others. What if I miss a dose? If you miss a dose, take it as soon as you can. If it is almost time for your next dose, take only that dose. Do not take double or extra doses. What may interact with this medication? Do not take this medication with any of the following: Abarelix Apomorphine Arsenic trioxide Certain antibiotics like erythromycin, gemifloxacin, levofloxacin, pentamidine Certain medications for depression like amoxapine, tricyclic antidepressants Certain medications for fungal infections like fluconazole, itraconazole, ketoconazole, posaconazole, voriconazole Certain medications for irregular heartbeat like disopyramide, dronedarone, ibutilide, propafenone, sotalol Certain medications for malaria like chloroquine, halofantrine Cisapride Droperidol Haloperidol [place] Maprotiline Methadone Phenothiazines like chlorpromazine, mesoridazine, thioridazine Pimozide Ranolazine Red yeast rice Vardenafil This medication may also interact with the following: Antiviral medications for HIV or AIDS Certain medications for blood pressure, heart disease, irregular heart beat Certain medications for cholesterol like atorvastatin, cerivastatin, lovastatin, simvastatin Certain medications for hepatitis C like sofosbuvir and ledipasvir; sofosbuvir Certain medications for seizures like phenytoin Certain medications for thyroid problems Certain medications that treat or prevent blood clots like warfarin Cholestyramine Cimetidine Clopidogrel Cyclosporine Dextromethorphan Diuretics Dofetilide Fentanyl General anesthetics Grapefruit juice Lidocaine Loratadine Methotrexate Other  medications that prolong the QT interval (cause an abnormal heart rhythm) Procainamide Quinidine Rifabutin, rifampin, or rifapentine St. John's Wort Trazodone Ziprasidone This list may not  describe all possible interactions. Give your health care provider a list of all the medicines, herbs, non-prescription drugs, or dietary supplements you use. Also tell them if you smoke, drink alcohol, or use illegal drugs. Some items may interact with your medicine. What should I watch for while using this medication? Your condition will be monitored closely when you first begin therapy. Often, this medication is first started in a hospital or other monitored health care setting. Once you are on maintenance therapy, visit your care team for regular checks on your progress. Because your condition and use of this medication carry some risk, it is a good idea to carry an identification card, necklace or bracelet with details of your condition, medications, and care team. You may get drowsy or dizzy. Do not drive, use machinery, or do anything that needs mental alertness until you know how this medication affects you. Do not stand or sit up quickly, especially if you are an older patient. This reduces the risk of dizzy or fainting spells. This medication can make you more sensitive to the sun. Keep out of the sun. If you cannot avoid being in the sun, wear protective clothing and use sunscreen. Do not use sun lamps or tanning beds/booths. You should have regular eye exams before and during treatment. Call your care team if you have blurred vision, see halos, or your eyes become sensitive to light. Your eyes may get dry. It may be helpful to use a lubricating eye solution or artificial tears solution. If you are going to have surgery or a procedure that requires contrast dyes, tell your care team that you are taking this medication. What side effects may I notice from receiving this medication? Side effects that you  should report to your care team as soon as possible: Allergic reactions--skin rash, itching, hives, swelling of the face, lips, tongue, or throat Bluish-gray skin Change in vision such as blurry vision, seeing halos around lights, vision loss Heart failure--shortness of breath, swelling of the ankles, feet, or hands, sudden weight gain, unusual weakness or fatigue Heart rhythm changes--fast or irregular heartbeat, dizziness, feeling faint or lightheaded, chest pain, trouble breathing High thyroid levels (hyperthyroidism)--fast or irregular heartbeat, weight loss, excessive sweating or sensitivity to heat, tremors or shaking, anxiety, nervousness, irregular menstrual cycle or spotting Liver injury--right upper belly pain, loss of appetite, nausea, light-colored stool, dark yellow or brown urine, yellowing skin or eyes, unusual weakness or fatigue Low thyroid levels (hypothyroidism)--unusual weakness or fatigue, sensitivity to cold, constipation, hair loss, dry skin, weight gain, feelings of depression Lung injury--shortness of breath or trouble breathing, cough, spitting up blood, chest pain, fever Pain, tingling, or numbness in the hands or feet, muscle weakness, trouble walking, loss of balance or coordination Side effects that usually do not require medical attention (report to your care team if they continue or are bothersome): Nausea Vomiting This list may not describe all possible side effects. Call your doctor for medical advice about side effects. You may report side effects to FDA at 1-800-FDA-1088. Where should I keep my medication? Keep out of the reach of children and pets. Store at room temperature between 20 and 25 degrees C (68 and 77 degrees F). Protect from light. Keep container tightly closed. Throw away any unused medication after the expiration date. NOTE: This sheet is a summary. It may not cover all possible information. If you have questions about this medicine, talk to your  doctor, pharmacist, or health care provider.  2023 Elsevier/Gold Standard (2020-04-23  00:00:00)   

## 2021-08-05 NOTE — Progress Notes (Signed)
HPI Carlos Short is referred by Carlos Levering, PA for evaluation of PVC's and PAC's. He is a pleasant 71 yo man with chronic systolic heart failure, EF 40%, who has been treated with GDMT. He was found to have fairly dense symptomatic atrial ectopy as well as ventricular ectopy such that over 35% of his heart beats were premature. He has multiple other problems. He has a multitude of other medical problems including a AAA, COPD, and has been on multaq for his arrhythmias. Despite this medication, he has continued to have palpitations.  Allergies  Allergen Reactions   Gabapentin Other (See Comments)    crazy   Tylenol [Acetaminophen] Other (See Comments)    Hurts his kidneys     Current Outpatient Medications  Medication Sig Dispense Refill   amiodarone (PACERONE) 200 MG tablet Take 1 tablet (200 mg total) by mouth daily. 90 tablet 3   Ascorbic Acid (VITAMIN C) 1000 MG tablet Take 1,000 mg by mouth daily.     B Complex Vitamins (B COMPLEX PO) Take 1 Dose by mouth daily. liquid     doxazosin (CARDURA) 8 MG tablet Take 1 tablet (8 mg total) by mouth daily. (Patient taking differently: Take 8 mg by mouth every morning.)     ELIQUIS 5 MG TABS tablet TAKE 1 TABLET BY MOUTH TWICE A DAY 60 tablet 3   fluticasone (FLONASE) 50 MCG/ACT nasal spray Place 1 spray into both nostrils daily as needed for allergies.     ibandronate (BONIVA) 150 MG tablet Take 150 mg by mouth every 30 (thirty) days. 3rd of each month     loratadine (CLARITIN) 10 MG tablet Take 10 mg by mouth daily as needed (runny nose).     metoprolol succinate (TOPROL-XL) 25 MG 24 hr tablet Take 1 tablet (25 mg total) by mouth daily. Take with or immediately following a meal. 45 tablet 3   omeprazole (PRILOSEC) 20 MG capsule TAKE 1 CAPSULE BY MOUTH EVERY DAY 90 capsule 1   oxybutynin (DITROPAN) 5 MG tablet Take 1 tablet (5 mg total) by mouth 3 (three) times daily as needed for bladder spasms. 20 tablet 1   rOPINIRole (REQUIP) 1 MG  tablet Take 1 mg by mouth 2 (two) times daily.     traMADol (ULTRAM) 50 MG tablet Take 1 tablet (50 mg total) by mouth every 6 (six) hours as needed (pain (max 3 tablets/24 hours)). 16 tablet 0   traZODone (DESYREL) 100 MG tablet Take 50 mg by mouth at bedtime.     No current facility-administered medications for this visit.     Past Medical History:  Diagnosis Date   Anxiety    Depression    Dysrhythmia 01/2021   RBBB, frequent PVCs   History of kidney stones    Hyperlipidemia    Hypertension    Hypertrophy of prostate with urinary obstruction and other lower urinary tract symptoms (LUTS)    Insomnia, unspecified    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) 01/29/2021   in hospital with sepsis   Memory difficulties    Mood disorder (HCC)    Bipolar versus schizophrenia   Other chronic pain    neuropathy pain rt foot post op hip surg   Restless leg syndrome    Wears glasses    Wears partial dentures    top and bottom partial    ROS:   All systems reviewed and negative except as noted in the HPI.   Past Surgical History:  Procedure Laterality Date   CYSTOSCOPY WITH LITHOLAPAXY N/A 05/03/2021   Procedure: CYSTOSCOPY WITH LITHOLAPAXY holmium laser;  Surgeon: Despina Arias, MD;  Location: [facility];  Service: Urology;  Laterality: N/A;   HIP ARTHROPLASTY Right 2005   INGUINAL HERNIA REPAIR Bilateral 08/16/2012   Procedure: LAPAROSCOPIC BILATERAL INGUINAL HERNIA REPAIR;  Surgeon: Wilmon Arms. Corliss Skains, MD;  Location: [facility];  Service: General;  Laterality: Bilateral;   INSERTION OF MESH Bilateral 08/16/2012   Procedure: INSERTION OF MESH;  Surgeon: Wilmon Arms. Tsuei, MD;  Location: [facility];  Service: General;  Laterality: Bilateral;   MULTIPLE TOOTH EXTRACTIONS     TONSILLECTOMY     age 44   UPPER GI ENDOSCOPY       Family History  Problem Relation Age of Onset   Depression Mother    Hypertension Father    Heart attack Father 74       2 HEART  ATTACKS   Kidney cancer Father    Lupus Father    Heart disease Brother    Depression Brother    Healthy Half-Brother    Migraines Neg Hx      Social History   Socioeconomic History   Marital status: Married    Spouse name: Not on file   Number of children: 2   Years of education: Not on file   Highest education level: Not on file  Occupational History   Not on file  Tobacco Use   Smoking status: Every Day    Packs/day: 0.25    Years: 50.00    Pack years: 12.50    Types: Cigarettes   Smokeless tobacco: Never  Vaping Use   Vaping Use: Never used  Substance and Sexual Activity   Alcohol use: Not Currently    Comment: was an alchoholic, 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   Drug use: Yes    Types: Cocaine, Marijuana, Amphetamines    Comment: QUIT IN 1985, still currently still uses marijuana QD   Sexual activity: Not Currently  Other Topics Concern   Not on file  Social History Narrative   2 adopted children   Social Determinants of Health   Financial Resource Strain: Not on file  Food Insecurity: Not on file  Transportation Needs: Not on file  Physical Activity: Not on file  Stress: Not on file  Social Connections: Not on file  Intimate Partner Violence: Not on file     BP 102/60   Pulse 68   Ht 6\' 3"  (1.905 m)   Wt 184 lb (83.5 kg)   SpO2 99%   BMI 23.00 kg/m   Physical Exam:  Well appearing NAD HEENT: Unremarkable Neck:  No JVD, no thyromegally Lymphatics:  No adenopathy Back:  No CVA tenderness Lungs:  Clear with no wheezes HEART:  Regular rate rhythm, no murmurs, no rubs, no clicks Abd:  soft, positive bowel sounds, no organomegally, no rebound, no guarding Ext:  2 plus pulses, no edema, no cyanosis, no clubbing Skin:  No rashes no nodules Neuro:  CN II through XII intact, motor grossly intact  EKG - NSR with PAC's and PVC's.   Assess/Plan:  Frequent atrial and ventricular ectopy - He is symptomatic despite multaq, and I discussed the treatment options  with the patient and recommended a trial of amiodarone. He will stop multaq and start amio.  Coags - he has not had any bleeding on eliquis. Continue. HTN - his bp is well controlled. We will follow.   Carlos Erker,MD

## 2021-08-12 NOTE — Addendum Note (Signed)
Addended by: Anselm Pancoast on: 08/12/2021 01:07 PM   Modules accepted: Orders

## 2021-08-18 ENCOUNTER — Other Ambulatory Visit: Payer: Self-pay

## 2021-08-18 ENCOUNTER — Encounter (HOSPITAL_COMMUNITY): Payer: Self-pay | Admitting: Emergency Medicine

## 2021-08-18 ENCOUNTER — Emergency Department (HOSPITAL_COMMUNITY)
Admission: EM | Admit: 2021-08-18 | Discharge: 2021-08-18 | Disposition: A | Discharge status: Home / Self Care | Payer: Medicare Other | Attending: Emergency Medicine | Admitting: Emergency Medicine

## 2021-08-18 DIAGNOSIS — R4182 Altered mental status, unspecified: Secondary | ICD-10-CM | POA: Insufficient documentation

## 2021-08-18 DIAGNOSIS — Z7901 Long term (current) use of anticoagulants: Secondary | ICD-10-CM | POA: Diagnosis not present

## 2021-08-18 DIAGNOSIS — N390 Urinary tract infection, site not specified: Secondary | ICD-10-CM | POA: Diagnosis not present

## 2021-08-18 LAB — COMPREHENSIVE METABOLIC PANEL
ALT: 12 U/L (ref 0–44)
AST: 13 U/L — ABNORMAL LOW (ref 15–41)
Albumin: 3.1 g/dL — ABNORMAL LOW (ref 3.5–5.0)
Alkaline Phosphatase: 44 U/L (ref 38–126)
Anion gap: 6 (ref 5–15)
BUN: 17 mg/dL (ref 8–23)
CO2: 23 mmol/L (ref 22–32)
Calcium: 8.6 mg/dL — ABNORMAL LOW (ref 8.9–10.3)
Chloride: 111 mmol/L (ref 98–111)
Creatinine, Ser: 0.99 mg/dL (ref 0.61–1.24)
GFR, Estimated: >60 mL/min (ref >60)
Glucose, Bld: 90 mg/dL (ref 70–99)
Potassium: 3.5 mmol/L (ref 3.5–5.1)
Sodium: 140 mmol/L (ref 135–145)
Total Bilirubin: 0.5 mg/dL (ref 0.3–1.2)
Total Protein: 6.2 g/dL — ABNORMAL LOW (ref 6.5–8.1)

## 2021-08-18 LAB — CBC WITH DIFFERENTIAL/PLATELET
Abs Immature Granulocytes: 0.02 10*3/uL (ref 0.00–0.07)
Basophils Absolute: 0 10*3/uL (ref 0.0–0.1)
Basophils Relative: 0 %
Eosinophils Absolute: 0.2 10*3/uL (ref 0.0–0.5)
Eosinophils Relative: 3 %
HCT: 36.8 % — ABNORMAL LOW (ref 39.0–52.0)
Hemoglobin: 12.2 g/dL — ABNORMAL LOW (ref 13.0–17.0)
Immature Granulocytes: 0 %
Lymphocytes Relative: 23 %
Lymphs Abs: 1.6 10*3/uL (ref 0.7–4.0)
MCH: 30.7 pg (ref 26.0–34.0)
MCHC: 33.2 g/dL (ref 30.0–36.0)
MCV: 92.7 fL (ref 80.0–100.0)
Monocytes Absolute: 0.6 10*3/uL (ref 0.1–1.0)
Monocytes Relative: 9 %
Neutro Abs: 4.2 10*3/uL (ref 1.7–7.7)
Neutrophils Relative %: 65 %
Platelets: 150 10*3/uL (ref 150–400)
RBC: 3.97 MIL/uL — ABNORMAL LOW (ref 4.22–5.81)
RDW: 13.8 % (ref 11.5–15.5)
WBC: 6.7 10*3/uL (ref 4.0–10.5)
nRBC: 0 % (ref 0.0–0.2)

## 2021-08-18 LAB — URINALYSIS, ROUTINE W REFLEX MICROSCOPIC
Bilirubin Urine: NEGATIVE
Glucose, UA: NEGATIVE mg/dL
Ketones, ur: NEGATIVE mg/dL
Nitrite: POSITIVE — AB
Protein, ur: NEGATIVE mg/dL
Specific Gravity, Urine: 1.012 (ref 1.005–1.030)
WBC, UA: >50 WBC/hpf — ABNORMAL HIGH (ref 0–5)
pH: 5 (ref 5.0–8.0)

## 2021-08-18 LAB — LACTIC ACID, PLASMA: Lactic Acid, Venous: 1.2 mmol/L (ref 0.5–1.9)

## 2021-08-18 MED ORDER — SODIUM CHLORIDE 0.9 % IV SOLN
2.0000 g | Freq: Once | INTRAVENOUS | Status: AC
  Administered 2021-08-18: 2 g via INTRAVENOUS
  Filled 2021-08-18: qty 20

## 2021-08-18 MED ORDER — CEFPODOXIME PROXETIL 200 MG PO TABS
200.0000 mg | ORAL_TABLET | Freq: Two times a day (BID) | ORAL | 0 refills | Status: AC
Start: 2021-08-18 — End: 2021-08-25

## 2021-08-18 MED ORDER — SODIUM CHLORIDE 0.9 % IV BOLUS
1000.0000 mL | Freq: Once | INTRAVENOUS | Status: AC
  Administered 2021-08-18: 1000 mL via INTRAVENOUS

## 2021-08-18 NOTE — ED Triage Notes (Addendum)
Patient's wife reports that the patient has had numerous episodes of UTI's with confusion and states that he self caths himself. Patient's wife reports that the patient has been having a fever with worsening confusion and more frequent.  While in triage, patient's heart rate ranged from 29-80.

## 2021-08-18 NOTE — ED Provider Notes (Signed)
[facility]-EMERGENCY DEPT Provider Note   CSN: [id number] Arrival date & time: 08/18/21  1116     History  Chief Complaint  Patient presents with   Altered Mental Status   Bradycardia    Carlos Short. is a 71 y.o. male.  20 yoM with a chief complaint of altered mental status.  He is acting the way he normally does when he has a urinary tract infection.  His wife states that typically he gets fevers with those but has not developed a fever and so she was unsure if the same thing was ongoing.  No pain to the side no pain with urination.  She had tried a virtual visits and it was suggested that she come to the ED for evaluation.  No cough or congestion.   Altered Mental Status      Home Medications Prior to Admission medications   Medication Sig Start Date End Date Taking? Authorizing Provider  cefpodoxime (VANTIN) 200 MG tablet Take 1 tablet (200 mg total) by mouth 2 (two) times daily for 7 days. 08/18/21 08/25/21 Yes Melene Plan, DO  amiodarone (PACERONE) 200 MG tablet Take 1 tablet (200 mg total) by mouth daily. 08/05/21   Marinus Maw, MD  Ascorbic Acid (VITAMIN C) 1000 MG tablet Take 1,000 mg by mouth daily.    [provider]  B Complex Vitamins (B COMPLEX PO) Take 1 Dose by mouth daily. liquid    [provider]  doxazosin (CARDURA) 8 MG tablet Take 1 tablet (8 mg total) by mouth daily. Patient taking differently: Take 8 mg by mouth every morning. 04/04/21   Rhetta Mura, MD  ELIQUIS 5 MG TABS tablet TAKE 1 TABLET BY MOUTH TWICE A DAY 08/02/21   Cantwell, Celeste C, PA-C  fluticasone (FLONASE) 50 MCG/ACT nasal spray Place 1 spray into both nostrils daily as needed for allergies. 12/15/20   [provider]  ibandronate (BONIVA) 150 MG tablet Take 150 mg by mouth every 30 (thirty) days. 3rd of each month 11/05/20   [provider]  loratadine (CLARITIN) 10 MG tablet Take 10 mg by mouth daily as needed (runny  nose).    [provider]  metoprolol succinate (TOPROL-XL) 25 MG 24 hr tablet Take 1 tablet (25 mg total) by mouth daily. Take with or immediately following a meal. 06/06/21   Cantwell, Celeste C, PA-C  omeprazole (PRILOSEC) 20 MG capsule TAKE 1 CAPSULE BY MOUTH EVERY DAY 06/20/21   Cantwell, Celeste C, PA-C  oxybutynin (DITROPAN) 5 MG tablet Take 1 tablet (5 mg total) by mouth 3 (three) times daily as needed for bladder spasms. 05/03/21   Despina Arias, MD  rOPINIRole (REQUIP) 1 MG tablet Take 1 mg by mouth 2 (two) times daily. 12/06/20   [provider]  traMADol (ULTRAM) 50 MG tablet Take 1 tablet (50 mg total) by mouth every 6 (six) hours as needed (pain (max 3 tablets/24 hours)). 05/03/21   Despina Arias, MD  traZODone (DESYREL) 100 MG tablet Take 50 mg by mouth at bedtime. 04/14/21   [provider]      Allergies    Gabapentin and Tylenol [acetaminophen]    Review of Systems   Review of Systems  Physical Exam Updated Vital Signs BP (!) 141/79   Pulse (!) 46   Temp (!) 97.4 F (36.3 C)   Resp 20   Ht 6\' 3"  (1.905 m)   Wt 83.9 kg   SpO2 100%  BMI 23.12 kg/m  Physical Exam Vitals and nursing note reviewed.  Constitutional:      Appearance: He is well-developed.  HENT:     Head: Normocephalic and atraumatic.  Eyes:     Pupils: Pupils are equal, round, and reactive to light.  Neck:     Vascular: No JVD.  Cardiovascular:     Rate and Rhythm: Normal rate and regular rhythm.     Heart sounds: No murmur heard.    No friction rub. No gallop.  Pulmonary:     Effort: No respiratory distress.     Breath sounds: No wheezing.  Abdominal:     General: There is no distension.     Tenderness: There is no abdominal tenderness. There is no guarding or rebound.  Musculoskeletal:        General: Normal range of motion.     Cervical back: Normal range of motion and neck supple.  Skin:    Coloration: Skin is not pale.     Findings: No rash.   Neurological:     Mental Status: He is alert.     Comments: Has some trouble expressing himself.  Psychiatric:        Behavior: Behavior normal.     ED Results / Procedures / Treatments   Labs (all labs ordered are listed, but only abnormal results are displayed) Labs Reviewed  URINALYSIS, ROUTINE W REFLEX MICROSCOPIC - Abnormal; Notable for the following components:      Result Value   APPearance CLOUDY (*)    Hgb urine dipstick MODERATE (*)    Nitrite POSITIVE (*)    Leukocytes,Ua LARGE (*)    WBC, UA >50 (*)    Bacteria, UA MANY (*)    All other components within normal limits  CBC WITH DIFFERENTIAL/PLATELET - Abnormal; Notable for the following components:   RBC 3.97 (*)    Hemoglobin 12.2 (*)    HCT 36.8 (*)    All other components within normal limits  COMPREHENSIVE METABOLIC PANEL - Abnormal; Notable for the following components:   Calcium 8.6 (*)    Total Protein 6.2 (*)    Albumin 3.1 (*)    AST 13 (*)    All other components within normal limits  URINE CULTURE  LACTIC ACID, PLASMA  LACTIC ACID, PLASMA    EKG EKG Interpretation  Date/Time:  Thursday August 18 2021 11:31:41 EDT Ventricular Rate:  73 PR Interval:  209 QRS Duration: 146 QT Interval:  471 QTC Calculation: 520 R Axis:   -11 Text Interpretation: Sinus rhythm Right bundle branch block No significant change since prior 2/23 Confirmed by Aletta Edouard [phone]) on 08/18/2021 11:33:37 AM  Radiology No results found.  Procedures Procedures    Medications Ordered in ED Medications  cefTRIAXone (ROCEPHIN) 2 g in sodium chloride 0.9 % 100 mL IVPB (2 g Intravenous New Bag/Given 08/18/21 1344)  sodium chloride 0.9 % bolus 1,000 mL (0 mLs Intravenous Stopped 08/18/21 1345)    ED Course/ Medical Decision Making/ A&P                           Medical Decision Making Amount and/or Complexity of Data Reviewed Labs: ordered.  Risk Prescription drug management.   71 yo M with a chief complaint of  altered mental status.  Per the wife this is how he typically acts when he has urinary tract infection.  The patient self caths no fevers at this time.  Patient  is a bit bizarre on exam.  Will obtain a laboratory evaluation in and out specimen for urine.  Bolus of IV fluids reassess.  No leukocytosis no significant electrolyte abnormality.  The patient's urine is nitrate positive at 2 numerous to count bacteria.  I discussed results with the patient and family.  They feel comfortable with treatment and following up as an outpatient.  I reviewed the patient's medical record all of his urine cultures have been sensitive to Rocephin.  He had 1 culture that was resistant to first generation cephalosporins.  We will treat with Vantin as an outpatient.  1:59 PM:  I have discussed the diagnosis/risks/treatment options with the patient and family.  Evaluation and diagnostic testing in the emergency department does not suggest an emergent condition requiring admission or immediate intervention beyond what has been performed at this time.  They will follow up with  PCP, urology. We also discussed returning to the ED immediately if new or worsening sx occur. We discussed the sx which are most concerning (e.g., sudden worsening pain, fever, inability to tolerate by mouth, confusion, flank pain) that necessitate immediate return. Medications administered to the patient during their visit and any new prescriptions provided to the patient are listed below.  Medications given during this visit Medications  cefTRIAXone (ROCEPHIN) 2 g in sodium chloride 0.9 % 100 mL IVPB (2 g Intravenous New Bag/Given 08/18/21 1344)  sodium chloride 0.9 % bolus 1,000 mL (0 mLs Intravenous Stopped 08/18/21 1345)     The patient appears reasonably screen and/or stabilized for discharge and I doubt any other medical condition or other [facility] requiring further screening, evaluation, or treatment in the ED at this time prior to discharge.           Final Clinical Impression(s) / ED Diagnoses Final diagnoses:  Lower urinary tract infectious disease    Rx / DC Orders ED Discharge Orders          Ordered    cefpodoxime (VANTIN) 200 MG tablet  2 times daily        08/18/21 [place], Keiandre Cygan, DO 08/18/21 1359

## 2021-08-18 NOTE — Discharge Instructions (Signed)
Please follow-up with your urologist.  Return for fever or pain in your side.  Return for confusion that is dangerous at home.

## 2021-08-20 LAB — URINE CULTURE: Culture: 70000 — AB

## 2021-08-21 ENCOUNTER — Telehealth (HOSPITAL_BASED_OUTPATIENT_CLINIC_OR_DEPARTMENT_OTHER): Payer: Self-pay | Admitting: *Deleted

## 2021-08-21 NOTE — Telephone Encounter (Signed)
Post ED Visit - Positive Culture Follow-up  Culture report reviewed by antimicrobial stewardship pharmacist: [facility] []  , Pharm.D. []  Enzo Bi, Pharm.D., BCPS AQ-ID []  , Pharm.D., BCPS []  Celedonio Miyamoto, .D., BCPS []  [place], .D., BCPS, AAHIVP []  Georgina Pillion, Pharm.D., BCPS, AAHIVP []  [address], PharmD, BCPS []  , PharmD, BCPS []  [place], PharmD, BCPS []  [address], PharmD []  , PharmD, BCPS []  Estella Husk, PharmD  Pharmacy Team [x]  Crystal Robertson,PharmD []  Lysle Pearl, PharmD []  , PharmD []  Phillips Climes, Rph []  ) Agapito Games, PharmD []  , PharmD []  Verlan Friends, PharmD []  , PharmD []  Mervyn Gay, PharmD []  , PharmD []  Vinnie Level, PharmD []  [facility], PharmD []  , PharmD   Positive urine culture Treated with Cefpodoxime Proxetil, organism sensitive to the same and no further patient follow-up is required at this time.  08/21/2021, 8:23 AM

## 2021-09-14 ENCOUNTER — Encounter (HOSPITAL_COMMUNITY): Payer: Self-pay | Admitting: Student

## 2021-09-14 ENCOUNTER — Ambulatory Visit (HOSPITAL_COMMUNITY)
Admission: EM | Admit: 2021-09-14 | Discharge: 2021-09-14 | Disposition: A | Discharge status: Home / Self Care | Payer: Medicare Other | Attending: Psychiatry | Admitting: Psychiatry

## 2021-09-14 DIAGNOSIS — F01A0 Vascular dementia, mild, without behavioral disturbance, psychotic disturbance, mood disturbance, and anxiety: Secondary | ICD-10-CM | POA: Diagnosis present

## 2021-09-14 DIAGNOSIS — F39 Unspecified mood [affective] disorder: Secondary | ICD-10-CM

## 2021-09-14 DIAGNOSIS — F32A Depression, unspecified: Secondary | ICD-10-CM | POA: Insufficient documentation

## 2021-09-14 DIAGNOSIS — Z7901 Long term (current) use of anticoagulants: Secondary | ICD-10-CM | POA: Insufficient documentation

## 2021-09-14 DIAGNOSIS — R443 Hallucinations, unspecified: Secondary | ICD-10-CM | POA: Diagnosis not present

## 2021-09-14 DIAGNOSIS — I480 Paroxysmal atrial fibrillation: Secondary | ICD-10-CM | POA: Insufficient documentation

## 2021-09-14 DIAGNOSIS — R4189 Other symptoms and signs involving cognitive functions and awareness: Secondary | ICD-10-CM | POA: Diagnosis not present

## 2021-09-14 DIAGNOSIS — F03A0 Unspecified dementia, mild, without behavioral disturbance, psychotic disturbance, mood disturbance, and anxiety: Secondary | ICD-10-CM

## 2021-09-14 DIAGNOSIS — G2581 Restless legs syndrome: Secondary | ICD-10-CM | POA: Diagnosis not present

## 2021-09-14 DIAGNOSIS — G4700 Insomnia, unspecified: Secondary | ICD-10-CM | POA: Insufficient documentation

## 2021-09-14 DIAGNOSIS — Z87440 Personal history of urinary (tract) infections: Secondary | ICD-10-CM | POA: Insufficient documentation

## 2021-09-14 DIAGNOSIS — F4323 Adjustment disorder with mixed anxiety and depressed mood: Secondary | ICD-10-CM

## 2021-09-14 HISTORY — DX: Unspecified dementia, mild, without behavioral disturbance, psychotic disturbance, mood disturbance, and anxiety: F03.A0

## 2021-09-14 NOTE — ED Provider Notes (Deleted)
MOCA  visuospatial - executive 4/5 Naming - 3/3 Memory - Recalled 2/5 immediate Attention - 2/2, 0/1, 1/3 Language - 1/2, 0/1 Abstraction - 1/2 Delayed recall - 0/5 Orientation - 4/6  Total - 16/30   Signed: Princess Bruins, DO Psychiatry Resident, PGY-2 [facility] 09/14/2021, 5:58 PM

## 2021-09-14 NOTE — Progress Notes (Signed)
   09/14/21 1532  [facility] Triage Screening (Walk-ins at [facility] only)  How Did You Hear About [place]? Family/Friend  What Is the Reason for Your Visit/Call Today? 71 year old [name]. "Carlos Short" present to the [facility] accompanied by wife Carlos Short. Carlos Short report he's having health problems and it's affecting his mood. Last year between Oct 2022-Feb. 2023 had 3 ER and the 4th trip was to remove a bladder soon. The other 3 ER was UTI and some from of synopsis. Patient's wife report during this time his cognitive function started to decline. Patient is currently seen bycardiology. Patient's wife report mental concerns short-term memory, crying spell past few days 'pt report he feels so bad' due to knowing what is in the near future for him. Pt expressed when you know your time is coming to an end soon your outlook on life changes when you know things are not going get better. Pt. wife reported client is depressive symptoms lack of hope, feel worthless, lack of out look on life, negative thinking of what's to come, feel as if he's a burdon and severe energy drop. Pt. wife reports he's on a mood stabilizer prescribed by his primary doctor but couldn't think of the name. Denied suicidal/homicidal ideations and report auditory/visual hallucinations. Report he can see and hear people in the room with him. Report feel people standing behind him and the voices of neutral. Report smoke a little THC every now and then.  How Long Has This Been Causing You Problems? > than 6 months  Have You Recently Had Any Thoughts About Hurting Yourself? No  Are You Planning to Commit Suicide/Harm Yourself At This time? No  Have you Recently Had Thoughts About Hurting Someone Carlos Short? No  Are You Planning To Harm Someone At This Time? No  Are you currently experiencing any auditory, visual or other hallucinations? Yes  Please explain the hallucinations you are currently experiencing: report  auditory/visual hallucinations. Report he can see and hear people in the room with him. Report feel people standing behind him and the voices of neutral.  Have You Used Any Alcohol or Drugs in the Past 24 Hours? Yes  How long ago did you use Drugs or Alcohol? Last smoke THC yesterday morning 09/13/2021  What Did You Use and How Much? just a few hits off the blunt  Do you have any current medical co-morbidities that require immediate attention? No  Clinician description of patient physical appearance/behavior: Patient is cooperative and pleasant during the Short  What Do You Feel Would Help You the Most Today? Medication(s)  If access to [facility] was not available, would you have sought care in the Emergency Department? No  Determination of Need Routine (7 days)  Options For Referral Medication Management

## 2021-09-14 NOTE — ED Provider Notes (Cosign Needed)
Behavioral Health Urgent Care Medical Screening Exam  Patient Name: Carlos Short. MRN: [id number] Date of Evaluation: 09/14/21 Chief Complaint:   Diagnosis:  Final diagnoses:  Mood disorder (HCC)  Situational mixed anxiety and depressive disorder  Cognitive decline    History of Present illness: Carlos Wiemers. is a 71 y.o. 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  The patient has reportedly been declining cognitively over the last year, with impairments in short-term memory, depressed and labile affect, and at times becoming verbally aggressive with partner and chasing kids around the neighborhood with his car. He has been voicing passive SI for  much of his life, with no intent to act on "blowing his brains out" with a gun. However, his partner and friend are concerned that he has become more impulsive and that he could potentially follow through on such a threat.   In the last few months, he has become increasingly hopeless about his life and decline in function in the setting of recurrent UTIs c/b by kidney stones and urosepsis. He has noticed that he has mostly pleasant hallucinations of people and animals around him going about their daily stuff. Partner reports that only once did he hear voices that were scary to him. 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 Partner also reports that he repeatedly asks her the same questions and becomes episodically confused and highly  irritable for no clear reasons.   She is overwhelmed physically, emotionally, and financially taking care of him at home and has been trying to get him referred to a neurologist by his PCP. However, she states the PCP has appeared not to have documented any indications for cognitive testing despite indicating that he would two weeks ago.   Today, the patient presents with a reactive depressed mood and insomnia. He endorses prior history of VH, denies AH. Declines SI/HI.    Psychiatric Specialty Exam  Presentation  General Appearance:Casual Babette Relic, with symbol on forehead, colored hair ties in [place], groovy glasses. wife present during evaluation - this is pt nrml)  Eye Contact:Good  Speech:Clear and Coherent; Normal Rate  Speech Volume:Normal  Handedness:Right   Mood and Affect  Mood:Anxious; Depressed; Irritable  Affect:Full Range; Congruent; Appropriate   Thought Process  Thought Processes:Coherent; Goal Directed (Thought blocking) Descriptions of Associations:Circumstantial  Orientation:Partial  Thought Content:Scattered; Rumination    Hallucinations:None  Ideas of Reference:None  Suicidal Thoughts:Yes, Passive Without Intent; With Plan; Without Means to Carry Out  Homicidal Thoughts:No   Sensorium  Memory:Immediate Poor; Recent Poor; Remote Fair Judgment:Fair Insight:Fair  Executive Functions  Concentration:Poor (Had to repeat questions multiple times the patient) Attention Span:Good (Able to complete days of the week backwards without difficulty) Recall:Poor (Unable to perform immediate and delayed recall) Fund of Knowledge:Fair Language:Good  Psychomotor Activity  Psychomotor Activity:Shuffling Gait  Assets  Assets:Communication Skills; Desire for Improvement; Housing; Social Support; Resilience  Sleep  Sleep:Poor Number of hours: No data recorded  No data recorded    Physical Exam: Physical Exam  Constitutional:      Appearance: He  is normal weight.     Comments: Target dot on forehead wearing glasses  HENT:     Head: Normocephalic.  Cardiovascular:     Rate and Rhythm: Bradycardia present.  Pulmonary:     Effort: Pulmonary effort is normal.  Skin:    General: Skin is warm and dry.  Neurological:     General: No focal deficit present.     Mental Status: He is alert. He is disoriented.    ROS Blood pressure 106/80, pulse (!) 49, temperature 97.6 F (36.4 C), temperature source Oral, resp. rate 16, SpO2 98 %. There is no height or weight on file to calculate BMI.  Musculoskeletal: Strength & Muscle Tone: within normal limits Gait & Station: shuffle Patient leans: hunched over with shuffling gait  Cognitive Testing: MOCA   visuospatial - executive 4/5 Naming - 3/3 Memory - Recalled 2/5 immediate Attention - 2/2, 0/1, 1/3 Language - 1/2, 0/1 Abstraction - 1/2 Delayed recall - 0/5 Orientation - 4/6   Total - 16/30    Signed: Princess Bruins, DO Psychiatry Resident, PGY-2 [facility] 09/14/2021, 5:58 PM    [facility] MSE Discharge Disposition for Follow up and Recommendations: Based on my evaluation the patient does not appear to have an emergency medical condition and can be discharged with resources and follow up care in outpatient services for Medication Management and Individual Therapy  Carlos Short. is a 71 y.o. male with PMH of several UTIs c/b by kidney stones and urosepsis currently self-cathing, HFrEF on amiodarone & metoprolol, PAF on Eliquis, Restless Leg Syndrome on ropinirole, and unspecified mood disorder on Sertraline presenting to [facility] due to partner's concern about labile, depressed mood, aggressive behavior, and passive SI.  Patient appears to have prolonged course of depression, emotional lability, progressive, fluctuating cognitive decline (notable impairments in attention, memory), dysregulated sleep, and visual and auditory hallucinations and shuffling gait most  concerning for a Lewy Body Dementia. The differential remains broad with his polypharmacy, recurrent UTIs, and depressive symptoms all as possible contributing etiologies to his cognitive decline. Given the patient chronically endorses passive SI, does not endorse HI, is not disturbed by his "pleasant" visual  Hallucinations, and is too medically complex for most psychiatric units, we feel that he is not an imminent risk to self or others. We recommend:  - safety proofing the house by removing means of self-harm such as guns or car keys for driving - a referral to medication assistance for mitigating cost of medicines - referral to new PCP for enhanced coordination of care - referral to neurology for evaluation of dementia - could consider referral for polypharmacy evaluation or geriatrician  Ladona Mow, Medical Student 09/14/2021, 5:51 PM    Ladona Mow, Medical Student 09/14/21 1821

## 2021-09-14 NOTE — Discharge Instructions (Addendum)
To see which pharmacy near you is the CHEAPEST for certain medications, please use GoodRx. It is free website and has a free phone app.      Also consider looking at [facility] $4.00 or [facility] $7.00 prescription list. Both are free to view if googled "[facility] prescription" and "[facility] $7 prescription". These are set prices, no insurance required.   For issues with sleep, please use this free app for insomnia called CBT-I. Let your doctors and therapists know so they can help with extra tips and tricks or for guidance and accountability. NO ADDS on the app.     For therapy outside the hospital, please ask for these specific types of therapy: CBT   Please make regular appointments with an outpatient psychiatrist and other doctors once you leave the hospital.    Suicide hotline: 988 Emergency: 911   See below for additional and specific details. _________________________________ Based on what you have shared with your medical team, it is recommended that you begin outpatient therapy and possibly psychiatric services to cope with your symptoms of depression.  A list of resources have been provided below to get you started.  It is important that you follow through with treatment recommendations within 5-7 days from the day of discharge to mitigate further risk to your safety and mental well-being.  In case of an urgent emergency, you have the option of contacting the Mobile Crisis Unit with [facility] at 1.912-097-1392.  You can also contact [place] [facility] by dialing 211 for additional resources, and/or contact [facility] Medicare for a list of providers if these do not suit you.   Outpatient Therapy and Psychiatry for [facility] Recipients  [facility] [address], [address] [place], [place], [address] [phone] phone [phone] fax This place is great for gero-psych. They provide medication management only.   [facility] [address] [place], [place], [address] [phone] phone  [facility] [address] [place], [place], [address] [phone] phone  [facility] [address], [address] [place], [place], [address] [phone] phone  [facility] [address] [place], [place], [address] [phone] phone [phone] fax  [facility] [place] [address], [place], [address] [phone] phone  [facility] [address] [place], [place], [address] [phone] phone [phone] fax  [facility] [address] [place], [place], [address] [phone] phone  [facility] [address] [place], [place], [address] [phone] phone  [facility] [address] [place], [place], [address] [phone] phone [phone] fax   Medication Assistance Program [facility] [address] [place], [place], [address] [phone] phone   Check to see if you are eligible for [place] [facility] @ [employer]tn or call [phone].  You can also go to WikiOutlook.hu to see what programs would be helpful to you.

## 2021-09-14 NOTE — ED Notes (Signed)
Discharge instructions provided and Pt stated understanding. Pt alert, orient and ambulatory prior to d/c from facility. Personal belongings returned. Pt escorted to the front lobby to d/c from facility. Safety maintained.   

## 2021-09-14 NOTE — ED Provider Notes (Signed)
Behavioral Health Urgent Care Medical Screening Exam  Patient Name: Carlos Short. MRN: [id number] Date of Evaluation: 09/14/21 Chief Complaint: depression Diagnosis:  Final diagnoses:  Mood disorder (HCC)  Situational mixed anxiety and depressive disorder  Cognitive decline    History of Present illness: Carlos Short. is a 71 y.o. male with PMH of several UTIs c/b by kidney stones and urosepsis currently self-cathing, HFrEF on amiodarone & metoprolol, PAF on Eliquis, Restless Leg Syndrome on ropinirole, and unspecified mood disorder on Sertraline presenting to [facility] due to partner's concern about labile, depressed mood, aggressive behavior, and passive SI.  The patient has reportedly been declining cognitively over the last year, with impairments in short-term memory, depressed and labile affect, and at times becoming verbally aggressive with partner and chasing kids around the neighborhood with his car. He has been voicing passive SI for  much of his life, with no intent to act on "blowing his brains out" with a gun. However, his partner and friend are concerned that he has become more impulsive and that he could potentially follow through on such a threat.   In the last few months, he has become increasingly hopeless about his life and decline in function in the setting of recurrent UTIs c/b by kidney stones and urosepsis. He has noticed that he has mostly pleasant hallucinations of people and animals around him going about their daily stuff. Partner reports that only once did he hear voices that were scary to him. 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 Partner also reports that he repeatedly asks her the same questions and becomes episodically confused and  highly irritable for no clear reasons.   She is overwhelmed physically, emotionally, and financially taking care of him at home and has been trying to get him referred to a neurologist by his PCP. However, she states the PCP has appeared not to have documented any indications for cognitive testing despite indicating that he would two weeks ago.   Today, the patient presents with a reactive depressed mood and insomnia. He endorses prior history of VH, denies AH. Declines SI/HI.    Psychiatric Specialty Exam  Presentation  General Appearance:Casual Babette Relic, with symbol on forehead, colored hair ties in [place], groovy glasses. wife present during evaluation - this is pt nrml)  Eye Contact:Good  Speech:Clear and Coherent; Normal Rate  Speech Volume:Normal  Handedness:Right   Mood and Affect  Mood:Anxious; Depressed; Irritable  Affect:Full Range; Congruent; Appropriate   Thought Process  Thought Processes:Coherent; Goal Directed (Thought blocking) Descriptions of Associations:Circumstantial  Orientation:Partial  Thought Content:Scattered; Rumination    Hallucinations:None  Ideas of Reference:None  Suicidal Thoughts:Yes, Passive Without Intent; With Plan; Without Means to Carry Out  Homicidal Thoughts:No   Sensorium  Memory:Immediate Poor; Recent Poor; Remote Fair Judgment:Fair Insight:Fair  Executive Functions  Concentration:Poor (Had to repeat questions multiple times the patient) Attention Span:Good (Able to complete days of the week backwards without difficulty) Recall:Poor (Unable to perform immediate and delayed recall) Fund of Knowledge:Fair Language:Good  Psychomotor Activity  Psychomotor Activity:Shuffling Gait  Assets  Assets:Communication Skills; Desire for Improvement; Housing; Social Support; Resilience  Sleep  Sleep:Poor Number of hours: No data recorded  No data recorded    Physical Exam: Physical Exam Constitutional:  Appearance: He is normal weight.     Comments: Target dot on forehead wearing glasses  HENT:     Head: Normocephalic.  Cardiovascular:     Rate and Rhythm: Bradycardia present.  Pulmonary:     Effort: Pulmonary effort is normal.  Skin:    General: Skin is warm and dry.  Neurological:     General: No focal deficit present.     Mental Status: He is alert. He is disoriented.    ROS Blood pressure 106/80, pulse (!) 49, temperature 97.6 F (36.4 C), temperature source Oral, resp. rate 16, SpO2 98 %. There is no height or weight on file to calculate BMI.  Musculoskeletal: Strength & Muscle Tone: within normal limits Gait & Station: shuffle Patient leans: hunched over with shuffling gait  Cognitive Testing: MOCA   visuospatial - executive 4/5 Naming - 3/3 Memory - Recalled 2/5 immediate Attention - 2/2, 0/1, 1/3 Language - 1/2, 0/1 Abstraction - 1/2 Delayed recall - 0/5 Orientation - 4/6   Total - 16/30    Signed: Princess Bruins, DO Psychiatry Resident, PGY-2 [facility] 09/14/2021, 5:58 PM    [facility] MSE Discharge Disposition for Follow up and Recommendations: Based on my evaluation the patient does not appear to have an emergency medical condition and can be discharged with resources and follow up care in outpatient services for Medication Management and Individual Therapy  Carlos Short. is a 71 y.o. male with PMH of several UTIs c/b by kidney stones and urosepsis currently self-cathing, HFrEF on amiodarone & metoprolol, PAF on Eliquis, Restless Leg Syndrome on ropinirole, and unspecified mood disorder on Sertraline presenting to [facility] due to partner's concern about labile, depressed mood, aggressive behavior, and passive SI.  Patient appears to have prolonged course of depression, emotional lability, progressive, fluctuating cognitive decline (notable impairments in attention, memory), dysregulated sleep, and visual and auditory hallucinations and shuffling  gait most concerning for a Lewy Body Dementia. The differential remains broad with his polypharmacy, recurrent UTIs, and depressive symptoms all as possible contributing etiologies to his cognitive decline. Given the patient chronically endorses passive SI, does not endorse HI, is not disturbed by his "pleasant" visual  Hallucinations, and is too medically complex for most psychiatric units, we feel that he is not an imminent risk to self or others. We recommend:  - safety proofing the house by removing means of self-harm such as guns or car keys for driving - a referral to medication assistance for mitigating cost of medicines - referral to new PCP for enhanced coordination of care - referral to neurology for evaluation of dementia - could consider referral for polypharmacy evaluation or geriatrician    Ladona Mow, Medical Student 09/14/21 1821 _____________________________ I personally was present and performed or re-performed the history, physical exam and medical decision-making activities of this service and have verified that the service and findings are accurately documented in the student's note.  Carlos Short. is a 71 y.o. male, with PPH of Situational mixed anxiety and depressive disorder, who presented voluntary to [facility] (09/14/2021) with wife from home for evaluation of depression and for outpatient resources.    Patient's symptoms of depression prodrome x11yr, AVH, delirium per above, shuffling gait, cognitive decline are concerning for probable Lewy Body Dementia, although further evaluation and testing required.  Ambulatory referral to [facility] Neurology was made for further evaluation and workup.   Signed: Princess Bruins, DO Psychiatry Resident, PGY-2 [facility] 09/14/2021, 5:58  PM  

## 2021-09-15 ENCOUNTER — Encounter: Payer: Self-pay | Admitting: Physician Assistant

## 2021-09-22 ENCOUNTER — Other Ambulatory Visit (INDEPENDENT_AMBULATORY_CARE_PROVIDER_SITE_OTHER): Payer: Medicare Other

## 2021-09-22 ENCOUNTER — Ambulatory Visit: Payer: Medicare Other | Admitting: Physician Assistant

## 2021-09-22 ENCOUNTER — Encounter: Payer: Self-pay | Admitting: Physician Assistant

## 2021-09-22 VITALS — BP 94/62 | HR 56 | Resp 18 | Ht 72.0 in | Wt 175.0 lb

## 2021-09-22 DIAGNOSIS — R413 Other amnesia: Secondary | ICD-10-CM

## 2021-09-22 DIAGNOSIS — R4182 Altered mental status, unspecified: Secondary | ICD-10-CM | POA: Diagnosis not present

## 2021-09-22 DIAGNOSIS — F01A0 Vascular dementia, mild, without behavioral disturbance, psychotic disturbance, mood disturbance, and anxiety: Secondary | ICD-10-CM

## 2021-09-22 NOTE — Progress Notes (Addendum)
Assessment/Plan:    The patient is seen in neurologic consultation at the request of Bronwen Betters Philmore Pali, MD for the evaluation of memory.  Carlos Stuckey. 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 MoCA unable to be performed.  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  MRI brain with/without contrast to assess for underlying structural abnormality and assess vascular load  Neurocognitive testing to further evaluate cognitive concerns and determine other underlying cause of memory changes, including potential contribution from sleep, anxiety, or depression  Check B12, TSH EEG to rule out any seizure activity Follow-up with behavioral therapy [place] up in 1 month  Subjective:    The patient is accompanied by his wife who supplements the history.    How long did patient have memory difficulties?  Patient attributes his memory issues to age, "this has been going on for the last 15 years, worse over the last 3 years since COVID ".  Mostly, he has difficulty with short-term memory, long-term memory is normal.   Patient lives with: Spouse   repeats oneself? Endorsed for the last 5 years, but over the last few weeks this is accentuated, he is wife reports that he has the same questions every day for example "where are the AA batteries ", when she had already told him where they were. Disoriented when walking into a room?  "Probably , weeks ago, he was standing by the counter leaning down, did not respond, he said I didn't know where I was ".he also feels that he has lost some energy, and  she is unsure if this is related to that or 2 through disorientation.  For example, they were walking in [place], and they part regarding a side street, she went to the car, and he kept walking without being attentive, "spaced out " Leaving objects in unusual places?  Endorsed, he reports that he cannot find his own stuff at home "I lost my 9-5, routine. I lost a ability to create, especially since people do not have the ability to buy art anymore " Ambulates  with difficulty?   Patient denies   Recent falls?  Patient denies   Any head injuries?  31 y old "I had a cracked skull " History of seizures?   Patient denies   Wandering behavior?  Patient denies   Patient drives?   Denies any issues , short distances  Any mood changes such irritability agitation?  "I am out of steam, no one cares about my art anymore " Any history of depression?:  Patient denies   Hallucinations? Endorsed.  He sees lighting, things moving, colors and shapes change in objects.  Sensing and seeing people around him, "they are friendly but not paying attention to me ". Paranoia?  Endorsed for the last 3 years "I made mistakes in whom to trust, especially at work, I am not liked anymore ".  He accused his son of taking his gun, which he did not.  Patient reports that he sleeps + REM behavior, denies sleepwalking   Patient reports vivid dreams they are "okay " History of sleep apnea?  Patient denies   Any hygiene concerns?  Endorsed .   Independent of bathing and dressing?  Endorsed he likes to change close about 5 times a day (this is a part of the artistic world he says "   Does the patient needs help with medications?  For the last year, his wife is in charge  Who is in charge of the finances? Wife in charge   Any changes in appetite?  Patient denies   Patient have trouble swallowing? Patient denies   Does the patient cook?  Patient denies   Any kitchen accidents such as leaving the stove on? Patient denies    Any headaches?  Patient denies   Double vision? Patient denies   Any focal numbness or tingling?  Patient denies   Chronic back pain Patient denies   Unilateral weakness?  Patient denies   Any tremors?  Patient denies   Any history of anosmia?  Patient denies   Any incontinence of urine?  Patient has a history of nephrolithiasis, and sees a urologist.  He had "3 ER trips from October until February, and now he self caths 3 times a day "-wife says.   Any bowel dysfunction?  Periodically, he has constipation. History of heavy alcohol intake?  Patient denies   History of heavy tobacco use?  Patient denies   Family history of dementia?  Patient has strong family history of dementia of unknown type.    History of Present illness: Carlos Axtman. is a 71 y.o. male with PMH of several UTIs c/b by kidney stones and urosepsis currently self-cathing, HFrEF on amiodarone & metoprolol, PAF on Eliquis, Restless Leg Syndrome on ropinirole, and unspecified mood disorder on Sertraline presenting to [facility] due to partner's concern about labile, depressed mood, aggressive behavior, and passive SI.   The patient has reportedly been declining cognitively over the last year, with impairments in short-term memory, depressed and labile affect, and at times becoming verbally aggressive with partner and chasing kids around the neighborhood with his car. He has been voicing passive SI for  much of his life, with no intent to act on "blowing his brains out" with a gun. However, his partner and friend are concerned that he has become more impulsive and that he could potentially follow through on such a threat.    In the last few months, he has become increasingly hopeless about his life and decline in function in the setting of recurrent UTIs c/b by kidney stones and urosepsis. He has noticed that he has mostly pleasant hallucinations of people and animals around him going about their daily stuff. Partner reports that  only once did he hear voices that were scary to him. 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 Partner also reports that he repeatedly asks her the same questions and becomes episodically confused and highly irritable for no clear reasons.    She is overwhelmed physically, emotionally, and financially taking care of him at home and has been trying to get him referred to a neurologist by his PCP. However, she states the PCP has appeared not to have documented any indications for cognitive testing despite indicating that he would two weeks ago.    Today, the patient presents with a  reactive depressed mood and insomnia. He endorses prior history of VH, denies AH. Declines SI/HI.   Allergies  Allergen Reactions   Gabapentin Other (See Comments)    crazy   Tylenol [Acetaminophen] Other (See Comments)    Hurts his kidneys    Current Outpatient Medications  Medication Instructions   amiodarone (PACERONE) 200 mg, Oral, Daily   B Complex Vitamins (B COMPLEX PO) 1 Dose, Oral, Daily, liquid   doxazosin (CARDURA) 8 mg, Oral, Daily   ELIQUIS 5 MG TABS tablet TAKE 1 TABLET BY MOUTH TWICE A DAY   fluticasone (FLONASE) 50 MCG/ACT nasal spray 1 spray, Each Nare, Daily PRN   ibandronate (BONIVA) 150 mg, Oral, Every 30 days, 3rd of each month   loratadine (CLARITIN) 10 mg, Oral, Daily PRN   metoprolol succinate (TOPROL-XL) 25 mg, Oral, Daily, Take with or immediately following a meal.   omeprazole (PRILOSEC) 20 MG capsule TAKE 1 CAPSULE BY MOUTH EVERY DAY   oxybutynin (DITROPAN) 5 mg, Oral, 3 times daily PRN   rOPINIRole (REQUIP) 1 mg, Oral, 2 times daily   traMADol (ULTRAM) 50 mg, Oral, Every 6 hours PRN   traZODone (DESYREL) 50 mg, Oral, Daily at bedtime   vitamin C 1,000 mg, Oral,  Daily     VITALS:   Vitals:   09/22/21 1349  BP: 94/62  Pulse: (!) 56  Resp: 18  SpO2: 98%  Weight: 175 lb (79.4 kg)  Height: 6' (1.829 m)       No data to display          PHYSICAL EXAM   HEENT:  Normocephalic, atraumatic. The mucous membranes are moist. The superficial temporal arteries are without ropiness or tenderness. Cardiovascular: Regular rate and rhythm. Lungs: Clear to auscultation bilaterally. Neck: There are no carotid bruits noted bilaterally.  NEUROLOGICAL:     No data to display             09/25/2021   10:00 AM  MMSE - Mini Mental State Exam  Orientation to time 3  Orientation to Place 3  Registration 3  Attention/ Calculation 2  Recall 3  Language- name 2 objects 2  Language- repeat 1  Language- follow 3 step command 2  Language- read & follow direction 1  Write a sentence 1  Copy design 1  Total score 22     Orientation:  Alert and oriented to person, place and time. No aphasia or dysarthria. Fund of knowledge is appropriate. Recent memory impaired and remote memory intact.  Attention and concentration are normal.  Able to name objects and repeat phrases. Delayed recall 3/3 Cranial nerves: There is good facial symmetry. Extraocular muscles are intact and visual fields are full to confrontational testing. Speech is fluent and clear. Soft palate rises symmetrically and there is no tongue deviation. Hearing is intact to conversational tone. Tone: Tone is good throughout. Sensation: Sensation is intact to light touch and pinprick throughout. Vibration is intact at the bilateral big toe.There is no extinction with double simultaneous stimulation. There is no sensory dermatomal level identified. Coordination: The patient has no difficulty with RAM's or FNF bilaterally. Normal finger to nose  Motor: Strength is 5/5 in the bilateral upper and lower extremities. There is no pronator drift. There are no fasciculations noted. DTR's: Deep tendon  reflexes are 2/4 at the bilateral biceps, triceps, brachioradialis, patella and achilles.  Plantar responses are downgoing bilaterally. Gait and Station: The patient is able to ambulate without difficulty.The patient is able to heel  toe walk without any difficulty.The patient is able to ambulate in a tandem fashion. The patient is able to stand in the Romberg position.     Thank you for allowing [place] the opportunity to participate in the care of this nice patient. Please do not hesitate to contact us for any questions or concerns.   Total time spent on today's visit was 60  minutes dedicated to this patient today, preparing to see patient, examining the patient, ordering tests and/or medications and counseling the patient, documenting clinical information in the EHR or other health record, independently interpreting results and communicating results to the patient/family, discussing treatment and goals, answering patient's questions and coordinating care.  Cc:  Christain Sacramento, MD  Sharene Butters 09/25/2021 10:25 AM

## 2021-09-22 NOTE — Patient Instructions (Addendum)
It was a pleasure to see you today at our office.   Recommendations:  Follow up in 1  month Follow up with Behavioral Therapy  EEG MRi brain  Neurocognitive testing Labs today    Whom to call:  Memory  decline, memory medications: Call our office [phone]   For psychiatric meds, mood meds: Please have your primary care physician manage these medications.   Counseling regarding caregiver distress, including caregiver depression, anxiety and issues regarding community resources, adult day care programs, adult living facilities, or memory care questions:   Feel free to contact Misty Lisabeth Register, Social Worker at [phone]   For assessment of decision of mental capacity and competency:  Call Dr. Erick Blinks, geriatric psychiatrist at [phone]  For guidance in geriatric dementia issues please call [facility] [phone]    If you have any severe symptoms of a stroke, or other severe issues such as confusion,severe chills or fever, etc call 911 or go to the ER as you may need to be evaluated further       RECOMMENDATIONS FOR ALL PATIENTS WITH MEMORY PROBLEMS: 1. Continue to exercise (Recommend 30 minutes of walking everyday, or 3 hours every week) 2. Increase social interactions - continue going to [place] and enjoy social gatherings with friends and family 3. Eat healthy, avoid fried foods and eat more fruits and vegetables 4. Maintain adequate blood pressure, blood sugar, and blood cholesterol level. Reducing the risk of stroke and cardiovascular disease also helps promoting better memory. 5. Avoid stressful situations. Live a simple life and avoid aggravations. Organize your time and prepare for the next day in anticipation. 6. Sleep well, avoid any interruptions of sleep and avoid any distractions in the bedroom that may interfere with adequate sleep quality 7. Avoid sugar, avoid sweets as there is a strong link between excessive sugar intake,  diabetes, and cognitive impairment We discussed the Mediterranean diet, which has been shown to help patients reduce the risk of progressive memory disorders and reduces cardiovascular risk. This includes eating fish, eat fruits and green leafy vegetables, nuts like almonds and hazelnuts, walnuts, and also use olive oil. Avoid fast foods and fried foods as much as possible. Avoid sweets and sugar as sugar use has been linked to worsening of memory function.  There is always a concern of gradual progression of memory problems. If this is the case, then we may need to adjust level of care according to patient needs. Support, both to the patient and caregiver, should then be put into place.    FALL PRECAUTIONS: Be cautious when walking. Scan the area for obstacles that may increase the risk of trips and falls. When getting up in the mornings, sit up at the edge of the bed for a few minutes before getting out of bed. Consider elevating the bed at the head end to avoid drop of blood pressure when getting up. Walk always in a well-lit room (use night lights in the walls). Avoid area rugs or power cords from appliances in the middle of the walkways. Use a walker or a cane if necessary and consider physical therapy for balance exercise. Get your eyesight checked regularly.  FINANCIAL OVERSIGHT: Supervision, especially oversight when making financial decisions or transactions is also recommended.  HOME SAFETY: Consider the safety of the kitchen when operating appliances like stoves, microwave oven, and blender. Consider having supervision and share cooking responsibilities until no longer able to participate in those. Accidents with firearms and other hazards in the  house should be identified and addressed as well.   ABILITY TO BE LEFT ALONE: If patient is unable to contact 911 operator, consider using LifeLine, or when the need is there, arrange for someone to stay with patients. Smoking is a fire hazard,  consider supervision or cessation. Risk of wandering should be assessed by caregiver and if detected at any point, supervision and safe proof recommendations should be instituted.  MEDICATION SUPERVISION: Inability to self-administer medication needs to be constantly addressed. Implement a mechanism to ensure safe administration of the medications.   DRIVING: Regarding driving, in patients with progressive memory problems, driving will be impaired. We advise to have someone else do the driving if trouble finding directions or if minor accidents are reported. Independent driving assessment is available to determine safety of driving.   If you are interested in the driving assessment, you can contact the following:  [employer] in [place] [phone]  [facility] [phone]  [facility] [phone] or [phone]  We have sent a referral to [facility] for your MRI and they will call you directly to schedule your appointment. They are located at [address] [facility]. If you need to contact them directly please call [phone].  Your provider has requested that you have labwork completed today. Please go to [facility] ([id number]) on the second floor of this building before leaving the office today. You do not need to check in. If you are not called within 15 minutes please check with the front desk.

## 2021-09-23 LAB — TSH: TSH: 0.14 u[IU]/mL — ABNORMAL LOW (ref 0.35–5.50)

## 2021-09-23 LAB — VITAMIN B12: Vitamin B-12: 245 pg/mL (ref 211–911)

## 2021-09-23 NOTE — Progress Notes (Signed)
Please inform the patient that his TSH (thyroid), is very low, will forward these abnormal lab to his primary care physician, as these values may affect his memory.  Vitamin B12 is 245, on the low normal, recommend replenishment, with 1000 mcg of B12, over-the-counter, daily.  Thank you very much

## 2021-09-26 ENCOUNTER — Institutional Professional Consult (permissible substitution): Payer: Medicare Other | Admitting: Licensed Clinical Social Worker

## 2021-10-03 ENCOUNTER — Ambulatory Visit
Admission: RE | Admit: 2021-10-03 | Discharge: 2021-10-03 | Disposition: A | Discharge status: Home / Self Care | Payer: Medicare Other | Source: Ambulatory Visit | Attending: Physician Assistant | Admitting: Physician Assistant

## 2021-10-10 ENCOUNTER — Ambulatory Visit (HOSPITAL_COMMUNITY)
Admission: RE | Admit: 2021-10-10 | Discharge: 2021-10-10 | Disposition: A | Discharge status: Home / Self Care | Payer: Medicare Other | Source: Ambulatory Visit | Attending: Physician Assistant | Admitting: Physician Assistant

## 2021-10-10 DIAGNOSIS — R404 Transient alteration of awareness: Secondary | ICD-10-CM | POA: Diagnosis not present

## 2021-10-10 DIAGNOSIS — R413 Other amnesia: Secondary | ICD-10-CM | POA: Diagnosis not present

## 2021-10-10 DIAGNOSIS — R4182 Altered mental status, unspecified: Secondary | ICD-10-CM

## 2021-10-10 NOTE — Progress Notes (Cosign Needed Addendum)
EEG complete - results pending 

## 2021-10-11 NOTE — Procedures (Signed)
ELECTROENCEPHALOGRAM REPORT  Date of Study: 10/10/2021  Patient's Name: Carlos Short. MRN: [id number] Date of Birth: [birth year]  Referring Provider: Marlowe Kays, PA-C  Clinical History: This is a 71 year old man with episode of unresponsiveness and confusion, spacing out. EEG for classification.  Medications: Amiodarone, Doxazosin, Eliquis, Metoprolol, Ditropan, Requip, Tramadol, Trazodone  Technical Summary: A multichannel digital EEG recording measured by the international 10-20 system with electrodes applied with paste and impedances below 5000 ohms performed in our laboratory with EKG monitoring in an awake and asleep patient.  Hyperventilation and photic stimulation were not performed.  The digital EEG was referentially recorded, reformatted, and digitally filtered in a variety of bipolar and referential montages for optimal display.    Description: The patient is awake and asleep during the recording.  During maximal wakefulness, there is a symmetric, medium voltage 8 Hz posterior dominant rhythm that attenuates with eye opening.  The record is symmetric.  During drowsiness and sleep, there is an increase in theta slowing of the background.  Vertex waves and symmetric sleep spindles were seen. There were no epileptiform discharges or electrographic seizures seen.    EKG lead showed sinus bradycardia at 42 bpm.  Impression: This awake and asleep EEG is normal.    Clinical Correlation: A normal EEG does not exclude a clinical diagnosis of epilepsy.  If further clinical questions remain, prolonged EEG may be helpful.  Clinical correlation is advised.   Patrcia Dolly, M.D.

## 2021-10-12 NOTE — Progress Notes (Signed)
Please inform patient that his EEG is normal, no seizure, thanks

## 2021-10-24 ENCOUNTER — Ambulatory Visit: Payer: Medicare Other | Admitting: Physician Assistant

## 2021-10-24 ENCOUNTER — Encounter: Payer: Self-pay | Admitting: Physician Assistant

## 2021-10-24 VITALS — BP 159/60 | HR 65 | Resp 18 | Ht 72.0 in | Wt 177.0 lb

## 2021-10-24 DIAGNOSIS — F39 Unspecified mood [affective] disorder: Secondary | ICD-10-CM

## 2021-10-24 DIAGNOSIS — F01A0 Vascular dementia, mild, without behavioral disturbance, psychotic disturbance, mood disturbance, and anxiety: Secondary | ICD-10-CM | POA: Diagnosis not present

## 2021-10-24 MED ORDER — MEMANTINE HCL 5 MG PO TABS: ORAL_TABLET | ORAL | 60 refills | Status: DC

## 2021-10-24 NOTE — Progress Notes (Signed)
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 Patient is currently not on any antidementia medication.  His last MMSE on 09/22/2021 was 22/30.  EEG was negative.  MRI of the brain was remarkable for advanced cerebral volume loss without lobar predominance, and mild chronic small vessel ischemic disease.  His memory issues are also affected by situational depression due to work related issues and family problems.  Recommendations:    Start memantine 10 mg, take 1 tab p.o. nightly then increase to 1 tab p.o. twice daily after 2 weeks if tolerated side effects were discussed Strongly recommend to control possible hyperthyroidism as per PCP (last TSH 1 month ago was 0.14) Referral to Neuropsych evaluation for disease trajectory and clarity of the diagnosis Control mood as per PCP Continue to replenish vitamin B12 Recommend good control of cardiovascular risk factors Follow up in 6  months.   Case discussed with Carlos Short who agrees with the plan     Subjective:    This patient is accompanied in the office by his wife who supplements the history.  Previous records as well as any outside records available were reviewed prior to todays visit.    Any changes in memory since last visit?  He continues to have issues with short-term memory, but long-term memory is normal.  He feels that when he is stressed out, his memory is worse. Patient lives with: Wife repeats oneself?  Endorsed Disoriented when walking into a room?  Occasionally, he becomes "spaced out ".  He had a recent EEG, with normal results. Leaving objects in unusual places?   Endorsed, he reports that he cannot find his out things, he reports that he would prefer to be working, to get back to his normal self. Ambulates  with difficulty?   Patient denies   Recent falls?  Patient denies   Any new head injuries?  Patient denies   History of seizures?   Patient denies   Wandering behavior?  Patient denies   Patient drives?   He only drives short distances. Any mood changes since last visit?  None since last visit.  He occasionally has anger issues, because he is not working.  He also had some family problems in July, which "made me feel worse ". Any worsening depression?:  As mentioned above, he has depression, especially due to recent family event which affected him significantly. Hallucinations?  He is is things moving, lightening, colors and shape changes in objects, he also sees people around him, "they are friendly but not paying attention to me ". Paranoia?  He has issues with trust according to him. Patient reports that male  sleeps well without vivid dreams, REM behavior or sleepwalking   History of sleep apnea?  Patient denies   Any hygiene concerns?  Patient denies   Independent of bathing and dressing?  Endorsed.  He likes to change clothes about 5 times a day Does the patient needs help with medications?  Denies Who is in charge of the finances?   is in charge    Any changes in appetite?  Patient denies   Patient have trouble swallowing? Patient denies   Does the patient  cook?  Patient denies   Any kitchen accidents such as leaving the stove on? Patient denies   Any headaches?  Patient denies   Double vision? Patient denies   Any focal numbness or tingling?  Patient denies   Chronic back pain Patient denies   Unilateral weakness?  Patient denies   Any tremors?  Patient denies   Any history of anosmia?  Patient denies   Any incontinence of urine?  He has a history of kidney stones, he sees a urologist.  He uses self cath 3 times a day. Any bowel  dysfunction?   Patient denies        PREVIOUS MEDICATIONS:   CURRENT MEDICATIONS:  Outpatient Encounter Medications as of 10/24/2021  Medication Sig   amiodarone (PACERONE) 200 MG tablet Take 1 tablet (200 mg total) by mouth daily.   Ascorbic Acid (VITAMIN C) 1000 MG tablet Take 1,000 mg by mouth daily.   B Complex Vitamins (B COMPLEX PO) Take 1 Dose by mouth daily. liquid   doxazosin (CARDURA) 8 MG tablet Take 1 tablet (8 mg total) by mouth daily. (Patient taking differently: Take 8 mg by mouth every morning.)   ELIQUIS 5 MG TABS tablet TAKE 1 TABLET BY MOUTH TWICE A DAY   fluticasone (FLONASE) 50 MCG/ACT nasal spray Place 1 spray into both nostrils daily as needed for allergies.   ibandronate (BONIVA) 150 MG tablet Take 150 mg by mouth every 30 (thirty) days. 3rd of each month   loratadine (CLARITIN) 10 MG tablet Take 10 mg by mouth daily as needed (runny nose).   memantine (NAMENDA) 5 MG tablet Take 1 tablet (5 mg at night) for 2 weeks, then increase to 1 tablet (5 mg) twice a day   metoprolol succinate (TOPROL-XL) 25 MG 24 hr tablet Take 1 tablet (25 mg total) by mouth daily. Take with or immediately following a meal.   omeprazole (PRILOSEC) 20 MG capsule TAKE 1 CAPSULE BY MOUTH EVERY DAY   oxybutynin (DITROPAN) 5 MG tablet Take 1 tablet (5 mg total) by mouth 3 (three) times daily as needed for bladder spasms.   rOPINIRole (REQUIP) 1 MG tablet Take 1 mg by mouth 2 (two) times daily.   traMADol (ULTRAM) 50 MG tablet Take 1 tablet (50 mg total) by mouth every 6 (six) hours as needed (pain (max 3 tablets/24 hours)).   traZODone (DESYREL) 100 MG tablet Take 50 mg by mouth at bedtime.   No facility-administered encounter medications on file as of 10/24/2021.       09/25/2021   10:00 AM  MMSE - Mini Mental State Exam  Orientation to time 3  Orientation to Place 3  Registration 3  Attention/ Calculation 2  Recall 3  Language- name 2 objects 2  Language- repeat 1  Language- follow 3  step command 2  Language- read & follow direction 1  Write a sentence 1  Copy design 1  Total score 22       No data to display          Objective:     PHYSICAL EXAMINATION:    VITALS:   Vitals:   10/24/21 1115  BP: (!) 159/60  Pulse: 65  Resp: 18  SpO2: 97%  Weight: 177 lb (80.3 kg)  Height: 6' (1.829 m)    GEN:  The patient appears stated age and is in NAD. HEENT:  Normocephalic, atraumatic.   Neurological examination:  General: NAD, well-groomed, appears stated age. Orientation: The patient is alert.  Oriented to person, place and date Cranial nerves: There is good facial symmetry.The speech is fluent and clear. No aphasia or dysarthria. Fund of knowledge is appropriate. Recent and remote memory are impaired. Attention and concentration are reduced.  Able to name objects and repeat phrases.  Hearing is intact to conversational tone.    Sensation: Sensation is intact to light touch throughout Motor: Strength is at least antigravity x4. Tremors: none  DTR's 2/4 in UE/LE     Movement examination: Tone: There is normal tone in the UE/LE Abnormal movements:  no tremor.  No myoclonus.  No asterixis.   Coordination:  There is no decremation with RAM's. Normal finger to nose  Gait and Station: The patient has no difficulty arising out of a deep-seated chair without the use of the hands. The patient's stride length is good.  Gait is cautious and narrow.    Thank you for allowing [place] the opportunity to participate in the care of this nice patient. Please do not hesitate to contact us for any questions or concerns.   Total time spent on today's visit was 35 minutes dedicated to this patient today, preparing to see patient, examining the patient, ordering tests and/or medications and counseling the patient, documenting clinical information in the EHR or other health record, independently interpreting results and communicating results to the patient/family, discussing  treatment and goals, answering patient's questions and coordinating care.  Cc:  Carlos Banner, Carlos Short  Carlos Short 10/25/2021 11:54 AM

## 2021-10-24 NOTE — Patient Instructions (Addendum)
It was a pleasure to see you today at our office.   Recommendations:  Follow up iFeb 14 at 11:30 am  Referral to behavioral therapy  Neurocognitive testing Start Memantine 5mg  tablets.  Take 1 tablet at bedtime for 2 weeks, then 1 tablet twice daily.    Folllow up on the thyroid issues   Whom to call:  Memory  decline, memory medications: Call our office [phone]   For psychiatric meds, mood meds: Please have your primary care physician manage these medications.   Counseling regarding caregiver distress, including caregiver depression, anxiety and issues regarding community resources, adult day care programs, adult living facilities, or memory care questions:   Feel free to contact Misty [phone], Social Worker at [phone]   For assessment of decision of mental capacity and competency:  Call Dr. [phone], geriatric psychiatrist at [phone]  For guidance in geriatric dementia issues please call [facility] [phone]    If you have any severe symptoms of a stroke, or other severe issues such as confusion,severe chills or fever, etc call 911 or go to the ER as you may need to be evaluated further       RECOMMENDATIONS FOR ALL PATIENTS WITH MEMORY PROBLEMS: 1. Continue to exercise (Recommend 30 minutes of walking everyday, or 3 hours every week) 2. Increase social interactions - continue going to [place] and enjoy social gatherings with friends and family 3. Eat healthy, avoid fried foods and eat more fruits and vegetables 4. Maintain adequate blood pressure, blood sugar, and blood cholesterol level. Reducing the risk of stroke and cardiovascular disease also helps promoting better memory. 5. Avoid stressful situations. Live a simple life and avoid aggravations. Organize your time and prepare for the next day in anticipation. 6. Sleep well, avoid any interruptions of sleep and avoid any distractions in the bedroom that may interfere with adequate  sleep quality 7. Avoid sugar, avoid sweets as there is a strong link between excessive sugar intake, diabetes, and cognitive impairment We discussed the Mediterranean diet, which has been shown to help patients reduce the risk of progressive memory disorders and reduces cardiovascular risk. This includes eating fish, eat fruits and green leafy vegetables, nuts like almonds and hazelnuts, walnuts, and also use olive oil. Avoid fast foods and fried foods as much as possible. Avoid sweets and sugar as sugar use has been linked to worsening of memory function.  There is always a concern of gradual progression of memory problems. If this is the case, then we may need to adjust level of care according to patient needs. Support, both to the patient and caregiver, should then be put into place.    FALL PRECAUTIONS: Be cautious when walking. Scan the area for obstacles that may increase the risk of trips and falls. When getting up in the mornings, sit up at the edge of the bed for a few minutes before getting out of bed. Consider elevating the bed at the head end to avoid drop of blood pressure when getting up. Walk always in a well-lit room (use night lights in the walls). Avoid area rugs or power cords from appliances in the middle of the walkways. Use a walker or a cane if necessary and consider physical therapy for balance exercise. Get your eyesight checked regularly.  FINANCIAL OVERSIGHT: Supervision, especially oversight when making financial decisions or transactions is also recommended.  HOME SAFETY: Consider the safety of the kitchen when operating appliances like stoves, microwave oven, and blender. Consider having  supervision and share cooking responsibilities until no longer able to participate in those. Accidents with firearms and other hazards in the house should be identified and addressed as well.   ABILITY TO BE LEFT ALONE: If patient is unable to contact 911 operator, consider using [employer],  or when the need is there, arrange for someone to stay with patients. Smoking is a fire hazard, consider supervision or cessation. Risk of wandering should be assessed by caregiver and if detected at any point, supervision and safe proof recommendations should be instituted.  MEDICATION SUPERVISION: Inability to self-administer medication needs to be constantly addressed. Implement a mechanism to ensure safe administration of the medications.   DRIVING: Regarding driving, in patients with progressive memory problems, driving will be impaired. We advise to have someone else do the driving if trouble finding directions or if minor accidents are reported. Independent driving assessment is available to determine safety of driving.   If you are interested in the driving assessment, you can contact the following:  [employer] in [place] [phone]  [facility] [phone]  [facility] [phone] or [phone]  We have sent a referral to [facility] for your MRI and they will call you directly to schedule your appointment. They are located at [address] [facility]. If you need to contact them directly please call [phone].  Your provider has requested that you have labwork completed today. Please go to [facility] ([address]) on the second floor of this building before leaving the office today. You do not need to check in. If you are not called within 15 minutes please check with the front desk.

## 2021-10-28 ENCOUNTER — Ambulatory Visit: Payer: Self-pay | Admitting: Adult Health

## 2021-11-03 ENCOUNTER — Ambulatory Visit: Payer: Self-pay | Admitting: Adult Health

## 2021-11-13 ENCOUNTER — Emergency Department (HOSPITAL_COMMUNITY)
Admission: EM | Admit: 2021-11-13 | Discharge: 2021-11-13 | Disposition: A | Discharge status: Home / Self Care | Payer: Medicare Other | Attending: Emergency Medicine | Admitting: Emergency Medicine

## 2021-11-13 ENCOUNTER — Encounter (HOSPITAL_COMMUNITY): Payer: Self-pay | Admitting: Emergency Medicine

## 2021-11-13 ENCOUNTER — Other Ambulatory Visit: Payer: Self-pay

## 2021-11-13 DIAGNOSIS — D72829 Elevated white blood cell count, unspecified: Secondary | ICD-10-CM | POA: Insufficient documentation

## 2021-11-13 DIAGNOSIS — R339 Retention of urine, unspecified: Secondary | ICD-10-CM | POA: Diagnosis not present

## 2021-11-13 DIAGNOSIS — N390 Urinary tract infection, site not specified: Secondary | ICD-10-CM

## 2021-11-13 DIAGNOSIS — Z7901 Long term (current) use of anticoagulants: Secondary | ICD-10-CM | POA: Insufficient documentation

## 2021-11-13 LAB — CBC WITH DIFFERENTIAL/PLATELET
Abs Immature Granulocytes: 0.13 10*3/uL — ABNORMAL HIGH (ref 0.00–0.07)
Basophils Absolute: 0 10*3/uL (ref 0.0–0.1)
Basophils Relative: 0 %
Eosinophils Absolute: 0 10*3/uL (ref 0.0–0.5)
Eosinophils Relative: 0 %
HCT: 40.4 % (ref 39.0–52.0)
Hemoglobin: 13.7 g/dL (ref 13.0–17.0)
Immature Granulocytes: 1 %
Lymphocytes Relative: 7 %
Lymphs Abs: 1.5 10*3/uL (ref 0.7–4.0)
MCH: 30.1 pg (ref 26.0–34.0)
MCHC: 33.9 g/dL (ref 30.0–36.0)
MCV: 88.8 fL (ref 80.0–100.0)
Monocytes Absolute: 1.4 10*3/uL — ABNORMAL HIGH (ref 0.1–1.0)
Monocytes Relative: 7 %
Neutro Abs: 17.6 10*3/uL — ABNORMAL HIGH (ref 1.7–7.7)
Neutrophils Relative %: 85 %
Platelets: 178 10*3/uL (ref 150–400)
RBC: 4.55 MIL/uL (ref 4.22–5.81)
RDW: 14.4 % (ref 11.5–15.5)
WBC: 20.7 10*3/uL — ABNORMAL HIGH (ref 4.0–10.5)
nRBC: 0 % (ref 0.0–0.2)

## 2021-11-13 LAB — URINALYSIS, ROUTINE W REFLEX MICROSCOPIC
Bilirubin Urine: NEGATIVE
Glucose, UA: NEGATIVE mg/dL
Ketones, ur: NEGATIVE mg/dL
Nitrite: POSITIVE — AB
Protein, ur: 100 mg/dL — AB
Specific Gravity, Urine: 1.023 (ref 1.005–1.030)
WBC, UA: >50 WBC/hpf — ABNORMAL HIGH (ref 0–5)
pH: 5 (ref 5.0–8.0)

## 2021-11-13 LAB — BASIC METABOLIC PANEL
Anion gap: 10 (ref 5–15)
BUN: 21 mg/dL (ref 8–23)
CO2: 20 mmol/L — ABNORMAL LOW (ref 22–32)
Calcium: 8.6 mg/dL — ABNORMAL LOW (ref 8.9–10.3)
Chloride: 106 mmol/L (ref 98–111)
Creatinine, Ser: 1.11 mg/dL (ref 0.61–1.24)
GFR, Estimated: >60 mL/min (ref >60)
Glucose, Bld: 141 mg/dL — ABNORMAL HIGH (ref 70–99)
Potassium: 3.4 mmol/L — ABNORMAL LOW (ref 3.5–5.1)
Sodium: 136 mmol/L (ref 135–145)

## 2021-11-13 MED ORDER — CEFDINIR 300 MG PO CAPS: 300.0000 mg | ORAL_CAPSULE | Freq: Two times a day (BID) | ORAL | 0 refills | Status: AC

## 2021-11-13 NOTE — ED Notes (Signed)
Pt given leg bag

## 2021-11-13 NOTE — ED Triage Notes (Signed)
Pt reports difficulty urinating. Pt reports he normally self caths but he has been having issues doing so recently.

## 2021-11-13 NOTE — ED Provider Notes (Signed)
[facility]EM[facility]GENCY DEPT Provider Note   CSN: [id number] Arrival date & time: 11/13/21  1629     History  Chief Complaint  Patient presents with   Urinary Retention    Carlos Proby. is a 71 y.o. male.  71 year old male who presents with trouble urinating x2 hours.  Patient self caths himself and has had trouble passing his catheter.  Also notes decreased urination as well 2.  He endorses suprapubic pressure without fever chills or flank pain.  No hematuria noted.       Home Medications Prior to Admission medications   Medication Sig Start Date End Date Taking? Authorizing Provider  amiodarone (PACERONE) 200 MG tablet Take 1 tablet (200 mg total) by mouth daily. 08/05/21   Marinus Maw, MD  Ascorbic Acid (VITAMIN C) 1000 MG tablet Take 1,000 mg by mouth daily.    [provider]  B Complex Vitamins (B COMPLEX PO) Take 1 Dose by mouth daily. liquid    [provider]  doxazosin (CARDURA) 8 MG tablet Take 1 tablet (8 mg total) by mouth daily. Patient taking differently: Take 8 mg by mouth every morning. 04/04/21   Rhetta Mura, MD  ELIQUIS 5 MG TABS tablet TAKE 1 TABLET BY MOUTH TWICE A DAY 08/02/21   Cantwell, Celeste C, PA-C  fluticasone (FLONASE) 50 MCG/ACT nasal spray Place 1 spray into both nostrils daily as needed for allergies. 12/15/20   [provider]  ibandronate (BONIVA) 150 MG tablet Take 150 mg by mouth every 30 (thirty) days. 3rd of each month 11/05/20   [provider]  loratadine (CLARITIN) 10 MG tablet Take 10 mg by mouth daily as needed (runny nose).    [provider]  memantine (NAMENDA) 5 MG tablet Take 1 tablet (5 mg at night) for 2 weeks, then increase to 1 tablet (5 mg) twice a day 10/24/21   Marcos Eke, PA-C  metoprolol succinate (TOPROL-XL) 25 MG 24 hr tablet Take 1 tablet (25 mg total) by mouth daily. Take with or immediately following a meal. 06/06/21   Cantwell, Celeste  C, PA-C  omeprazole (PRILOSEC) 20 MG capsule TAKE 1 CAPSULE BY MOUTH EVERY DAY 06/20/21   Cantwell, Celeste C, PA-C  oxybutynin (DITROPAN) 5 MG tablet Take 1 tablet (5 mg total) by mouth 3 (three) times daily as needed for bladder spasms. 05/03/21   Despina Arias, MD  rOPINIRole (REQUIP) 1 MG tablet Take 1 mg by mouth 2 (two) times daily. 12/06/20   [provider]  traMADol (ULTRAM) 50 MG tablet Take 1 tablet (50 mg total) by mouth every 6 (six) hours as needed (pain (max 3 tablets/24 hours)). 05/03/21   Despina Arias, MD  traZODone (DESYREL) 100 MG tablet Take 50 mg by mouth at bedtime. 04/14/21   [provider]      Allergies    Gabapentin and Tylenol [acetaminophen]    Review of Systems   Review of Systems  All other systems reviewed and are negative.   Physical Exam Updated Vital Signs BP 111/78 (BP Location: Left Arm)   Pulse 81   Temp 98.6 F (37 C) (Oral)   Resp 18   SpO2 100%  Physical Exam Vitals and nursing note reviewed.  Constitutional:      General: He is not in acute distress.    Appearance: Normal appearance. He is well-developed. He is not toxic-appearing.  HENT:     Head: Normocephalic and atraumatic.  Eyes:  General: Lids are normal.     Conjunctiva/sclera: Conjunctivae normal.     Pupils: Pupils are equal, round, and reactive to light.  Neck:     Thyroid: No thyroid mass.     Trachea: No tracheal deviation.  Cardiovascular:     Rate and Rhythm: Normal rate and regular rhythm.     Heart sounds: Normal heart sounds. No murmur heard.    No gallop.  Pulmonary:     Effort: Pulmonary effort is normal. No respiratory distress.     Breath sounds: Normal breath sounds. No stridor. No decreased breath sounds, wheezing, rhonchi or rales.  Abdominal:     General: There is no distension.     Palpations: Abdomen is soft.     Tenderness: There is no abdominal tenderness. There is no rebound.  Musculoskeletal:        General: No tenderness.  Normal range of motion.     Cervical back: Normal range of motion and neck supple.  Skin:    General: Skin is warm and dry.     Findings: No abrasion or rash.  Neurological:     Mental Status: He is alert and oriented to person, place, and time. Mental status is at baseline.     GCS: GCS eye subscore is 4. GCS verbal subscore is 5. GCS motor subscore is 6.     Cranial Nerves: No cranial nerve deficit.     Sensory: No sensory deficit.     Motor: Motor function is intact.  Psychiatric:        Attention and Perception: Attention normal.        Speech: Speech normal.        Behavior: Behavior normal.     ED Results / Procedures / Treatments   Labs (all labs ordered are listed, but only abnormal results are displayed) Labs Reviewed  URINALYSIS, ROUTINE W REFLEX MICROSCOPIC    EKG None  Radiology No results found.  Procedures Procedures    Medications Ordered in ED Medications - No data to display  ED Course/ Medical Decision Making/ A&P                           Medical Decision Making Amount and/or Complexity of Data Reviewed Labs: ordered.   Patient moderate leukocytosis here.  Has evidence of UTI.  He is afebrile.  Do not feel that he is uroseptic.  Will place patient on oral antibiotics and discharge.  Foley catheter placed by nursing with good return.  He will follow-up with his urologist        Final Clinical Impression(s) / ED Diagnoses Final diagnoses:  None    Rx / DC Orders ED Discharge Orders     None         Lorre Nick, MD 11/13/21 1843

## 2021-11-16 ENCOUNTER — Other Ambulatory Visit: Payer: Self-pay | Admitting: Physician Assistant

## 2021-11-17 ENCOUNTER — Ambulatory Visit: Payer: Medicare Other | Admitting: Internal Medicine

## 2021-11-21 ENCOUNTER — Encounter: Payer: Self-pay | Admitting: Adult Health

## 2021-11-21 ENCOUNTER — Ambulatory Visit (INDEPENDENT_AMBULATORY_CARE_PROVIDER_SITE_OTHER): Payer: Medicare Other | Admitting: Adult Health

## 2021-11-21 VITALS — BP 90/58 | HR 47 | Temp 97.8°F | Resp 18 | Ht 72.0 in | Wt 173.0 lb

## 2021-11-21 DIAGNOSIS — R339 Retention of urine, unspecified: Secondary | ICD-10-CM | POA: Diagnosis not present

## 2021-11-21 DIAGNOSIS — R7989 Other specified abnormal findings of blood chemistry: Secondary | ICD-10-CM

## 2021-11-21 DIAGNOSIS — F01A0 Vascular dementia, mild, without behavioral disturbance, psychotic disturbance, mood disturbance, and anxiety: Secondary | ICD-10-CM

## 2021-11-21 DIAGNOSIS — I480 Paroxysmal atrial fibrillation: Secondary | ICD-10-CM

## 2021-11-21 DIAGNOSIS — F5101 Primary insomnia: Secondary | ICD-10-CM

## 2021-11-21 DIAGNOSIS — M810 Age-related osteoporosis without current pathological fracture: Secondary | ICD-10-CM

## 2021-11-21 DIAGNOSIS — N390 Urinary tract infection, site not specified: Secondary | ICD-10-CM | POA: Diagnosis not present

## 2021-11-21 DIAGNOSIS — Z0000 Encounter for general adult medical examination without abnormal findings: Secondary | ICD-10-CM

## 2021-11-21 DIAGNOSIS — I10 Essential (primary) hypertension: Secondary | ICD-10-CM

## 2021-11-21 DIAGNOSIS — G2581 Restless legs syndrome: Secondary | ICD-10-CM

## 2021-11-21 MED ORDER — METOPROLOL TARTRATE 25 MG PO TABS: 12.5000 mg | ORAL_TABLET | Freq: Two times a day (BID) | ORAL | 3 refills | Status: DC

## 2021-11-21 NOTE — Progress Notes (Signed)
[facility] clinic  Provider:  Kenard Gower DNP  Code Status:  Full Code  Goals of Care:     11/21/2021    1:59 PM  Advanced Directives  Does Patient Have a Medical Advance Directive? Yes  Type of Advance Directive Living will  Does patient want to make changes to medical advance directive? No - Patient declined     Chief Complaint  Patient presents with   Establish Care    Patient is here to establish care, and has pill bottles at initial appointment    HPI: Patient is a 71 y.o. male seen today to establish care with [facility]. He has a PMH of hypertension, hyperlipidemia, prior history of AAA, PAF, CHF, BPH, chronic low back pain, situational mixed anxiety, depression and bipolar disorder. He is accompanied today by his significant other. He said that he was healthy until 5 years ago. He has had almost 15X ED visit for UTI and bladder stone. He has urinary retentions and does self catheterization. In 2022, he had episode of schizophrenia in 2022. He wasn't making any sense and had feeling of grandiose. This year, he had episode wherein he stayed in bed X 2 months. Wife talked to him about having therapy. On his 2nd day of therapy, he cannot get off the bike. He was noted to have HR 31. He now follows up with cardiology.   TSH 0.14, low, in 09/22/21. He currently takes Amiodarone 200 mg daily and Metoprolol succinate 25 mg daily for atrial fibrillation. BP today at the clinic was 90/58 and HR 47.  He was recently seen in the ED on 11/13/21 for urinary retention and UTI. He was discharged on Cefdinir  He complains of having short-term memory problem. MMSE performed in the office was 27/30. MRI brain (06/13/21) showed atrophy and mild chronic microvascular ischemia.  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) 01/29/2021   in hospital with sepsis   Memory difficulties    Mood disorder (HCC)    Bipolar versus schizophrenia   Other chronic pain    neuropathy pain rt foot post op hip surg   Restless leg syndrome    Wears glasses    Wears partial dentures    top and bottom partial    Past Surgical History:  Procedure Laterality Date   CYSTOSCOPY WITH LITHOLAPAXY N/A 05/03/2021   Procedure: CYSTOSCOPY WITH LITHOLAPAXY holmium laser;  Surgeon: Despina Arias, MD;  Location: [facility];  Service: Urology;  Laterality: N/A;   HIP ARTHROPLASTY Right 2005   INGUINAL HERNIA REPAIR Bilateral 08/16/2012   Procedure: LAPAROSCOPIC BILATERAL INGUINAL HERNIA REPAIR;  Surgeon: Wilmon Arms. Corliss Skains, MD;  Location: [facility];  Service: General;  Laterality: Bilateral;   INSERTION OF MESH Bilateral 08/16/2012   Procedure: INSERTION OF MESH;  Surgeon: Wilmon Arms. Corliss Skains, MD;  Location: [facility];  Service: General;  Laterality: Bilateral;   MULTIPLE TOOTH EXTRACTIONS     TONSILLECTOMY     age 28   UPPER GI ENDOSCOPY      Allergies  Allergen Reactions   Gabapentin Other (See Comments)    crazy   Tylenol [Acetaminophen]  Other (See Comments)    Hurts his kidneys    Outpatient Encounter Medications as of 11/21/2021  Medication Sig   amiodarone (PACERONE) 200 MG tablet Take 1 tablet (200 mg total) by mouth daily.   doxazosin (CARDURA) 8 MG tablet Take 1 tablet (8 mg total) by mouth daily.   ELIQUIS 5 MG TABS tablet TAKE 1 TABLET BY MOUTH TWICE A DAY   ibandronate (BONIVA) 150 MG tablet Take 150 mg by mouth every 30 (thirty) days. 3rd of each month   memantine (NAMENDA) 5 MG tablet TAKE 1 TABLET (5 MG AT NIGHT) FOR 2 WEEKS, THEN INCREASE TO 1 TABLET (5 MG) TWICE A DAY   metoprolol succinate (TOPROL-XL) 25 MG 24 hr tablet Take 1 tablet (25 mg total) by mouth daily. Take with or  immediately following a meal.   omeprazole (PRILOSEC) 20 MG capsule TAKE 1 CAPSULE BY MOUTH EVERY DAY   oxybutynin (DITROPAN) 5 MG tablet Take 1 tablet (5 mg total) by mouth 3 (three) times daily as needed for bladder spasms.   rOPINIRole (REQUIP) 1 MG tablet Take 1 mg by mouth 2 (two) times daily.   traMADol (ULTRAM) 50 MG tablet Take 1 tablet (50 mg total) by mouth every 6 (six) hours as needed (pain (max 3 tablets/24 hours)).   traZODone (DESYREL) 100 MG tablet Take 50 mg by mouth at bedtime.   [DISCONTINUED] Ascorbic Acid (VITAMIN C) 1000 MG tablet Take 1,000 mg by mouth daily.   [DISCONTINUED] B Complex Vitamins (B COMPLEX PO) Take 1 Dose by mouth daily. liquid   [DISCONTINUED] fluticasone (FLONASE) 50 MCG/ACT nasal spray Place 1 spray into both nostrils daily as needed for allergies.   [DISCONTINUED] loratadine (CLARITIN) 10 MG tablet Take 10 mg by mouth daily as needed (runny nose).   No facility-administered encounter medications on file as of 11/21/2021.    Review of Systems:  Review of Systems  Constitutional:  Negative for activity change, appetite change and fever.  HENT:  Negative for sore throat.   Eyes: Negative.   Cardiovascular:  Negative for chest pain and leg swelling.  Gastrointestinal:  Negative for abdominal distention, diarrhea and vomiting.  Genitourinary:  Negative for dysuria, frequency and urgency.  Skin:  Negative for color change.  Neurological:  Negative for dizziness and headaches.  Psychiatric/Behavioral:  Negative for behavioral problems and sleep disturbance. The patient is not nervous/anxious.     Health Maintenance  Topic Date Due   Pneumonia Vaccine 63+ Years old (1 - PCV) Never done   Hepatitis C Screening  Never done   Zoster Vaccines- Shingrix (1 of 2) Never done   COLONOSCOPY (Pts 45-54yrs Insurance coverage will need to be confirmed)  Never done   COVID-19 Vaccine (3 - Pfizer risk series) 04/22/2020   INFLUENZA VACCINE  Never done    TETANUS/TDAP  04/10/2024   HPV VACCINES  Aged Out    Physical Exam: Vitals:   11/21/21 1400  BP: (!) 90/58  Pulse: (!) 47  Resp: 18  Temp: 97.8 F (36.6 C)  SpO2: 99%  Weight: 173 lb (78.5 kg)  Height: 6' (1.829 m)   Body mass index is 23.46 kg/m. Physical Exam Constitutional:      Appearance: Normal appearance.  HENT:     Head: Normocephalic and atraumatic.     Mouth/Throat:     Mouth: Mucous membranes are moist.  Eyes:     Conjunctiva/sclera: Conjunctivae normal.  Cardiovascular:     Rate and Rhythm: Normal rate and regular  rhythm.     Pulses: Normal pulses.     Heart sounds: Normal heart sounds.  Pulmonary:     Effort: Pulmonary effort is normal.     Breath sounds: Normal breath sounds.  Abdominal:     General: Bowel sounds are normal.     Palpations: Abdomen is soft.  Musculoskeletal:        General: No swelling. Normal range of motion.     Cervical back: Normal range of motion.  Skin:    General: Skin is warm and dry.  Neurological:     General: No focal deficit present.     Mental Status: He is alert and oriented to person, place, and time.  Psychiatric:        Mood and Affect: Mood normal.        Behavior: Behavior normal.        Thought Content: Thought content normal.        Judgment: Judgment normal.     Labs reviewed: Basic Metabolic Panel: Recent Labs    04/20/21 0510 08/18/21 1216 09/22/21 1517 11/13/21 1710  NA 137 140  --  136  K 4.1 3.5  --  3.4*  CL 107 111  --  106  CO2 23 23  --  20*  GLUCOSE 97 90  --  141*  BUN 19 17  --  21  CREATININE 0.95 0.99  --  1.11  CALCIUM 8.8* 8.6*  --  8.6*  TSH  --   --  0.14*  --    Liver Function Tests: Recent Labs    04/19/21 0045 04/20/21 0510 08/18/21 1216  AST 16 13* 13*  ALT 18 16 12   ALKPHOS 58 50 44  BILITOT 0.5 0.8 0.5  PROT 7.4 7.1 6.2*  ALBUMIN 3.8 3.7 3.1*   No results for input(s): "LIPASE", "AMYLASE" in the last 8760 hours. No results for input(s): "AMMONIA" in the  last 8760 hours. CBC: Recent Labs    04/19/21 0045 04/20/21 0510 08/18/21 1216 11/13/21 1710  WBC 14.2* 10.4 6.7 20.7*  NEUTROABS 10.1*  --  4.2 17.6*  HGB 14.3 14.1 12.2* 13.7  HCT 42.2 42.7 36.8* 40.4  MCV 94.8 96.0 92.7 88.8  PLT 194 175 150 178   Lipid Panel: No results for input(s): "CHOL", "HDL", "LDLCALC", "TRIG", "CHOLHDL", "LDLDIRECT" in the last 8760 hours. Lab Results  Component Value Date   HGBA1C 5.4 04/03/2021    Procedures since last visit: No results found.  Assessment/Plan  1. Urinary tract infection without hematuria, site unspecified -  continue Cefdinir  2. PAF (paroxysmal atrial fibrillation) (HCC) -  HR  47, will change Metoprolol succinate to Metoprolol tartrate 12.5 mg BID - metoprolol tartrate (LOPRESSOR) 25 MG tablet; Take 0.5 tablets (12.5 mg total) by mouth 2 (two) times daily. Hold for SBP <105 and HR <60  Dispense: 60 tablet; Refill: 3 -  continue Amiodarone -  follow up with cardiology  3. Urinary retention -  reported to do self catheterization -   follow up with urology  4. Essential hypertension -  BP 90/58, will change Metoprolol succinate to Metoprolol tartrate 12.5 mg BID - metoprolol tartrate (LOPRESSOR) 25 MG tablet; Take 0.5 tablets (12.5 mg total) by mouth 2 (two) times daily. Hold for SBP <105 and HR <60  Dispense: 60 tablet; Refill: 3  5. Encounter for preventive health examination - Lipid panel; Future - Hemoglobin A1C; Future - Hep B Surface Antibody; Future - Hep C Antibody; Future -  HIV antibody (with reflex); Future - Ambulatory referral to Gastroenterology for colonoscopy  6. RLS (restless legs syndrome) -  continue Ropinirol  7. Low TSH level -  will re-check - TSH; Future - T4, Free; Future - T3; Future  8. Osteoporosis  -  continue Boniva  9. 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 -   continue Memantine    Labs/tests ordered:  lipid panel, A1c,hep B and C antibody, HIV, tsh, free T4 and T3,  Next appt:  in 3 months and as needed

## 2021-11-21 NOTE — Patient Instructions (Signed)
Preventive Care 65 Years and Older, Male Preventive care refers to lifestyle choices and visits with your health care provider that can promote health and wellness. Preventive care visits are also called wellness exams. What can I expect for my preventive care visit? Counseling During your preventive care visit, your health care provider may ask about your: Medical history, including: Past medical problems. Family medical history. History of falls. Current health, including: Emotional well-being. Home life and relationship well-being. Sexual activity. Memory and ability to understand (cognition). Lifestyle, including: Alcohol, nicotine or tobacco, and drug use. Access to firearms. Diet, exercise, and sleep habits. Work and work environment. Sunscreen use. Safety issues such as seatbelt and bike helmet use. Physical exam Your health care provider will check your: Height and weight. These may be used to calculate your BMI (body mass index). BMI is a measurement that tells if you are at a healthy weight. Waist circumference. This measures the distance around your waistline. This measurement also tells if you are at a healthy weight and may help predict your risk of certain diseases, such as type 2 diabetes and high blood pressure. Heart rate and blood pressure. Body temperature. Skin for abnormal spots. What immunizations do I need?  Vaccines are usually given at various ages, according to a schedule. Your health care provider will recommend vaccines for you based on your age, medical history, and lifestyle or other factors, such as travel or where you work. What tests do I need? Screening Your health care provider may recommend screening tests for certain conditions. This may include: Lipid and cholesterol levels. Diabetes screening. This is done by checking your blood sugar (glucose) after you have not eaten for a while (fasting). Hepatitis C test. Hepatitis B test. HIV (human  immunodeficiency virus) test. STI (sexually transmitted infection) testing, if you are at risk. Lung cancer screening. Colorectal cancer screening. Prostate cancer screening. Abdominal aortic aneurysm (AAA) screening. You may need this if you are a current or former smoker. Talk with your health care provider about your test results, treatment options, and if necessary, the need for more tests. Follow these instructions at home: Eating and drinking  Eat a diet that includes fresh fruits and vegetables, whole grains, lean protein, and low-fat dairy products. Limit your intake of foods with high amounts of sugar, saturated fats, and salt. Take vitamin and mineral supplements as recommended by your health care provider. Do not drink alcohol if your health care provider tells you not to drink. If you drink alcohol: Limit how much you have to 0-2 drinks a day. Know how much alcohol is in your drink. In the [place] one drink equals one 12 oz bottle of beer (355 mL), one 5 oz glass of wine (148 mL), or one 1 oz glass of hard liquor (44 mL). Lifestyle Brush your teeth every morning and night with fluoride toothpaste. Floss one time each day. Exercise for at least 30 minutes 5 or more days each week. Do not use any products that contain nicotine or tobacco. These products include cigarettes, chewing tobacco, and vaping devices, such as e-cigarettes. If you need help quitting, ask your health care provider. Do not use drugs. If you are sexually active, practice safe sex. Use a condom or other form of protection to prevent STIs. Take aspirin only as told by your health care provider. Make sure that you understand how much to take and what form to take. Work with your health care provider to find out whether it is safe   and beneficial for you to take aspirin daily. Ask your health care provider if you need to take a cholesterol-lowering medicine (statin). Find healthy ways to manage stress, such  as: Meditation, yoga, or listening to music. Journaling. Talking to a trusted person. Spending time with friends and family. Safety Always wear your seat belt while driving or riding in a vehicle. Do not drive: If you have been drinking alcohol. Do not ride with someone who has been drinking. When you are tired or distracted. While texting. If you have been using any mind-altering substances or drugs. Wear a helmet and other protective equipment during sports activities. If you have firearms in your house, make sure you follow all gun safety procedures. Minimize exposure to UV radiation to reduce your risk of skin cancer. What's next? Visit your health care provider once a year for an annual wellness visit. Ask your health care provider how often you should have your eyes and teeth checked. Stay up to date on all vaccines. This information is not intended to replace advice given to you by your health care provider. Make sure you discuss any questions you have with your health care provider. Document Revised: 08/25/2020 Document Reviewed: 08/25/2020 Elsevier Patient Education  2023 Elsevier Inc.  

## 2021-11-22 NOTE — Progress Notes (Unsigned)
PCP:  Gillis Santa, NP Primary Cardiologist: None Electrophysiologist: None   Carlos Short. is a 71 y.o. 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) 01/29/2021   in hospital with sepsis   Memory difficulties    Mood disorder (HCC)    Bipolar versus schizophrenia   Other chronic pain    neuropathy pain rt foot post op hip surg   Restless leg syndrome    Wears glasses    Wears partial dentures    top and bottom partial   Past Surgical History:  Procedure Laterality Date   CYSTOSCOPY WITH LITHOLAPAXY N/A 05/03/2021   Procedure: CYSTOSCOPY WITH LITHOLAPAXY holmium laser;  Surgeon: Despina Arias, MD;  Location: [facility];  Service: Urology;  Laterality: N/A;   HIP ARTHROPLASTY Right 2005   INGUINAL HERNIA REPAIR Bilateral 08/16/2012   Procedure: LAPAROSCOPIC BILATERAL INGUINAL HERNIA REPAIR;  Surgeon: Wilmon Arms. Corliss Skains, MD;  Location: [facility];  Service: General;  Laterality: Bilateral;   INSERTION OF MESH Bilateral 08/16/2012   Procedure: INSERTION OF MESH;  Surgeon: Wilmon Arms. Corliss Skains, MD;  Location:  [facility];  Service: General;  Laterality: Bilateral;   MULTIPLE TOOTH EXTRACTIONS     TONSILLECTOMY     age 54   UPPER GI ENDOSCOPY      Current Outpatient Medications  Medication Sig Dispense Refill   amiodarone (PACERONE) 200 MG tablet Take 1 tablet (200 mg total) by mouth daily. 90 tablet 3   doxazosin (CARDURA) 8 MG tablet Take 1 tablet (8 mg total) by mouth daily.     ELIQUIS 5 MG TABS tablet TAKE 1 TABLET BY MOUTH TWICE A DAY 60 tablet 3   ibandronate (BONIVA) 150 MG tablet Take 150 mg by  mouth every 30 (thirty) days. 3rd of each month     memantine (NAMENDA) 5 MG tablet TAKE 1 TABLET (5 MG AT NIGHT) FOR 2 WEEKS, THEN INCREASE TO 1 TABLET (5 MG) TWICE A DAY 180 tablet 4   metoprolol tartrate (LOPRESSOR) 25 MG tablet Take 0.5 tablets (12.5 mg total) by mouth 2 (two) times daily. Hold for SBP <105 and HR <60 60 tablet 3   omeprazole (PRILOSEC) 20 MG capsule TAKE 1 CAPSULE BY MOUTH EVERY DAY 90 capsule 1   rOPINIRole (REQUIP) 1 MG tablet Take 1 mg by mouth 2 (two) times daily.     traMADol (ULTRAM) 50 MG tablet Take 1 tablet (50 mg total) by mouth every 6 (six) hours as needed (pain (max 3 tablets/24 hours)). 16 tablet 0   traZODone (DESYREL) 100 MG tablet Take 50 mg by mouth at bedtime.     No current facility-administered medications for this visit.    Allergies  Allergen Reactions   Gabapentin Other (See Comments)    crazy   Tylenol [Acetaminophen] Other (See Comments)    Hurts his kidneys    Social History   Socioeconomic History   Marital status: Married    Spouse name: Not on file   Number of children: 2   Years of education: Not on file   Highest education level:  Not on file  Occupational History   Not on file  Tobacco Use   Smoking status: Every Day    Packs/day: 0.25    Years: 50.00    Total pack years: 12.50    Types: Cigarettes   Smokeless tobacco: Never  Vaping Use   Vaping Use: Never used  Substance and Sexual Activity   Alcohol use: Not Currently    Comment: was an alchoholic, 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   Drug use: Yes    Types: Marijuana    Comment: QUIT IN 1985, still currently still uses marijuana QD   Sexual activity: Not Currently  Other Topics Concern   Not on file  Social History Narrative   2 adopted children   Right handed   Drinks coffee   2 story home   Social Determinants of Health   Financial Resource Strain: Not on file  Food Insecurity: Not on file  Transportation Needs: Not on file  Physical Activity: Not on file  Stress:  Not on file  Social Connections: Not on file  Intimate Partner Violence: Not on file     Review of Systems: All other systems reviewed and are otherwise negative except as noted above.  Physical Exam: There were no vitals filed for this visit.  GEN- The patient is well appearing, alert and oriented x 3 today.   HEENT: normocephalic, atraumatic; sclera clear, conjunctiva pink; hearing intact; oropharynx clear; neck supple, no JVP Lymph- no cervical lymphadenopathy Lungs- Clear to ausculation bilaterally, normal work of breathing.  No wheezes, rales, rhonchi Heart- {Blank single:19197::"Regular","Irregularly irregular"} rate and rhythm, no murmurs, rubs or gallops, PMI not laterally displaced GI- soft, non-tender, non-distended, bowel sounds present, no hepatosplenomegaly Extremities- {EDEMA JMEQA:83419} peripheral edema. no clubbing or cyanosis; DP/PT/radial pulses 2+ bilaterally MS- no significant deformity or atrophy Skin- warm and dry, no rash or lesion Psych- euthymic mood, full affect Neuro- strength and sensation are intact  EKG is ordered. Personal review of EKG from today shows ***  Additional studies reviewed include: Previous EP office notes.   Assessment and Plan:  1. Frequent atrial and ventricular ectopy Previously stopped multaq and started on amiodarone Symptoms *** Monitor ***  2. HTN Stable on current regimen   3. Coags No bleeding on Eliquis  Follow up with Dr. Ladona Ridgel in 6 months   Graciella Freer, PA-C  11/22/21 9:27 PM

## 2021-11-23 ENCOUNTER — Ambulatory Visit: Payer: Medicare Other | Attending: Internal Medicine | Admitting: Student

## 2021-11-23 ENCOUNTER — Encounter: Payer: Self-pay | Admitting: Student

## 2021-11-23 VITALS — BP 120/74 | HR 56 | Ht 72.0 in | Wt 171.4 lb

## 2021-11-23 DIAGNOSIS — I491 Atrial premature depolarization: Secondary | ICD-10-CM | POA: Diagnosis not present

## 2021-11-23 DIAGNOSIS — I493 Ventricular premature depolarization: Secondary | ICD-10-CM | POA: Diagnosis not present

## 2021-11-23 MED ORDER — AMIODARONE HCL 200 MG PO TABS: 200.0000 mg | ORAL_TABLET | Freq: Every day | ORAL | 3 refills | Status: DC

## 2021-11-23 NOTE — Patient Instructions (Signed)
Medication Instructions:  Your physician has recommended you make the following change in your medication:   DECREASE: Amiodarone to 200mg  daily Monday-Friday only.  *If you need a refill on your cardiac medications before your next appointment, please call your pharmacy*   Lab Work: None If you have labs (blood work) drawn today and your tests are completely normal, you will receive your results only by: MyChart Message (if you have MyChart) OR A paper copy in the mail If you have any lab test that is abnormal or we need to change your treatment, we will call you to review the results.   Follow-Up: At [facility], you and your health needs are our priority.  As part of our continuing mission to provide you with exceptional heart care, we have created designated Provider Care Teams.  These Care Teams include your primary Cardiologist (physician) and Advanced Practice Providers (APPs -  Physician Assistants and Nurse Practitioners) who all work together to provide you with the care you need, when you need it.   Your next appointment:   6 month(s)  The format for your next appointment:   In Person  Provider:   [facility], MD

## 2021-11-29 ENCOUNTER — Other Ambulatory Visit: Payer: Medicare Other

## 2021-11-29 DIAGNOSIS — R7989 Other specified abnormal findings of blood chemistry: Secondary | ICD-10-CM

## 2021-11-29 DIAGNOSIS — Z0000 Encounter for general adult medical examination without abnormal findings: Secondary | ICD-10-CM

## 2021-11-30 ENCOUNTER — Ambulatory Visit: Payer: Medicare Other | Admitting: Urology

## 2021-11-30 ENCOUNTER — Encounter: Payer: Self-pay | Admitting: Urology

## 2021-11-30 VITALS — BP 112/73 | HR 83 | Ht 72.0 in | Wt 171.0 lb

## 2021-11-30 DIAGNOSIS — R339 Retention of urine, unspecified: Secondary | ICD-10-CM

## 2021-11-30 DIAGNOSIS — N200 Calculus of kidney: Secondary | ICD-10-CM

## 2021-11-30 LAB — LIPID PANEL
Cholesterol: 179 mg/dL (ref <200)
HDL: 63 mg/dL (ref >=40)
LDL Cholesterol (Calc): 101 mg/dL (calc) — ABNORMAL HIGH
Non-HDL Cholesterol (Calc): 116 mg/dL (calc) (ref <130)
Total CHOL/HDL Ratio: 2.8 (calc) (ref <5.0)
Triglycerides: 64 mg/dL (ref <150)

## 2021-11-30 LAB — HIV ANTIBODY (ROUTINE TESTING W REFLEX): HIV 1&2 Ab, 4th Generation: NONREACTIVE

## 2021-11-30 LAB — URINALYSIS, COMPLETE
Bilirubin, UA: NEGATIVE
Glucose, UA: NEGATIVE
Ketones, UA: NEGATIVE
Nitrite, UA: POSITIVE — AB
Protein,UA: NEGATIVE
Specific Gravity, UA: 1.02 (ref 1.005–1.030)
Urobilinogen, Ur: 0.2 mg/dL (ref 0.2–1.0)
pH, UA: 6 (ref 5.0–7.5)

## 2021-11-30 LAB — MICROSCOPIC EXAMINATION: WBC, UA: >30 /hpf — AB (ref 0–5)

## 2021-11-30 LAB — TSH: TSH: 0.95 mIU/L (ref 0.40–4.50)

## 2021-11-30 LAB — HEMOGLOBIN A1C
Hgb A1c MFr Bld: 5.5 % of total Hgb (ref <5.7)
Mean Plasma Glucose: 111 mg/dL
eAG (mmol/L): 6.2 mmol/L

## 2021-11-30 LAB — T3: T3, Total: 114 ng/dL (ref 76–181)

## 2021-11-30 LAB — HEPATITIS C ANTIBODY: Hepatitis C Ab: NONREACTIVE

## 2021-11-30 LAB — HEPATITIS B SURFACE ANTIBODY,QUALITATIVE: Hep B S Ab: NONREACTIVE

## 2021-11-30 LAB — T4, FREE: Free T4: 1.1 ng/dL (ref 0.8–1.8)

## 2021-11-30 NOTE — Progress Notes (Signed)
-     LDL ( low-density lipoprotein)  101 elevated by a point ( normal <100) -  tsh 0.95, normal, great!  A1c is normal

## 2021-12-01 ENCOUNTER — Ambulatory Visit (INDEPENDENT_AMBULATORY_CARE_PROVIDER_SITE_OTHER): Payer: Medicare Other | Admitting: Psychologist

## 2021-12-01 DIAGNOSIS — F331 Major depressive disorder, recurrent, moderate: Secondary | ICD-10-CM

## 2021-12-01 NOTE — Progress Notes (Signed)
[facility] Counselor Initial Adult Exam  Name: Carlos Short. Date: 12/01/2021 MRN: [id number] DOB: 01/11/1951 PCP: Gillis Santa, NP  Time spent: 1:05 pm to 1:40 pm; total time: 35 minutes  This session was held via in person. The patient consented to in-person therapy and was in the clinician's office. Limits of confidentiality were discussed with the patient.   Guardian/Payee:  NA    Paperwork requested: No   Reason for Visit /Presenting Problem: Depression  Mental Status Exam: Appearance:   Neat     Behavior:  Appropriate  Motor:  Normal  Speech/Language:   Clear and Coherent  Affect:  Appropriate  Mood:  normal  Thought process:  normal  Thought content:    WNL  Sensory/Perceptual disturbances:    WNL  Orientation:  oriented to person, place, and time/date  Attention:  Good  Concentration:  Good  Memory:  WNL  Fund of knowledge:   Fair  Insight:    Fair  Judgment:   Fair  Impulse Control:  Good     Reported Symptoms:  The patient endorsed experiencing the following: feeling down, sad, tearful, rumination of thoughts, social isolation, fatigue, avoiding pleasurable activities, low self-esteem, and thoughts of hopelessness. He denied suicidal and homicidal ideation.   Risk Assessment: Danger to Self:  No Self-injurious Behavior: No Danger to Others: No Duty to Warn:no Physical Aggression / Violence:No  Access to Firearms a concern: No  Gang Involvement:No  Patient / guardian was educated about steps to take if suicide or homicide risk level increases between visits: n/a While future psychiatric events cannot be accurately predicted, the patient does not currently require acute inpatient psychiatric care and does not currently meet [facility] involuntary commitment criteria.  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  , emotional   Report needed: No. Victim of Neglect:No. Perpetrator of  NA   Witness / Exposure to Domestic Violence: No   Protective Services Involvement: No  Witness to [employer] Violence:  No   Family History:  Family History  Problem Relation Age of Onset   Depression Mother    Hypertension Father    Heart attack Father 55       2 HEART ATTACKS   Kidney cancer Father    Lupus Father    Heart disease Brother    Depression Brother    Healthy Half-Brother    Migraines Neg Hx     Living situation: the patient lives with their spouse  Sexual Orientation: Straight  Relationship Status: married  Name of spouse / other:Carlos Short. 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 History of legal issue / charges:  na  Medical History/Surgical History: reviewed Past Medical History:  Diagnosis Date   Anxiety    Depression    Dysrhythmia 01/2021   RBBB, frequent PVCs   History of kidney stones    Hyperlipidemia    Hypertension    Hypertrophy of prostate with urinary obstruction and other lower urinary tract symptoms (LUTS)    Insomnia,  unspecified    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 ([place]) 01/29/2021   in hospital with sepsis   Memory difficulties    Mood disorder ([place])    Bipolar versus schizophrenia   Other chronic pain    neuropathy pain rt foot post op hip surg   Restless leg syndrome    Wears glasses    Wears partial dentures    top and bottom partial    Past Surgical History:  Procedure Laterality Date   CYSTOSCOPY WITH LITHOLAPAXY N/A 05/03/2021   Procedure: CYSTOSCOPY WITH LITHOLAPAXY holmium laser;  Surgeon: Vira Agar, MD;  [facility];  Service: Urology;  Laterality: N/A;   HIP ARTHROPLASTY Right 2005   INGUINAL HERNIA REPAIR Bilateral 08/16/2012   Procedure: LAPAROSCOPIC BILATERAL INGUINAL HERNIA REPAIR;  Surgeon: Imogene Burn. Georgette Dover, MD;  Location: [place];  Service: General;  Laterality: Bilateral;   INSERTION OF MESH Bilateral 08/16/2012   Procedure: INSERTION OF MESH;  Surgeon: Imogene Burn. Georgette Dover, MD;  Location: [place];  Service: General;  Laterality: Bilateral;   MULTIPLE TOOTH EXTRACTIONS     TONSILLECTOMY     age 71   UPPER GI ENDOSCOPY      Medications: Current Outpatient Medications  Medication Sig Dispense Refill   amiodarone (PACERONE) 200 MG tablet Take 1 tablet (200 mg total) by mouth daily. Monday - Friday only 90 tablet 3   doxazosin (CARDURA) 8 MG tablet Take 1 tablet (8 mg total) by mouth daily.     ELIQUIS 5 MG TABS tablet TAKE 1 TABLET BY MOUTH TWICE A DAY 60 tablet 3   ibandronate (BONIVA) 150 MG tablet Take 150 mg by mouth every 30 (thirty) days. 3rd of each month     memantine (NAMENDA) 5 MG tablet TAKE 1 TABLET (5 MG AT NIGHT) FOR 2 WEEKS, THEN INCREASE TO 1 TABLET (5 MG) TWICE A DAY 180 tablet 4   metoprolol tartrate (LOPRESSOR) 25 MG tablet Take 0.5 tablets (12.5 mg total) by mouth 2 (two) times daily. Hold for SBP <105 and HR <60 60 tablet 3   omeprazole (PRILOSEC) 20 MG capsule TAKE 1 CAPSULE BY MOUTH EVERY DAY (Patient taking  differently: Take 20 mg by mouth as needed.) 90 capsule 1   rOPINIRole (REQUIP) 1 MG tablet Take 1 mg by mouth 2 (two) times daily.     traMADol (ULTRAM) 50 MG tablet Take 1 tablet (50 mg total) by mouth every 6 (six) hours as needed (pain (max 3 tablets/24 hours)). 16 tablet 0   traZODone (DESYREL) 100 MG tablet Take 50 mg by mouth at bedtime.     No current facility-administered medications for this visit.    Allergies  Allergen Reactions   Gabapentin Other (See Comments)    crazy   Tylenol [Acetaminophen] Other (See Comments)    Hurts his kidneys    Diagnoses:   F33.1 major depressive affective disorder, recurrent, moderate  Plan of Care: The patient is a 71 year old Caucasian male who was referred due to experiencing depressive symptoms. The patient lives with his wife. The patient meets criteria for a diagnosis of  F33.1 major depressive affective disorder, recurrent, moderate based off of the following: feeling down, sad, tearful, rumination of thoughts, social isolation, fatigue, avoiding pleasurable activities, low self-esteem, and thoughts of hopelessness. He denied suicidal and homicidal ideation.   The patient stated that he wants to process his life.  This psychologist makes the recommendation if possible to participate in therapy bi-weekly if possible.    Hilbert Corrigan, PsyD

## 2021-12-01 NOTE — Progress Notes (Signed)
                Reco Shonk, PsyD 

## 2021-12-01 NOTE — Plan of Care (Signed)

## 2021-12-04 NOTE — Progress Notes (Signed)
71 y.o. male referred for bladder calculus in May 2023 however when reviewing fax notes he has been followed by [facility] and underwent cystolitholapaxy by Dr. Cain Sieve.  He has urinary retention on CIC and his cystolitholapaxy and was specifically referred to Dr. Diamantina Providence discuss HoLEP.  A follow-up appointment will be made with either Dr. Erlene Quan or Dr. Diamantina Providence for new patient visit and to discuss HoLEP

## 2021-12-06 ENCOUNTER — Telehealth: Payer: Self-pay | Admitting: Urology

## 2021-12-06 NOTE — Telephone Encounter (Signed)
Called patient to schedule an appt  Left message for him to return my call. Please have him speak with me. If I am not available he needs an appt to see Dr. Diamantina Providence to discuss HoLEP Surgery per Dr. Bernardo Heater. Next available.   Thanks, Sharyn Lull

## 2021-12-29 ENCOUNTER — Ambulatory Visit: Payer: Medicare Other | Admitting: Psychologist

## 2022-01-05 ENCOUNTER — Encounter: Payer: Self-pay | Admitting: Urology

## 2022-01-05 ENCOUNTER — Ambulatory Visit: Payer: Medicare Other | Admitting: Urology

## 2022-01-05 ENCOUNTER — Other Ambulatory Visit: Payer: Self-pay | Admitting: Urology

## 2022-01-05 VITALS — BP 112/75 | HR 50 | Ht 72.0 in | Wt 175.0 lb

## 2022-01-05 DIAGNOSIS — N401 Enlarged prostate with lower urinary tract symptoms: Secondary | ICD-10-CM

## 2022-01-05 DIAGNOSIS — N138 Other obstructive and reflux uropathy: Secondary | ICD-10-CM | POA: Diagnosis not present

## 2022-01-05 DIAGNOSIS — N200 Calculus of kidney: Secondary | ICD-10-CM

## 2022-01-05 DIAGNOSIS — R339 Retention of urine, unspecified: Secondary | ICD-10-CM | POA: Diagnosis not present

## 2022-01-05 DIAGNOSIS — N210 Calculus in bladder: Secondary | ICD-10-CM

## 2022-01-05 LAB — URINALYSIS, COMPLETE
Bilirubin, UA: NEGATIVE
Glucose, UA: NEGATIVE
Ketones, UA: NEGATIVE
Nitrite, UA: POSITIVE — AB
Specific Gravity, UA: 1.025 (ref 1.005–1.030)
Urobilinogen, Ur: 0.2 mg/dL (ref 0.2–1.0)
pH, UA: 5.5 (ref 5.0–7.5)

## 2022-01-05 LAB — MICROSCOPIC EXAMINATION: WBC, UA: >30 /hpf — AB (ref 0–5)

## 2022-01-05 NOTE — Patient Instructions (Signed)

## 2022-01-05 NOTE — H&P (View-Only) (Signed)
01/05/22 4:05 PM   Carlos Short. 02-Jul-1950 [id number]  CC: BPH with retention  HPI: 71 year old male with a number of comorbidities including dementia who has BPH with approximately 60 g prostate on recent CT that has been managed with intermittent catheterization 3-4 times a day over the last year.  He also had a bladder stone removed with cystolitholapaxy with a urologist in [place].  He was referred to consider HOLEP to resume spontaneous voiding.  He has some nonobstructive left renal stones, denies any left-sided flank pain.  He is also had some problems with urinary tract infections.  PSA was normal at 3.1 in April 2022.  His wife helps with most of the history with his dementia.   PMH: Past Medical History:  Diagnosis Date   Anxiety    Depression    Dysrhythmia 01/2021   RBBB, frequent PVCs   History of kidney stones    Hyperlipidemia    Hypertension    Hypertrophy of prostate with urinary obstruction and other lower urinary tract symptoms (LUTS)    Insomnia, unspecified    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 ([place]) 01/29/2021   in hospital with sepsis   Memory difficulties    Mood disorder ([place])    Bipolar versus schizophrenia   Other chronic pain    neuropathy pain rt foot post op hip surg   Restless leg syndrome    Wears glasses    Wears partial dentures    top and bottom partial    Surgical History: Past Surgical History:  Procedure Laterality Date   CYSTOSCOPY WITH LITHOLAPAXY N/A 05/03/2021   Procedure: CYSTOSCOPY WITH LITHOLAPAXY holmium laser;  Surgeon: Vira Agar, MD;  [facility];  Service: Urology;  Laterality: N/A;   HIP ARTHROPLASTY Right 2005   INGUINAL HERNIA REPAIR Bilateral 08/16/2012   Procedure: LAPAROSCOPIC BILATERAL INGUINAL HERNIA REPAIR;  Surgeon: Imogene Burn. Georgette Dover, MD;  Location: [place];  Service: General;  Laterality: Bilateral;   INSERTION OF MESH Bilateral 08/16/2012   Procedure: INSERTION OF MESH;   Surgeon: Imogene Burn. Georgette Dover, MD;  Location: [place];  Service: General;  Laterality: Bilateral;   MULTIPLE TOOTH EXTRACTIONS     TONSILLECTOMY     age 47   UPPER GI ENDOSCOPY       Family History: Family History  Problem Relation Age of Onset   Depression Mother    Hypertension Father    Heart attack Father 40       2 HEART ATTACKS   Kidney cancer Father    Lupus Father    Heart disease Brother    Depression Brother    Healthy Half-Brother    Migraines Neg Hx     Social History:  reports that he has been smoking cigarettes. He has a 12.50 pack-year smoking history. He has never used smokeless tobacco. He reports that he does not currently use alcohol. He reports current drug use. Drug: Marijuana.  Physical Exam: BP 112/75   Pulse (!) 50   Ht 6' (1.829 m)   Wt 175 lb (79.4 kg)   BMI 23.73 kg/m    Constitutional:  Alert and oriented, No acute distress. Cardiovascular: No clubbing, cyanosis, or edema. Respiratory: Normal respiratory effort, no increased work of breathing. GI: Abdomen is soft, nontender, nondistended, no abdominal masses   Laboratory Data:   Pertinent Imaging: I have personally viewed and interpreted the CT from February 2023 showing a 2 cm bladder stone(since removed in [place]), left nonobstructive  renal stones, prostate measured 60 g.  Assessment & Plan:   71 year old male with BPH managed with intermittent catheterization 3-4 times daily, history of bladder stone with cystolitholapaxy in [facility] in February, here today to discuss HOLEP.  We discussed at length the difference between atonic bladder and BPH, but that HOLEP would be the best opportunity to allow him to resume spontaneous voiding.  We discussed less than 10% chance of residual retention requiring catheterization after HOLEP.  We discussed the risks and benefits of HoLEP at length.  The procedure requires general anesthesia and takes 1 to 2 hours, and a holmium laser  is used to enucleate the prostate and push this tissue into the bladder.  A morcellator is then used to remove this tissue, which is sent for pathology.  The vast majority(>95%) of patients are able to discharge the same day with a catheter in place for 2 to 3 days, and will follow-up in clinic for a voiding trial.  We specifically discussed the risks of bleeding, infection, retrograde ejaculation, temporary urgency and urge incontinence, very low risk of long-term incontinence, urethral stricture/bladder neck contracture, pathologic evaluation of prostate tissue and possible detection of prostate cancer or other malignancy, and possible need for additional procedures.  Urine sent for culture today Schedule HOLEP   Nickolas Madrid, MD 01/05/2022  [facility] [address], [place], [place] [address] [phone]

## 2022-01-05 NOTE — Progress Notes (Signed)
01/05/22 4:05 PM   Carlos Short. 02-Jul-1950 [id number]  CC: BPH with retention  HPI: 71 year old male with a number of comorbidities including dementia who has BPH with approximately 60 g prostate on recent CT that has been managed with intermittent catheterization 3-4 times a day over the last year.  He also had a bladder stone removed with cystolitholapaxy with a urologist in [place].  He was referred to consider HOLEP to resume spontaneous voiding.  He has some nonobstructive left renal stones, denies any left-sided flank pain.  He is also had some problems with urinary tract infections.  PSA was normal at 3.1 in April 2022.  His wife helps with most of the history with his dementia.   PMH: Past Medical History:  Diagnosis Date   Anxiety    Depression    Dysrhythmia 01/2021   RBBB, frequent PVCs   History of kidney stones    Hyperlipidemia    Hypertension    Hypertrophy of prostate with urinary obstruction and other lower urinary tract symptoms (LUTS)    Insomnia, unspecified    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 ([place]) 01/29/2021   in hospital with sepsis   Memory difficulties    Mood disorder ([place])    Bipolar versus schizophrenia   Other chronic pain    neuropathy pain rt foot post op hip surg   Restless leg syndrome    Wears glasses    Wears partial dentures    top and bottom partial    Surgical History: Past Surgical History:  Procedure Laterality Date   CYSTOSCOPY WITH LITHOLAPAXY N/A 05/03/2021   Procedure: CYSTOSCOPY WITH LITHOLAPAXY holmium laser;  Surgeon: Vira Agar, MD;  [facility];  Service: Urology;  Laterality: N/A;   HIP ARTHROPLASTY Right 2005   INGUINAL HERNIA REPAIR Bilateral 08/16/2012   Procedure: LAPAROSCOPIC BILATERAL INGUINAL HERNIA REPAIR;  Surgeon: Imogene Burn. Georgette Dover, MD;  Location: [facility];  Service: General;  Laterality: Bilateral;   INSERTION OF MESH Bilateral 08/16/2012   Procedure: INSERTION OF MESH;   Surgeon: Imogene Burn. Georgette Dover, MD;  Location: [place];  Service: General;  Laterality: Bilateral;   MULTIPLE TOOTH EXTRACTIONS     TONSILLECTOMY     age 47   UPPER GI ENDOSCOPY       Family History: Family History  Problem Relation Age of Onset   Depression Mother    Hypertension Father    Heart attack Father 40       2 HEART ATTACKS   Kidney cancer Father    Lupus Father    Heart disease Brother    Depression Brother    Healthy Half-Brother    Migraines Neg Hx     Social History:  reports that he has been smoking cigarettes. He has a 12.50 pack-year smoking history. He has never used smokeless tobacco. He reports that he does not currently use alcohol. He reports current drug use. Drug: Marijuana.  Physical Exam: BP 112/75   Pulse (!) 50   Ht 6' (1.829 m)   Wt 175 lb (79.4 kg)   BMI 23.73 kg/m    Constitutional:  Alert and oriented, No acute distress. Cardiovascular: No clubbing, cyanosis, or edema. Respiratory: Normal respiratory effort, no increased work of breathing. GI: Abdomen is soft, nontender, nondistended, no abdominal masses   Laboratory Data:   Pertinent Imaging: I have personally viewed and interpreted the CT from February 2023 showing a 2 cm bladder stone(since removed in [place]), left nonobstructive  renal stones, prostate measured 60 g.  Assessment & Plan:   71 year old male with BPH managed with intermittent catheterization 3-4 times daily, history of bladder stone with cystolitholapaxy in [facility] in February, here today to discuss HOLEP.  We discussed at length the difference between atonic bladder and BPH, but that HOLEP would be the best opportunity to allow him to resume spontaneous voiding.  We discussed less than 10% chance of residual retention requiring catheterization after HOLEP.  We discussed the risks and benefits of HoLEP at length.  The procedure requires general anesthesia and takes 1 to 2 hours, and a holmium laser  is used to enucleate the prostate and push this tissue into the bladder.  A morcellator is then used to remove this tissue, which is sent for pathology.  The vast majority(>95%) of patients are able to discharge the same day with a catheter in place for 2 to 3 days, and will follow-up in clinic for a voiding trial.  We specifically discussed the risks of bleeding, infection, retrograde ejaculation, temporary urgency and urge incontinence, very low risk of long-term incontinence, urethral stricture/bladder neck contracture, pathologic evaluation of prostate tissue and possible detection of prostate cancer or other malignancy, and possible need for additional procedures.  Urine sent for culture today Schedule HOLEP   Nickolas Madrid, MD 01/05/2022  [facility] [address] [place],  [address] [phone]

## 2022-01-05 NOTE — Progress Notes (Signed)
Surgical Physician Order Form [facility] [place]  * Scheduling expectation :  Next available  *Length of Case: 2 hours  *Clearance needed: no  *Anticoagulation Instructions: Hold all anticoagulants  *Aspirin Instructions: Hold Aspirin and Plavix  *Post-op visit Date/Instructions:  1-3 day voiding trial  *Diagnosis: BPH w/urinary obstruction  *Procedure:     HOLEP (64680)   Additional orders: N/A  -Admit type: OUTpatient  -Anesthesia: General  -VTE Prophylaxis Standing Order SCD's       Other:   -Standing Lab Orders Per Anesthesia    Lab other: UA&Urine Culture (sent 10/26)  -Standing Test orders EKG/Chest x-ray per Anesthesia       Test other:   - Medications:  Ancef 2gm IV  -Other orders:  N/A

## 2022-01-09 ENCOUNTER — Telehealth: Payer: Self-pay | Admitting: *Deleted

## 2022-01-09 ENCOUNTER — Telehealth: Payer: Self-pay

## 2022-01-09 LAB — CULTURE, URINE COMPREHENSIVE

## 2022-01-09 NOTE — Progress Notes (Signed)
[facility] Posting Form   Surgery Date/Time: Date: 01/20/2022  Surgeon: Dr. Nickolas Madrid, MD  Surgery Location: Day Surgery  Inpt ( No  )   Outpt (Yes)   Obs ( No  )   Diagnosis: N40.1, N13.8 Benign Prostatic Hyperplasia with Urinary Obstruction  -CPT: 56387  Surgery: Holmium Laser Enucleation of the Prostate  Stop Anticoagulations: Yes  Cardiac/Medical/Pulmonary Clearance needed: YES  Clearance needed from Dr: [facility]  Clearance request sent on: Date: 01/09/22   *Orders entered into EPIC  Date: 01/09/22   *Case booked in EPIC  Date: 01/05/2022  *Notified pt of Surgery: Date: 01/05/2022  PRE-OP UA & CX: yes, obtained in clinic on 01/05/2022  *Placed into Prior Authorization Work Fabio Bering Date: 01/09/22   Assistant/laser/rep:No

## 2022-01-09 NOTE — Telephone Encounter (Signed)
I spoke with [name] and his wife while in clinic. We have discussed possible surgery dates and Friday November 10th, 2023 was agreed upon by all parties. Patient given information about surgery date, what to expect pre-operatively and post operatively.  We discussed that a Pre-Admission Testing office will be calling to set up the pre-op visit that will take place prior to surgery, and that these appointments are typically done over the phone with a Pre-Admissions RN.  Informed patient that our office will communicate any additional care to be provided after surgery. Patients questions or concerns were discussed during our call. Advised to call our office should there be any additional information, questions or concerns that arise. Patient verbalized understanding.

## 2022-01-09 NOTE — Progress Notes (Addendum)
REQUEST FOR SURGICAL CLEARANCE       Date: Date: 01/09/2022  [facility]ed to[facility]  Surgeon: Dr. Nickolas Madrid, MD     Date of Surgery: 01/20/2022  Operation: Holmium Laser Enucleation of the Prostate  Anesthesia Type: General   Diagnosis: Benign Prostatic Hyperplasia with Urinary Obstruction  Patient Requires:   Cardiac / Vascular Clearance : Yes  Reason: Will need patient to hold Eliquis prior to surgery, How long should he hold it? Is this patient optimized for surgery.    Risk Assessment:    Ritesh Opara. is at low risk, from a cardiac standpoint, for his upcoming procedure: Holmium Laser Enucleation of the Prostate.  It is ok to proceed without further cardiac testing.   If applicable can hold Eliquis for 2  day(s) prior to procedure and re-start 3  days post procedure.   Please call Dept: [phone] with any additional questions.   Adrian Prows, MD, [facility] 01/09/2022, 6:48 PM Office: [phone] Fax: [phone] Pager: [phone]

## 2022-01-09 NOTE — Telephone Encounter (Signed)
   Pre-operative Risk Assessment    Patient Name: Carlos Short.  DOB: [birth year] MRN: [id number]      Request for Surgical Clearance    Procedure:   HOLMIUM LASER ENUCLEATION OF THE PROSTATE  Date of Surgery:  Clearance 01/20/22                                 Surgeon:  DR. Nickolas Madrid Surgeon's Group or Practice Name:  [place] Phone number:  [phone] Fax number:  [phone]   Type of Clearance Requested:   - Medical  - Pharmacy:  Hold Apixaban (Eliquis)     Type of Anesthesia:  General    Additional requests/questions:    Jiles Prows   01/09/2022, 3:30 PM

## 2022-01-10 NOTE — Telephone Encounter (Signed)
Patient with diagnosis of PAF on Eliquis for anticoagulation.    Procedure:  HOLMIUM LASER ENUCLEATION OF THE PROSTATE   Date of procedure: 01/20/22   CHA2DS2-VASc Score = 4  This indicates a 4.8% annual risk of stroke. The patient's score is based upon: CHF History: 1 HTN History: 1 Diabetes History: 0 Stroke History: 0 Vascular Disease History: 1 Age Score: 1 Gender Score: 0   CrCl 69 mL/min Platelet count 178K  Per office protocol, patient can hold Eliquis for 3 days prior to procedure.    **This guidance is not considered finalized until pre-operative APP has relayed final recommendations.**

## 2022-01-10 NOTE — Telephone Encounter (Signed)
I tried to call the pt to confirm per the notes pre op provider today. There was no answer or vm that came on. I will send notes as FYI to requesting office that they may be following our office just for EP and following [facility] as his primary card. They may want to try and reach the pt as well to confirm this. If he is following [employer] as primary then clearance needs to come from their office.

## 2022-01-11 ENCOUNTER — Telehealth: Payer: Self-pay

## 2022-01-11 MED ORDER — SULFAMETHOXAZOLE-TRIMETHOPRIM 800-160 MG PO TABS: 1.0000 | ORAL_TABLET | Freq: Two times a day (BID) | ORAL | 0 refills | Status: DC

## 2022-01-11 NOTE — Telephone Encounter (Signed)
[facility] faxed over a clearance form yesterday. He has been cleared for surgery and to hold Eliquis for 2 days prior.

## 2022-01-11 NOTE — Telephone Encounter (Signed)
I will update our pre op provider as FYI.  Will remove from the pre op call back pool.

## 2022-01-11 NOTE — Telephone Encounter (Signed)
Spoke with pt and patients wife. Will send in medication to pharmacy.

## 2022-01-11 NOTE — Telephone Encounter (Signed)
-----   Message from Billey Co, MD sent at 01/10/2022  8:26 AM EDT ----- Please start Bactrim DS twice daily x7 days to sterilize the urine prior to upcoming HOLEP surgery  Nickolas Madrid, MD 01/10/2022

## 2022-01-12 ENCOUNTER — Encounter
Admission: RE | Admit: 2022-01-12 | Discharge: 2022-01-12 | Disposition: A | Discharge status: Home / Self Care | Payer: Medicare Other | Source: Ambulatory Visit | Attending: Urology | Admitting: Urology

## 2022-01-12 ENCOUNTER — Other Ambulatory Visit: Payer: Self-pay

## 2022-01-12 NOTE — Patient Instructions (Addendum)
Your procedure is scheduled on: 01/20/22 - Friday Report to the Registration Desk on the 1st floor of the [facility]. To find out your arrival time, please call [phone] between 1PM - 3PM on: 01/19/22 - Thursday If your arrival time is 6:00 am, do not arrive prior to that time as the [facility] entrance doors do not open until 6:00 am.  REMEMBER: Instructions that are not followed completely may result in serious medical risk, up to and including death; or upon the discretion of your surgeon and anesthesiologist your surgery may need to be rescheduled.  Do not eat food or drink any fluids after midnight the night before surgery.  No gum chewing, lozengers or hard candies.   TAKE THESE MEDICATIONS THE MORNING OF SURGERY WITH A SIP OF WATER:  - doxazosin (CARDURA)  - memantine (NAMENDA)  - metoprolol tartrate (LOPRESSOR)  - rOPINIRole (REQUIP)   HOLD Eliquis 2 days prior to surgery procedure.  Hold Aspirin 325 mg 5 days prior to your procedure.  One week prior to surgery: Stop Anti-inflammatories (NSAIDS) such as Advil, Aleve, Ibuprofen, Motrin, Naproxen, Naprosyn and Aspirin based products such as Excedrin, Goodys Powder, BC Powder.  Stop ANY OVER THE COUNTER supplements until after surgery.  You may however, continue to take Tylenol if needed for pain up until the day of surgery.  No Alcohol for 24 hours before or after surgery.  No Smoking including e-cigarettes for 24 hours prior to surgery.  No chewable tobacco products for at least 6 hours prior to surgery.  No nicotine patches on the day of surgery.  Do not use any "recreational" drugs for at least a week prior to your surgery.  Please be advised that the combination of cocaine and anesthesia may have negative outcomes, up to and including death. If you test positive for cocaine, your surgery will be cancelled.  On the morning of surgery brush your teeth with toothpaste and water, you may rinse your mouth with  mouthwash if you wish. Do not swallow any toothpaste or mouthwash.  Do not wear jewelry, make-up, hairpins, clips or nail polish.  Do not wear lotions, powders, or perfumes.   Do not shave body from the neck down 48 hours prior to surgery just in case you cut yourself which could leave a site for infection.  Also, freshly shaved skin may become irritated if using the CHG soap.  Contact lenses, hearing aids and dentures may not be worn into surgery.  Do not bring valuables to the hospital. [facility] is not responsible for any missing/lost belongings or valuables.   Notify your doctor if there is any change in your medical condition (cold, fever, infection).  Wear comfortable clothing (specific to your surgery type) to the hospital.  After surgery, you can help prevent lung complications by doing breathing exercises.  Take deep breaths and cough every 1-2 hours. Your doctor may order a device called an Incentive Spirometer to help you take deep breaths. When coughing or sneezing, hold a pillow firmly against your incision with both hands. This is called "splinting." Doing this helps protect your incision. It also decreases belly discomfort.  If you are being admitted to the hospital overnight, leave your suitcase in the car. After surgery it may be brought to your room.  If you are being discharged the day of surgery, you will not be allowed to drive home. You will need a responsible adult (18 years or older) to drive you home and stay with  you that night.   If you are taking public transportation, you will need to have a responsible adult (18 years or older) with you. Please confirm with your physician that it is acceptable to use public transportation.   Please call the [place] Dept. at [phone] if you have any questions about these instructions.  Surgery Visitation Policy:  Patients undergoing a surgery or procedure may have two family members or support  persons with them as long as the person is not COVID-19 positive or experiencing its symptoms.   Inpatient Visitation:    Visiting hours are 7 a.m. to 8 p.m. Up to four visitors are allowed at one time in a patient room, including children. The visitors may rotate out with other people during the day. One designated support person (adult) may remain overnight.

## 2022-01-16 ENCOUNTER — Other Ambulatory Visit: Payer: Self-pay

## 2022-01-16 ENCOUNTER — Encounter: Payer: Self-pay | Admitting: Urology

## 2022-01-16 NOTE — Progress Notes (Signed)
Perioperative Services  Pre-Admission/Anesthesia Testing Clinical Review  Date: 01/16/22  Patient Demographics:  Name: Carlos Short. DOB:   11-Apr-1950 MRN:   [id number]  Planned Surgical Procedure(s):    Case: [id number] Date/Time: 01/20/22 0923   Procedure: HOLEP-LASER ENUCLEATION OF THE PROSTATE WITH MORCELLATION   Anesthesia type: General   Pre-op diagnosis: Benign Prostatic Hyperplasia with Urinary Obstruction   Location: [facility] OR ROOM 10 / [place] [facility]S FOR ANESTHESIA GROUP   Surgeons: Billey Co, MD   NOTE: Available PAT nursing documentation and vital signs have been reviewed. Clinical nursing staff has updated patient's PMH/PSHx, current medication list, and drug allergies/intolerances to ensure comprehensive history available to assist in medical decision making as it pertains to the aforementioned surgical procedure and anticipated anesthetic course. Extensive review of available clinical information performed. [place] PMH and PSHx updated with any diagnoses/procedures that  may have been inadvertently omitted during his intake with the pre-admission testing department's nursing staff.  Clinical Discussion:  Carlos Elizalde. is a 71 y.o. male who is submitted for pre-surgical anesthesia review and clearance prior to him undergoing the above procedure. Patient is a Current Smoker (12.5 pack years). Pertinent PMH includes: PAF, CHF, infrarenal AAA, NSVT, SVT, aortic atherosclerosis, frequent PVCs, RBBB, HTN, HLD, COPD, hiatal hernia, RLS, nephrolithiasis, mood disorder (bipolar versus schizophrenia), anxiety, insomnia, depression, memory difficulties.  Patient is followed by cardiology Clayborne Dana, MD). He was last seen in the cardiology clinic on 08/05/2021; notes reviewed.  At the time of his clinic visit, patient doing well overall from a cardiovascular perspective.  He denied any episodes of chest pain, PND, orthopnea, significant peripheral edema, vertiginous  symptoms, or presyncope/syncope.  Patient with fatigue, palpitations, and chronic shortness of breath related to his underlying COPD diagnosis.  Patient with a past medical history significant for cardiovascular diagnoses.  Patient found to have an infrarenal AAA back in 09/2018, at which time CT imaging measured aneurysmal defect at 4.2 x 4.2 cm.  Since that time, aneurysmal size has been monitored via both CT and ultrasound.  Defect last measured on a CT renal scan performed on 04/19/2021, at which time there has been interval increase to 4.6 cm.  Myocardial perfusion imaging study performed on 02/01/2021 revealing a mildly decreased left ventricular systolic function with an EF of 49%.  Atrial bigeminy with occasional PVCs noted at rest and with stress.  There was a mild fixed perfusion abnormality in the inferior region consistent with diaphragmatic attenuation.  Minimal ischemia in this region cannot be excluded.  Most recent TTE was performed on 02/01/2021 revealing a mildly reduced left ventricular systolic function with an EF of 40%.  There was moderate global hypertrophy. Left ventricular diastolic Doppler parameters consistent with pseudonormalization (G2DD).  Left atrial cavity mildly dilated.  There was moderate tricuspid and mild pulmonic valve regurgitation.  There is no evidence of any significant transvalvular gradients to suggest stenosis.  There was no evidence of pulmonary hypertension.  Long-term cardiac event monitor study performed on 06/01/2021 revealed a predominant underlying sinus rhythm.  Episodes of asymptomatic NSVT noted.  Additionally, there were episodes of SVT, which were also symptomatic, with the longest lasting 20 beats.  There were frequent PACs with a 29.3% burden, frequent PVCs with a 5.6% burden, and both ventricular bigeminy and trigeminy present.  Patient with a paroxysmal atrial fibrillation diagnosis; CHA2DS2-VASc Score = 4 (age, CHF, HTN, vascular disease  history). His rate and rhythm are currently being maintained on oral dronedarone + metoprolol tartrate.  He is chronically anticoagulated using apixaban; reported to be compliant with therapy with no evidence or reports of GI bleeding.  Blood pressure well controlled at 102/60 mmHg on currently prescribed alpha-blocker (doxazosin) and beta-blocker (metoprolol tartrate) therapies.  Patient is not currently to be any type of lipid-lowering therapies for his HLD diagnosis or ASCVD prevention.  He is not diabetic. Patient does not have an OSAH diagnosis. Functional capacity, as defined by DASI, is documented as being >/= 4 METS.  Given patient's increase and both atrial and ventricular ectopy, Dron Adderall was discontinued and patient was started on amiodarone.  No other changes were made to his medication regimen.  Patient follow-up with outpatient cardiology/electrophysiology in 3 months or sooner if needed.  Carlos Schimke. is scheduled for a HOLEP-LASER ENUCLEATION OF THE PROSTATE WITH MORCELLATION on 01/20/2022 with Dr. Nickolas Madrid, MD.  Given patient's past medical history significant for cardiovascular diagnoses, presurgical cardiac clearance was sought by the PAT team. Per cardiology, "this patient is optimized for surgery and may proceed with the planned procedural course with a ACCEPTABLE risk of significant perioperative cardiovascular complications".  Again, this patient is on daily anticoagulation and antiplatelet therapies.  He has been instructed on recommendations from his cardiologist for holding his apixaban for 2 days (last dose 01/17/2022) and his full dose aspirin 5 days (last dose 01/14/2022) prior to his procedure with plans to restart as soon as postoperative bleeding risk felt to be minimized by his primary attending surgeon.  Patient denies previous perioperative complications with anesthesia in the past. In review of the available records, it is noted that patient underwent a  general anesthetic course at [facility]  (ASA III) in 04/2021 without documented complications.      01/05/2022    3:13 PM 11/30/2021    2:27 PM 11/23/2021    9:34 AM  Vitals with BMI  Height 6\' 0"  6\' 0"  6\' 0"   Weight 175 lbs 171 lbs 171 lbs 6 oz  BMI 23.73 [id number]  Systolic [id number]  Diastolic 75 73 74  Pulse 50 83 56    Providers/Specialists:   NOTE: Primary physician provider listed below. Patient may have been seen by APP or partner within same practice.   PROVIDER ROLE / SPECIALTY LAST Ranae Pila, MD Urology (Surgeon) 01/05/2022  Nickola Major, NP Primary Care Provider 08/30/2021  Adrian Prows, MD Cardiology 05/18/2021  Cristopher Peru, MD Electrophysiology 08/05/2021   Allergies:  Gabapentin and Tylenol [acetaminophen]  Current Home Medications:   No current facility-administered medications for this encounter.    amiodarone (PACERONE) 200 MG tablet   aspirin EC 325 MG tablet   doxazosin (CARDURA) 8 MG tablet   ELIQUIS 5 MG TABS tablet   ibandronate (BONIVA) 150 MG tablet   memantine (NAMENDA) 5 MG tablet   metoprolol tartrate (LOPRESSOR) 25 MG tablet   OVER THE COUNTER MEDICATION   rOPINIRole (REQUIP) 1 MG tablet   sulfamethoxazole-trimethoprim (BACTRIM DS) 800-160 MG tablet   traMADol (ULTRAM) 50 MG tablet   traZODone (DESYREL) 100 MG tablet   History:   Past Medical History:  Diagnosis Date   Anxiety    Aortic atherosclerosis (HCC)    CHF (congestive heart failure) ([place]) 02/01/2021   a.) TTE 02/01/2021: EF 40%, mild LVH, mild LAE, midl PR, mod TR, G2DD.   COPD (chronic obstructive pulmonary disease) (HCC)    Depression    Diverticulosis    Frequent PVCs    Hiatal hernia  History of kidney stones    Hyperlipidemia    Hypertension    Hypertrophy of prostate with urinary obstruction and other lower urinary tract symptoms (LUTS)    Infrarenal abdominal aortic aneurysm (AAA) without rupture ([place]) 09/24/2018   a.) CTA  CAP 09/24/2018: 4.2 x 4.2 cm; b.) CT abd abd 05/29/2019: 4.0 cm; c.) aorta duplex 12/30/2020: 4.14 x 4.08 x 4.19 cm; d.) CT abd/pel 02/21/2021: 4.6 cm; e.) CT renal 04/01/2021: 4.3 cm; f.) CT renal 04/19/2021: 4.6 cm   Insomnia, unspecified    a.) on trazodone PRN   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 ([place]) 01/29/2021   in hospital with sepsis   Long term current use of amiodarone    Long term current use of anticoagulant    a.) apixaban   Memory difficulties    a.) on memantine   Mood disorder (HCC)    Bipolar versus schizophrenia   Neuropathy of right foot    a.) s/p hip surgery   Osteoporosis    a.) on oral bisphosphonate   PAF (paroxysmal atrial fibrillation) ([place])    a.) holter 07/25/2020: NSR, PAF/flutter (14% burden) with longest lasting 45 minutes, freq PACs (24.6% burden); b.) holter 06/01/2021: NSR, episodes of NSVT, SVT (longest lasting 20 beats), freq PACs (29.3% burden), PVCs (2.6% burden), vent bi/trigeminy   RBBB (right bundle branch block)    Restless leg syndrome    a.) on ropinirole   Wears glasses    Wears partial dentures    top and bottom partial   Past Surgical History:  Procedure Laterality Date   CYSTOSCOPY WITH LITHOLAPAXY N/A 05/03/2021   Procedure: CYSTOSCOPY WITH LITHOLAPAXY holmium laser;  Surgeon: Vira Agar, MD;  Location: [facility]S;  Service: Urology;  Laterality: N/A;   HIP ARTHROPLASTY Right 2005   INGUINAL HERNIA REPAIR Bilateral 08/16/2012   Procedure: LAPAROSCOPIC BILATERAL INGUINAL HERNIA REPAIR;  Surgeon: Imogene Burn. Georgette Dover, MD;  Location: [facility];  Service: General;  Laterality: Bilateral;   INSERTION OF MESH Bilateral 08/16/2012   Procedure: INSERTION OF MESH;  Surgeon: Imogene Burn. Georgette Dover, MD;  Location: [facility];  Service: General;  Laterality: Bilateral;   MULTIPLE TOOTH EXTRACTIONS     TONSILLECTOMY     age 55   UPPER GI ENDOSCOPY     Family History  Problem Relation Age of Onset   Depression Mother     Hypertension Father    Heart attack Father 5       2 HEART ATTACKS   Kidney cancer Father    Lupus Father    Heart disease Brother    Depression Brother    Healthy Half-Brother    Migraines Neg Hx    Social History   Tobacco Use   Smoking status: Every Day    Packs/day: 0.25    Years: 50.00    Total pack years: 12.50    Types: Cigarettes   Smokeless tobacco: Never  Vaping Use   Vaping Use: Never used  Substance Use Topics   Alcohol use: Not Currently    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  Lab Results  Component Value Date   WBC 20.7 (H) 11/13/2021   HGB 13.7 11/13/2021   HCT 40.4 11/13/2021   MCV 88.8 11/13/2021   PLT  178 11/13/2021   Lab Results  Component Value Date   NA 136 11/13/2021   K 3.4 (L) 11/13/2021   CO2 20 (L) 11/13/2021   GLUCOSE 141 (H) 11/13/2021   BUN 21 11/13/2021   CREATININE 1.11 11/13/2021   CALCIUM 8.6 (L) 11/13/2021   GFRNONAA >60 11/13/2021   Component Date Value Ref Range Status   Urine Culture, Comprehensive 01/05/2022 Final report   Final   Organism ID, Bacteria 01/05/2022 Comment   Final   Comment: More than 3 organisms recovered, none predominant. Please submit another culture if clinically indicated. Greater than 100,000 colony forming units per mL    Specific Gravity, UA 01/05/2022 1.025  1.005 - 1.030 Final   pH, UA 01/05/2022 5.5  5.0 - 7.5 Final   Color, UA 01/05/2022 Yellow  Yellow Final   Appearance Ur 01/05/2022 Cloudy (A)  Clear Final   Leukocytes,UA 01/05/2022 1+ (A)  Negative Final   Protein,UA 01/05/2022 Trace (A)  Negative/Trace Final   Glucose, UA 01/05/2022 Negative  Negative Final   Ketones, UA 01/05/2022 Negative  Negative Final   RBC, UA 01/05/2022 1+ (A)  Negative Final   Bilirubin, UA 01/05/2022 Negative  Negative  Final   Urobilinogen, Ur 01/05/2022 0.2  0.2 - 1.0 mg/dL Final   Nitrite, UA 01/05/2022 Positive (A)  Negative Final   Microscopic Examination 01/05/2022 See below:   Final   WBC, UA 01/05/2022 >30 (A)  0 - 5 /hpf Final   RBC, Urine 01/05/2022 3-10 (A)  0 - 2 /hpf Final   Epithelial Cells (non renal) 01/05/2022 0-10  0 - 10 /hpf Final   Bacteria, UA 01/05/2022 Many (A)  None seen/Few Final    ECG: Date: 11/23/2021 Time ECG obtained: 0926 AM Rate: 56 bpm Rhythm: sinus bradycardia Axis (leads I and aVF): Normal Intervals: PR 176 ms. QRS 126 ms. QTc 438 ms. ST segment and T wave changes: No evidence of acute ST segment elevation or depression Comparison: Similar to previous tracing obtained on 08/18/2021   IMAGING / PROCEDURES: LONG TERM CARDIAC EVENT MONITOR STUDY performed between 05/18/2021 and 10/26/2021 Predominant underlying rhythm was sinus.  Episodes reported as nonsustained ventricular tachycardia appear more consistent with SVT vs ventricular bigeminy.  Although there was episodes of asymptomatic nonsustained VT.  Episodes of SVT which were asymptomatic with the longed lasting 20 beats.  Frequent PACs with 29.3% burden.  Frequent PVCs with 5.6% burden.   Ventricular bigeminy and trigeminy were present.   CT RENAL STONE STUDY performed on 04/19/2021 Left nephrolithiasis.   No ureteral stones or hydronephrosis. Left renal parapelvic cysts, thought to represent hydronephrosis on prior study. 19 mm bladder stone. 4.6 cm infrarenal abdominal aortic aneurysm. Sigmoid diverticulosis. Small hiatal hernia.  TRANSTHORACIC ECHOCARDIOGRAM performed on 02/01/2021 Left ventricle cavity is normal in size.  Mild concentric hypertrophy of the left ventricle.  Moderate global hypokinesis with frequent ectopy.  LVEF around 40%.  Indeterminate diastolic filling pattern.  Left atrial cavity is mildly dilated.  Moderate (Grade II) mitral regurgitation.  Moderate tricuspid  regurgitation.  Mild pulmonic regurgitation.  No evidence of pulmonary hypertension.   MYOCARDIAL PERFUSION IMAGING STUDY (LEXISCAN) performed on 02/01/2021 Non-diagnostic ECG stress.  Atrial bigeminy, occasional PVC at rest and stress.  There is a fixed mild defect in the inferior region consistent with diaphragmatic attenuation. Minimal ischemia in this region cannot be excluded.   Overall LV systolic function is normal without regional wall motion abnormalities.  Calculated Stress LVEF: 49% but visually appears normal.  No previous exam available for comparison.   Impression and Plan:  Carlos Alyea. has been referred for pre-anesthesia review and clearance prior to him undergoing the planned anesthetic and procedural courses. Available labs, pertinent testing, and imaging results were personally reviewed by me. This patient has been appropriately cleared by cardiology with an overall ACCEPTABLE risk of significant perioperative cardiovascular complications.  Based on clinical review performed today (01/16/22), barring any significant acute changes in the patient's overall condition, it is anticipated that he will be able to proceed with the planned surgical intervention. Any acute changes in clinical condition may necessitate his procedure being postponed and/or cancelled. Patient will meet with anesthesia team (MD and/or CRNA) on the day of his procedure for preoperative evaluation/assessment. Questions regarding anesthetic course will be fielded at that time.   Pre-surgical instructions were reviewed with the patient during his PAT appointment and questions were fielded by PAT clinical staff. Patient was advised that if any questions or concerns arise prior to his procedure then he should return a call to PAT and/or his surgeon's office to discuss.  Honor Loh, MSN, APRN, FNP-C, CEN [facility]  Peri-operative Services Nurse Practitioner Phone: [phone] Fax: [phone] 01/16/22 11:52 AM  NOTE: This note has been prepared using Dragon dictation software. Despite my best ability to proofread, there is always the potential that unintentional transcriptional errors may still occur from this process.

## 2022-01-16 NOTE — Telephone Encounter (Signed)
Patient's wife called and requested refill on Tramadol 50mg  tablet. Take one tablet every 6 hours as needed. His med list says 16 tablets. Patient does not have contract on file.  Medication pended and sent to Durenda Age, NP for approval

## 2022-01-19 MED ORDER — CHLORHEXIDINE GLUCONATE 0.12 % MT SOLN: 15.0000 mL | Freq: Once | OROMUCOSAL | Status: AC

## 2022-01-19 MED ORDER — LACTATED RINGERS IV SOLN: INTRAVENOUS | Status: DC

## 2022-01-19 MED ORDER — ORAL CARE MOUTH RINSE: 15.0000 mL | Freq: Once | OROMUCOSAL | Status: AC

## 2022-01-19 MED ORDER — FAMOTIDINE 20 MG PO TABS: 20.0000 mg | ORAL_TABLET | Freq: Once | ORAL | Status: AC

## 2022-01-19 MED ORDER — CEFAZOLIN SODIUM-DEXTROSE 2-4 GM/100ML-% IV SOLN
2.0000 g | INTRAVENOUS | Status: AC
  Administered 2022-01-20: 2 g via INTRAVENOUS

## 2022-01-20 ENCOUNTER — Other Ambulatory Visit: Payer: Self-pay

## 2022-01-20 ENCOUNTER — Ambulatory Visit
Admission: RE | Admit: 2022-01-20 | Discharge: 2022-01-20 | Disposition: A | Discharge status: Home / Self Care | Payer: Medicare Other | Attending: Urology | Admitting: Urology

## 2022-01-20 ENCOUNTER — Ambulatory Visit: Payer: Medicare Other | Admitting: Urgent Care

## 2022-01-20 ENCOUNTER — Encounter: Payer: Self-pay | Admitting: Urology

## 2022-01-20 ENCOUNTER — Encounter
Admission: RE | Disposition: A | Discharge status: Home / Self Care | Payer: Self-pay | Source: Home / Self Care | Attending: Urology

## 2022-01-20 DIAGNOSIS — I110 Hypertensive heart disease with heart failure: Secondary | ICD-10-CM | POA: Insufficient documentation

## 2022-01-20 DIAGNOSIS — F17210 Nicotine dependence, cigarettes, uncomplicated: Secondary | ICD-10-CM | POA: Insufficient documentation

## 2022-01-20 DIAGNOSIS — I471 Supraventricular tachycardia, unspecified: Secondary | ICD-10-CM | POA: Diagnosis not present

## 2022-01-20 DIAGNOSIS — I7143 Infrarenal abdominal aortic aneurysm, without rupture: Secondary | ICD-10-CM | POA: Diagnosis not present

## 2022-01-20 DIAGNOSIS — I480 Paroxysmal atrial fibrillation: Secondary | ICD-10-CM | POA: Insufficient documentation

## 2022-01-20 DIAGNOSIS — G8929 Other chronic pain: Secondary | ICD-10-CM | POA: Diagnosis not present

## 2022-01-20 DIAGNOSIS — G2581 Restless legs syndrome: Secondary | ICD-10-CM | POA: Insufficient documentation

## 2022-01-20 DIAGNOSIS — Z7901 Long term (current) use of anticoagulants: Secondary | ICD-10-CM | POA: Diagnosis not present

## 2022-01-20 DIAGNOSIS — N401 Enlarged prostate with lower urinary tract symptoms: Secondary | ICD-10-CM | POA: Diagnosis not present

## 2022-01-20 DIAGNOSIS — J449 Chronic obstructive pulmonary disease, unspecified: Secondary | ICD-10-CM | POA: Insufficient documentation

## 2022-01-20 DIAGNOSIS — I700 Atherosclerosis of aorta: Secondary | ICD-10-CM | POA: Insufficient documentation

## 2022-01-20 DIAGNOSIS — N138 Other obstructive and reflux uropathy: Secondary | ICD-10-CM | POA: Diagnosis not present

## 2022-01-20 DIAGNOSIS — I509 Heart failure, unspecified: Secondary | ICD-10-CM | POA: Diagnosis not present

## 2022-01-20 DIAGNOSIS — R413 Other amnesia: Secondary | ICD-10-CM | POA: Diagnosis not present

## 2022-01-20 DIAGNOSIS — F0394 Unspecified dementia, unspecified severity, with anxiety: Secondary | ICD-10-CM | POA: Diagnosis not present

## 2022-01-20 DIAGNOSIS — E785 Hyperlipidemia, unspecified: Secondary | ICD-10-CM | POA: Diagnosis not present

## 2022-01-20 HISTORY — DX: Paroxysmal atrial fibrillation: I48.0

## 2022-01-20 HISTORY — DX: Atherosclerosis of aorta: I70.0

## 2022-01-20 HISTORY — DX: Diverticulosis of intestine, part unspecified, without perforation or abscess without bleeding: K57.90

## 2022-01-20 HISTORY — PX: HOLEP-LASER ENUCLEATION OF THE PROSTATE WITH MORCELLATION: SHX6641

## 2022-01-20 HISTORY — DX: Diaphragmatic hernia without obstruction or gangrene: K44.9

## 2022-01-20 HISTORY — DX: Ventricular premature depolarization: I49.3

## 2022-01-20 HISTORY — DX: Unspecified mononeuropathy of right lower limb: G57.91

## 2022-01-20 HISTORY — DX: Age-related osteoporosis without current pathological fracture: M81.0

## 2022-01-20 HISTORY — DX: Long term (current) use of anticoagulants: Z79.01

## 2022-01-20 HISTORY — DX: Other long term (current) drug therapy: Z79.899

## 2022-01-20 HISTORY — DX: Unspecified right bundle-branch block: I45.10

## 2022-01-20 HISTORY — DX: Chronic obstructive pulmonary disease, unspecified: J44.9

## 2022-01-20 LAB — PROTIME-INR
INR: 1.3 — ABNORMAL HIGH (ref 0.8–1.2)
Prothrombin Time: 15.7 seconds — ABNORMAL HIGH (ref 11.4–15.2)

## 2022-01-20 SURGERY — ENUCLEATION, PROSTATE, USING LASER, WITH MORCELLATION
Anesthesia: General | Site: Prostate

## 2022-01-20 MED ORDER — PHENYLEPHRINE HCL (PRESSORS) 10 MG/ML IV SOLN
INTRAVENOUS | Status: DC | PRN
  Administered 2022-01-20 (×2): 80 ug via INTRAVENOUS

## 2022-01-20 MED ORDER — CEFAZOLIN SODIUM-DEXTROSE 2-4 GM/100ML-% IV SOLN
INTRAVENOUS | Status: AC
  Filled 2022-01-20: qty 100

## 2022-01-20 MED ORDER — FENTANYL CITRATE (PF) 100 MCG/2ML IJ SOLN
INTRAMUSCULAR | Status: AC
  Filled 2022-01-20: qty 2

## 2022-01-20 MED ORDER — ONDANSETRON HCL 4 MG/2ML IJ SOLN: 4.0000 mg | Freq: Once | INTRAMUSCULAR | Status: DC | PRN

## 2022-01-20 MED ORDER — ONDANSETRON HCL 4 MG/2ML IJ SOLN
INTRAMUSCULAR | Status: DC | PRN
  Administered 2022-01-20: 4 mg via INTRAVENOUS

## 2022-01-20 MED ORDER — OXYCODONE HCL 5 MG PO TABS
5.0000 mg | ORAL_TABLET | Freq: Once | ORAL | Status: AC | PRN
  Administered 2022-01-20: 5 mg via ORAL

## 2022-01-20 MED ORDER — STERILE WATER FOR IRRIGATION IR SOLN
Status: DC | PRN
  Administered 2022-01-20 (×2): 1000 mL

## 2022-01-20 MED ORDER — LACTATED RINGERS IV SOLN: INTRAVENOUS | Status: DC

## 2022-01-20 MED ORDER — OXYCODONE HCL 5 MG/5ML PO SOLN: 5.0000 mg | Freq: Once | ORAL | Status: AC | PRN

## 2022-01-20 MED ORDER — PROPOFOL 10 MG/ML IV BOLUS
INTRAVENOUS | Status: DC | PRN
  Administered 2022-01-20: 120 mg via INTRAVENOUS

## 2022-01-20 MED ORDER — SUGAMMADEX SODIUM 500 MG/5ML IV SOLN
INTRAVENOUS | Status: DC | PRN
  Administered 2022-01-20: 317.6 mg via INTRAVENOUS

## 2022-01-20 MED ORDER — LIDOCAINE HCL (CARDIAC) PF 100 MG/5ML IV SOSY
PREFILLED_SYRINGE | INTRAVENOUS | Status: DC | PRN
  Administered 2022-01-20: 100 mg via INTRAVENOUS

## 2022-01-20 MED ORDER — FENTANYL CITRATE (PF) 100 MCG/2ML IJ SOLN
INTRAMUSCULAR | Status: DC | PRN
  Administered 2022-01-20 (×2): 25 ug via INTRAVENOUS
  Administered 2022-01-20: 50 ug via INTRAVENOUS

## 2022-01-20 MED ORDER — DEXAMETHASONE SODIUM PHOSPHATE 10 MG/ML IJ SOLN
INTRAMUSCULAR | Status: DC | PRN
  Administered 2022-01-20: 10 mg via INTRAVENOUS

## 2022-01-20 MED ORDER — FENTANYL CITRATE (PF) 100 MCG/2ML IJ SOLN
25.0000 ug | INTRAMUSCULAR | Status: DC | PRN
  Administered 2022-01-20 (×2): 25 ug via INTRAVENOUS
  Administered 2022-01-20: 50 ug via INTRAVENOUS

## 2022-01-20 MED ORDER — FAMOTIDINE 20 MG PO TABS
ORAL_TABLET | ORAL | Status: AC
  Administered 2022-01-20: 20 mg via ORAL
  Filled 2022-01-20: qty 1

## 2022-01-20 MED ORDER — OXYCODONE HCL 5 MG PO TABS
ORAL_TABLET | ORAL | Status: AC
  Filled 2022-01-20: qty 1

## 2022-01-20 MED ORDER — PHENYLEPHRINE HCL-NACL 20-0.9 MG/250ML-% IV SOLN
INTRAVENOUS | Status: DC | PRN
  Administered 2022-01-20: 50 ug/min via INTRAVENOUS

## 2022-01-20 MED ORDER — FENTANYL CITRATE (PF) 100 MCG/2ML IJ SOLN
INTRAMUSCULAR | Status: AC
  Administered 2022-01-20: 50 ug via INTRAVENOUS
  Filled 2022-01-20: qty 2

## 2022-01-20 MED ORDER — CHLORHEXIDINE GLUCONATE 0.12 % MT SOLN
OROMUCOSAL | Status: AC
  Administered 2022-01-20: 15 mL via OROMUCOSAL
  Filled 2022-01-20: qty 15

## 2022-01-20 MED ORDER — SODIUM CHLORIDE 0.9 % IR SOLN
Status: DC | PRN
  Administered 2022-01-20: 60000 mL via INTRAVESICAL

## 2022-01-20 MED ORDER — HYDROCODONE-ACETAMINOPHEN 5-325 MG PO TABS: 1.0000 | ORAL_TABLET | Freq: Four times a day (QID) | ORAL | 0 refills | Status: AC | PRN

## 2022-01-20 MED ORDER — ROCURONIUM BROMIDE 100 MG/10ML IV SOLN
INTRAVENOUS | Status: DC | PRN
  Administered 2022-01-20: 50 mg via INTRAVENOUS
  Administered 2022-01-20 (×2): 20 mg via INTRAVENOUS

## 2022-01-20 SURGICAL SUPPLY — 39 items
ADAPTER IRRIG TUBE 2 SPIKE SOL (ADAPTER) ×2 IMPLANT
ADPR TBG 2 SPK PMP STRL ASCP (ADAPTER) ×3
BAG DRN LRG CPC RND TRDRP CNTR (MISCELLANEOUS) ×1
BAG URO DRAIN 4000ML (MISCELLANEOUS) ×1 IMPLANT
CATH FOL 3WAY LX COUV 24X75 (CATHETERS) IMPLANT
CATH FOLEY 3WAY 30CC 24FR (CATHETERS) ×2
CATH URETL OPEN END 4X70 (CATHETERS) ×1 IMPLANT
CATH URTH STD 24FR FL 3W 2 (CATHETERS) ×1 IMPLANT
CONTAINER COLLECT MORCELLATR (MISCELLANEOUS) ×1 IMPLANT
DRAPE UTILITY 15X26 TOWEL STRL (DRAPES) IMPLANT
ELECT BIVAP BIPO 22/24 DONUT (ELECTROSURGICAL) ×2
ELECTRD BIVAP BIPO 22/24 DONUT (ELECTROSURGICAL) IMPLANT
FIBER LASER MOSES 550 DFL (Laser) ×1 IMPLANT
FILTER OVERFLOW MORCELLATOR (FILTER) ×1 IMPLANT
GLOVE SURG UNDER POLY LF SZ7.5 (GLOVE) ×1 IMPLANT
GOWN STRL REUS W/ TWL LRG LVL3 (GOWN DISPOSABLE) ×1 IMPLANT
GOWN STRL REUS W/ TWL XL LVL3 (GOWN DISPOSABLE) ×1 IMPLANT
GOWN STRL REUS W/TWL LRG LVL3 (GOWN DISPOSABLE) ×1
GOWN STRL REUS W/TWL XL LVL3 (GOWN DISPOSABLE) ×1
HOLDER FOLEY CATH W/STRAP (MISCELLANEOUS) ×1 IMPLANT
IV NS IRRIG 3000ML ARTHROMATIC (IV SOLUTION) ×5 IMPLANT
KIT TURNOVER CYSTO (KITS) ×1 IMPLANT
MANIFOLD NEPTUNE II (INSTRUMENTS) IMPLANT
MBRN O SEALING YLW 17 FOR INST (MISCELLANEOUS) ×1
MEMBRANE SLNG YLW 17 FOR INST (MISCELLANEOUS) ×1 IMPLANT
MORCELLATOR COLLECT CONTAINER (MISCELLANEOUS) ×1
MORCELLATOR OVERFLOW FILTER (FILTER) ×1
MORCELLATOR ROTATION 4.75 335 (MISCELLANEOUS) ×1 IMPLANT
PACK CYSTO AR (MISCELLANEOUS) ×1 IMPLANT
SET CYSTO W/LG BORE CLAMP LF (SET/KITS/TRAYS/PACK) ×1 IMPLANT
SET IRRIG Y TYPE TUR BLADDER L (SET/KITS/TRAYS/PACK) ×1 IMPLANT
SLEEVE PROTECTION STRL DISP (MISCELLANEOUS) ×2 IMPLANT
SURGILUBE 2OZ TUBE FLIPTOP (MISCELLANEOUS) ×1 IMPLANT
SYR 30ML LL (SYRINGE) IMPLANT
SYR TOOMEY IRRIG 70ML (MISCELLANEOUS) ×1
SYRINGE TOOMEY IRRIG 70ML (MISCELLANEOUS) ×1 IMPLANT
TRAP FLUID SMOKE EVACUATOR (MISCELLANEOUS) ×1 IMPLANT
TUBE PUMP MORCELLATOR PIRANHA (TUBING) ×1 IMPLANT
WATER STERILE IRR 1000ML POUR (IV SOLUTION) ×1 IMPLANT

## 2022-01-20 NOTE — Discharge Instructions (Signed)

## 2022-01-20 NOTE — Anesthesia Procedure Notes (Signed)
Procedure Name: Intubation Date/Time: 01/20/2022 9:34 AM  Performed by: Nelda Marseille, CRNAPre-anesthesia Checklist: Patient identified, Patient being monitored, Timeout performed, Emergency Drugs available and Suction available Patient Re-evaluated:Patient Re-evaluated prior to induction Oxygen Delivery Method: Circle System Utilized Preoxygenation: Pre-oxygenation with 100% oxygen Induction Type: IV induction Ventilation: Mask ventilation without difficulty Laryngoscope Size: Mac and 3 Grade View: Grade II Tube type: Oral Tube size: 7.5 mm Number of attempts: 1 Airway Equipment and Method: Stylet and Oral airway Placement Confirmation: ETT inserted through vocal cords under direct vision, positive ETCO2 and breath sounds checked- equal and bilateral Secured at: 21 cm Tube secured with: Tape Dental Injury: Teeth and Oropharynx as per pre-operative assessment  Difficulty Due To: Difficulty was unanticipated

## 2022-01-20 NOTE — Anesthesia Preprocedure Evaluation (Addendum)
Anesthesia Evaluation  Patient identified by MRN, date of birth, ID band Patient awake    Reviewed: Allergy & Precautions, NPO status , Patient's Chart, lab work & pertinent test results  History of Anesthesia Complications Negative for: history of anesthetic complications  Airway Mallampati: IV   Neck ROM: Full    Dental  (+) Upper Dentures, Lower Dentures   Pulmonary COPD, Current Smoker (1/2 ppd) and Patient abstained from smoking.   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 Left ventricle cavity is normal in size.  2. Mild concentric hypertrophy of the left ventricle.  3. Moderate global hypokinesis with frequent ectopy.  4. LVEF around 40%.  5. Indeterminate diastolic filling pattern.  6. Left atrial cavity is mildly dilated.  7. Moderate (Grade II) mitral regurgitation.  8. Moderate tricuspid regurgitation.  9. Mild pulmonic regurgitation.  10. No evidence of pulmonary hypertension.   Myocardial perfusion 02/01/21:  1. Non-diagnostic ECG stress.  2. Atrial bigeminy, occasional PVC at rest and stress.  3. There is a fixed mild defect in the inferior region consistent with diaphragmatic attenuation. Minimal ischemia in this region cannot be excluded.   4. Overall LV systolic function is normal without regional wall motion abnormalities.  5. Calculated Stress LVEF: 49% but visually appears normal.  6. No previous exam available for comparison.    Neuro/Psych  PSYCHIATRIC DISORDERS (mood disorder) Anxiety Depression    Remote hx polysubstance use disorder including cocaine and methamphetamines; current occasional MJ use; chronic pain  Neuromuscular disease (peripheral  neuropathy)    GI/Hepatic hiatal hernia,,,  Endo/Other  negative endocrine ROS    Renal/GU Renal disease (nephrolithiasis)   BPH    Musculoskeletal   Abdominal   Peds  Hematology negative hematology ROS (+)   Anesthesia Other Findings Reviewed and agree with Bayard Males pre-anesthesia clinical review note.    Cardiology note 11/23/21:  1. Frequent atrial and ventricular ectopy Previously stopped multaq and started on amiodarone Asymptomatic.  No symptoms so will not plan on re-monitoring at this time.  Decrease amiodarone to 200 mg M-F, none on Sat-Sun for total dose of 1000 mg weekly   2. HTN Stable on current regimen    3. Coags No bleeding on Eliquis   4. UTI 3 days of antibiotic left.  Per PCP   Follow up with Dr. Lovena Le in 6 months   Reproductive/Obstetrics                             Anesthesia Physical Anesthesia Plan  ASA: 4  Anesthesia Plan: General   Post-op Pain Management:    Induction: Intravenous  PONV Risk Score and Plan: 1 and Ondansetron, Dexamethasone and Treatment may vary due to age or medical condition  Airway Management Planned: Oral ETT  Additional Equipment:   Intra-op Plan:   Post-operative Plan: Extubation in OR  Informed Consent: I have reviewed the patients History and Physical, chart, labs and discussed the procedure including the risks, benefits and alternatives for the proposed anesthesia with the patient or authorized representative who has indicated his/her understanding and acceptance.     Dental advisory given  Plan Discussed with: CRNA  Anesthesia Plan Comments: (Patient consented for risks of anesthesia including  but not limited to:  - adverse reactions to medications - damage to eyes, teeth, lips or other oral mucosa - nerve damage due to positioning  - sore throat or hoarseness - damage to heart, brain, nerves, lungs, other parts of body or loss of life  Informed patient  about role of CRNA in peri- and intra-operative care.  Patient voiced understanding.)        Anesthesia Quick Evaluation

## 2022-01-20 NOTE — Op Note (Signed)
Date of procedure: 01/20/22  Preoperative diagnosis:  BPH with obstruction  Postoperative diagnosis:  Same  Procedure: HoLEP (Holmium Laser Enucleation of the Prostate)  Surgeon: Nickolas Madrid, MD  Anesthesia: General  Complications: None  Intraoperative findings:  Large prostate with obstructing lateral lobes, moderate to severe bladder trabeculations, no stones or bladder lesions Ureteral orifices and verumontanum intact at conclusion of case  EBL: 50 mL  Specimens: Prostate chips  Enucleation time: 18 minutes  Morcellation time: 4 minutes  Intra-op weight: 30g  Drains: 24 French three-way, 60 cc in balloon  Indication: Carlos Short. is a 71 y.o. patient with BPH and incomplete emptying managed with intermittent catheterization 3-4 times daily who opted for HOLEP.  After reviewing the management options for treatment, they elected to proceed with the above surgical procedure(s). We have discussed the potential benefits and risks of the procedure, side effects of the proposed treatment, the likelihood of the patient achieving the goals of the procedure, and any potential problems that might occur during the procedure or recuperation.  We specifically discussed the risks of bleeding, infection, hematuria and clot retention, need for additional procedures, possible overnight hospital stay, temporary urgency and incontinence, rare long-term incontinence, and retrograde ejaculation.  Informed consent has been obtained.   Description of procedure:  The patient was taken to the operating room and general anesthesia was induced.  The patient was placed in the dorsal lithotomy position, prepped and draped in the usual sterile fashion, and preoperative antibiotics(Ancef) were administered.  SCDs were placed for DVT prophylaxis.  A preoperative time-out was performed.   Carlos Short sounds were used to gently dilated the urethra up to 42F. The 63 French continuous flow  resectoscope was inserted into the urethra using the visual obturator  The prostate was large with obstructing lateral lobes. The bladder was thoroughly inspected and notable for moderate to severe bladder trabeculations but no suspicious lesions.  The ureteral orifices were located in orthotopic position.  The laser was set to 2 J and 60 Hz and was used to make a lambda incision just proximal to the verumontanum down to the level of the capsule. Early apical release was performed by making a circumferential mucosal incision proximal to the sphincter.  This incision was followed circumferentially until the entire prostate was enucleated en bloc into the bladder.  The capsule was examined and laser was used for meticulous hemostasis.    The 19 French resectoscope was then switched out for the 48 French nephroscope and the lobes were morcellated and the tissue sent to pathology.  A 24 French three-way catheter was inserted easily with the aid of a catheter guide, and 60 cc were placed in the balloon.  Urine was initially faint pink, but turned to a dark cherry color.  The balloon was deflated and the Foley removed, and I opted to reinsert the resectoscope with the bipolar button.  There appeared to be more bleeding than anticipated, and the plan during HOLEP was excellent.  High suspicion that he did not stop his Eliquis or aspirin, additionally he is on a number of over-the-counter supplements that may contribute to anticoagulation.  There appeared to be some bleeding anteriorly at the bladder neck between 10 and 2:00, and the bipolar was used to meticulously achieve hemostasis.  This was extremely challenging.  After approximately 20 to 30 minutes of careful fulguration at the bladder neck and within the prostatic fossa, there was excellent hemostasis under low-flow conditions.  The catheter guide was  used to reinsert the Foley catheter and urine was faint pink, 60 mL were placed in the balloon.  The catheter  irrigated easily and remained faint pink to clear.  The patient tolerated the procedure well without any immediate complications and was extubated and transferred to the recovery room in stable condition.  Urine was clear on fast CBI.  Disposition: Stable to PACU  Plan: Wean CBI in PACU, anticipate discharge home today with voiding trial in clinic in 2-3 days  Carlos Rams, MD 01/20/2022

## 2022-01-20 NOTE — Transfer of Care (Signed)
Immediate Anesthesia Transfer of Care Note  Patient: Carlos Short.  Procedure(s) Performed: HOLEP-LASER ENUCLEATION OF THE PROSTATE WITH MORCELLATION (Prostate)  Patient Location: PACU  Anesthesia Type:General  Level of Consciousness: awake, oriented, and sedated  Airway & Oxygen Therapy: Patient Spontanous Breathing and Patient connected to face mask oxygen  Post-op Assessment: Report given to RN and Post -op Vital signs reviewed and stable  Post vital signs: Reviewed and stable  Last Vitals:  Vitals Value Taken Time  BP 127/98 01/20/22 1120  Temp 36.1 C 01/20/22 1120  Pulse 84 01/20/22 1126  Resp 22 01/20/22 1126  SpO2 100 % 01/20/22 1126  Vitals shown include unvalidated device data.  Last Pain:  Vitals:   01/20/22 0833  TempSrc: Temporal  PainSc: 0-No pain         Complications:  Encounter Notable Events  Notable Event Outcome Phase Comment  Difficult to intubate - unexpected  Intraprocedure Filed from anesthesia note documentation.

## 2022-01-20 NOTE — Anesthesia Postprocedure Evaluation (Signed)
Anesthesia Post Note  Patient: Kolbe Delmonaco.  Procedure(s) Performed: HOLEP-LASER ENUCLEATION OF THE PROSTATE WITH MORCELLATION (Prostate)  Patient location during evaluation: PACU Anesthesia Type: General Level of consciousness: awake and alert, oriented and patient cooperative Pain management: pain level controlled Vital Signs Assessment: post-procedure vital signs reviewed and stable Respiratory status: spontaneous breathing, nonlabored ventilation and respiratory function stable Cardiovascular status: blood pressure returned to baseline and stable Postop Assessment: adequate PO intake Anesthetic complications: yes   Encounter Notable Events  Notable Event Outcome Phase Comment  Difficult to intubate - unexpected  Intraprocedure Filed from anesthesia note documentation.     Last Vitals:  Vitals:   01/20/22 1225 01/20/22 1230  BP:  103/65  Pulse: 64 (!) 59  Resp: (!) 21 20  Temp:    SpO2: 100% 96%    Last Pain:  Vitals:   01/20/22 1225  TempSrc:   PainSc: 4                  Reed Breech

## 2022-01-20 NOTE — Interval H&P Note (Signed)
UROLOGY H&P UPDATE  Agree with prior H&P dated 01/05/2022.  BPH and retention managed with intermittent catheterization, opted for HOLEP to try to resume spontaneous voiding.  Cardiac: RRR Lungs: CTA bilaterally  Laterality: N/A Procedure: HOLEP  Urine: Culture 10/26 with mixed species, treated with Bactrim  We discussed the risks and benefits of HoLEP at length.  The procedure requires general anesthesia and takes 1 to 2 hours, and a holmium laser is used to enucleate the prostate and push this tissue into the bladder.  A morcellator is then used to remove this tissue, which is sent for pathology.  The vast majority(>95%) of patients are able to discharge the same day with a catheter in place for 2 to 3 days, and will follow-up in clinic for a voiding trial.  We specifically discussed the risks of bleeding, infection, retrograde ejaculation, temporary urgency and urge incontinence, very low risk of long-term incontinence, urethral stricture/bladder neck contracture, pathologic evaluation of prostate tissue and possible detection of prostate cancer or other malignancy, and possible need for additional procedures.  We also discussed <10% risk of persistent retention requiring intermittent catheterization.   Sondra Come, MD 01/20/2022

## 2022-01-21 ENCOUNTER — Encounter: Payer: Self-pay | Admitting: Urology

## 2022-01-23 ENCOUNTER — Ambulatory Visit: Payer: Medicare Other | Admitting: Physician Assistant

## 2022-01-23 DIAGNOSIS — N401 Enlarged prostate with lower urinary tract symptoms: Secondary | ICD-10-CM

## 2022-01-23 DIAGNOSIS — N138 Other obstructive and reflux uropathy: Secondary | ICD-10-CM

## 2022-01-23 LAB — SURGICAL PATHOLOGY

## 2022-01-23 NOTE — Patient Instructions (Signed)

## 2022-01-23 NOTE — Progress Notes (Signed)
Catheter Removal  Patient is present today for a catheter removal.  37ml of water was drained from the balloon. A 24FR three-way foley cath was removed from the bladder, no complications were noted. Patient tolerated well.  Performed by: Carman Ching, PA-C   Additional notes: Counseled patient on normal postoperative findings including dysuria, gross hematuria, and urinary urgency/leakage. Counseled patient to begin Kegel exercises 3x10 sets daily to increase urinary control and wear absorbent products as needed for security. Written and verbal resources provided today. Surgical pathology pending; will defer to Dr. Richardo Hanks to share results when available.   Follow up: 12 week postop f/u with Dr. Richardo Hanks

## 2022-01-24 NOTE — Telephone Encounter (Signed)
Tramadol 50 mg tablet. Medication last filled 05/03/2021. Patient does nt have contract of file.  Medication pended and sent to Kenard Gower, NP

## 2022-01-30 NOTE — Telephone Encounter (Signed)
Patient has a pending appointment on 02/27/22 to sign treatment agreement.   Best practice would be to have patients sign a treatment agreement at the time of establishing care, if it's agreed that provider will take over prescribing a controlled substance.

## 2022-01-30 NOTE — Telephone Encounter (Signed)
Needs to sign narcotic contract.

## 2022-02-17 ENCOUNTER — Ambulatory Visit: Payer: Medicare Other

## 2022-02-17 DIAGNOSIS — I714 Abdominal aortic aneurysm, without rupture, unspecified: Secondary | ICD-10-CM

## 2022-02-27 ENCOUNTER — Ambulatory Visit (INDEPENDENT_AMBULATORY_CARE_PROVIDER_SITE_OTHER): Payer: Medicare Other | Admitting: Adult Health

## 2022-02-27 ENCOUNTER — Encounter: Payer: Self-pay | Admitting: Adult Health

## 2022-02-27 VITALS — BP 110/64 | HR 52 | Temp 97.7°F | Resp 18 | Ht 72.0 in | Wt 181.0 lb

## 2022-02-27 DIAGNOSIS — M545 Low back pain, unspecified: Secondary | ICD-10-CM | POA: Diagnosis not present

## 2022-02-27 DIAGNOSIS — I480 Paroxysmal atrial fibrillation: Secondary | ICD-10-CM | POA: Diagnosis not present

## 2022-02-27 DIAGNOSIS — N400 Enlarged prostate without lower urinary tract symptoms: Secondary | ICD-10-CM | POA: Diagnosis not present

## 2022-02-27 DIAGNOSIS — G8929 Other chronic pain: Secondary | ICD-10-CM

## 2022-02-27 DIAGNOSIS — F01A0 Vascular dementia, mild, without behavioral disturbance, psychotic disturbance, mood disturbance, and anxiety: Secondary | ICD-10-CM

## 2022-02-27 DIAGNOSIS — F5101 Primary insomnia: Secondary | ICD-10-CM | POA: Diagnosis not present

## 2022-02-27 DIAGNOSIS — F339 Major depressive disorder, recurrent, unspecified: Secondary | ICD-10-CM

## 2022-02-27 MED ORDER — TRAZODONE HCL 100 MG PO TABS: 50.0000 mg | ORAL_TABLET | Freq: Every day | ORAL | 0 refills | Status: DC

## 2022-02-27 MED ORDER — TRAMADOL HCL 50 MG PO TABS: 50.0000 mg | ORAL_TABLET | Freq: Two times a day (BID) | ORAL | 0 refills | Status: DC | PRN

## 2022-02-27 MED ORDER — APIXABAN 5 MG PO TABS: 5.0000 mg | ORAL_TABLET | Freq: Two times a day (BID) | ORAL | 3 refills | Status: DC

## 2022-02-27 MED ORDER — SERTRALINE HCL 25 MG PO TABS: 25.0000 mg | ORAL_TABLET | Freq: Every day | ORAL | 3 refills | Status: DC

## 2022-02-27 NOTE — Progress Notes (Signed)
[facility] clinic  Provider: Durenda Age DNP  Code Status:  Full Code  Goals of Care:     02/27/2022    9:04 AM  Advanced Directives  Does Patient Have a Medical Advance Directive? No  Would patient like information on creating a medical advance directive? No - Patient declined     Chief Complaint  Patient presents with   Medical Management of Chronic Issues    Patient is here for a follow up for chronic conditions    Quality Metric Gaps    Patient is due for AWV, and colonoscopy   Immunizations    Patient is due for updated covid booster, flu vaccine, pneumonia, and discuss need for shingrix vaccine    HPI: Patient is a 71 y.o. male seen today for medical management of chronic issues.  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   COPD (chronic obstructive pulmonary disease) (HCC)    Depression    Diverticulosis    Frequent PVCs    Hiatal hernia    History of kidney stones    Hyperlipidemia    Hypertension    Hypertrophy of prostate with urinary obstruction and other lower urinary tract symptoms (LUTS)    Infrarenal abdominal aortic aneurysm  (AAA) without rupture ([place]) 09/24/2018   a.) CTA CAP 09/24/2018: 4.2 x 4.2 cm; b.) CT abd abd 05/29/2019: 4.0 cm; c.) aorta duplex 12/30/2020: 4.14 x 4.08 x 4.19 cm; d.) CT abd/pel 02/21/2021: 4.6 cm; e.) CT renal 04/01/2021: 4.3 cm; f.) CT renal 04/19/2021: 4.6 cm   Insomnia, unspecified    a.) on trazodone PRN   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 ([place]) 01/29/2021   in hospital with sepsis   Long term current use of amiodarone    Long term current use of anticoagulant    a.) apixaban   Memory difficulties    a.) on memantine   Mood disorder (HCC)    Bipolar versus schizophrenia   Neuropathy of right foot    a.) s/p hip surgery   Osteoporosis    a.) on oral bisphosphonate   PAF (paroxysmal atrial fibrillation) ([place])    a.) holter 07/25/2020: NSR, PAF/flutter (14% burden) with longest lasting 45 minutes, freq PACs (24.6% burden); b.) holter 06/01/2021: NSR, episodes of NSVT, SVT (longest lasting 20 beats), freq PACs (29.3% burden), PVCs (2.6% burden), vent bi/trigeminy   RBBB (right bundle branch block)    Restless leg syndrome    a.) on ropinirole   Wears glasses    Wears partial dentures    top and bottom partial    Past Surgical History:  Procedure Laterality Date   CYSTOSCOPY WITH LITHOLAPAXY N/A 05/03/2021   Procedure: CYSTOSCOPY WITH LITHOLAPAXY holmium laser;  Surgeon: Despina Arias, MD;  Location: [facility];  Service: Urology;  Laterality: N/A;   HIP ARTHROPLASTY Right 2005   HOLEP-LASER ENUCLEATION OF THE PROSTATE WITH MORCELLATION N/A 01/20/2022   Procedure: HOLEP-LASER ENUCLEATION OF THE PROSTATE WITH MORCELLATION;  Surgeon: Sondra Come, MD;  Location: [facility];  Service: Urology;  Laterality: N/A;   INGUINAL HERNIA REPAIR Bilateral 08/16/2012   Procedure: LAPAROSCOPIC BILATERAL INGUINAL HERNIA REPAIR;  Surgeon: Wilmon Arms. Corliss Skains, MD;  Location: [facility];  Service: General;  Laterality: Bilateral;   INSERTION OF MESH Bilateral 08/16/2012   Procedure:  INSERTION OF MESH;  Surgeon: Wilmon Arms. Corliss Skains, MD;  Location: [facility];  Service: General;  Laterality: Bilateral;   MULTIPLE TOOTH EXTRACTIONS     TONSILLECTOMY     age 68   UPPER GI ENDOSCOPY      Allergies  Allergen Reactions   Gabapentin Other (See Comments)    crazy   Tylenol [Acetaminophen] Other (See Comments)    Hurts his kidneys    Outpatient Encounter Medications as of 02/27/2022  Medication Sig   amiodarone (PACERONE) 200 MG tablet Take 1 tablet (200 mg total) by mouth daily. Monday - Friday only   doxazosin (CARDURA) 8 MG tablet Take 1 tablet (8 mg total) by mouth daily.   ibandronate (BONIVA) 150 MG tablet Take 150 mg by mouth every 30 (thirty) days. 3rd of each month   memantine (NAMENDA) 5 MG tablet TAKE 1 TABLET (5 MG AT NIGHT) FOR 2 WEEKS, THEN INCREASE TO 1 TABLET (5 MG) TWICE A DAY   metoprolol tartrate (LOPRESSOR) 25 MG tablet Take 0.5 tablets (12.5 mg total) by mouth 2 (two) times daily. Hold for SBP <105 and HR <60   OVER THE COUNTER MEDICATION 2 (two) times daily. Mens over 50 vitamin pack   rOPINIRole (REQUIP) 1 MG tablet Take 1 mg by mouth 2 (two) times daily.   sertraline (ZOLOFT) 25 MG tablet Take 1 tablet (25 mg total) by mouth daily.   sulfamethoxazole-trimethoprim (BACTRIM DS) 800-160 MG tablet Take 1 tablet by mouth every 12 (twelve) hours.   [DISCONTINUED] ELIQUIS 5 MG TABS tablet TAKE 1 TABLET BY MOUTH TWICE A DAY   [DISCONTINUED] traMADol (ULTRAM) 50 MG tablet Take 1 tablet (50 mg total) by mouth every 6 (six) hours as needed (pain (max 3 tablets/24 hours)).   [DISCONTINUED] traZODone (DESYREL) 100 MG tablet Take 50 mg by mouth at bedtime.   apixaban (ELIQUIS) 5 MG TABS tablet Take 1 tablet (5 mg total) by mouth 2 (two) times daily.   traMADol (ULTRAM) 50 MG tablet Take 1 tablet (50 mg total) by mouth every 12 (twelve) hours as needed (pain).   traZODone (DESYREL) 100 MG tablet Take 0.5 tablets (50 mg total) by mouth at bedtime.   No  facility-administered encounter medications on file as of 02/27/2022.    Review of Systems:  Review of Systems  Constitutional:  Negative for activity change, appetite change and fever.  HENT:  Negative for sore throat.   Eyes: Negative.   Cardiovascular:  Negative for chest pain and leg swelling.  Gastrointestinal:  Negative for abdominal distention, diarrhea and vomiting.  Genitourinary:  Negative for dysuria, frequency and urgency.  Skin:  Negative for color change.  Neurological:  Negative for dizziness and headaches.  Psychiatric/Behavioral:  Negative for behavioral problems, sleep disturbance and suicidal ideas. The patient is not nervous/anxious.     Health Maintenance  Topic Date Due   Pneumonia Vaccine 65+  Years old (1 - PCV) Never done   COLONOSCOPY (Pts 45-25yrs Insurance coverage will need to be confirmed)  Never done   Zoster Vaccines- Shingrix (1 of 2) Never done   INFLUENZA VACCINE  Never done   Medicare Annual Wellness (AWV)  10/20/2021   COVID-19 Vaccine (3 - 2023-24 season) 11/11/2021   DTaP/Tdap/Td (2 - Td or Tdap) 04/10/2024   Hepatitis C Screening  Completed   HPV VACCINES  Aged Out    Physical Exam: Vitals:   02/27/22 0957  BP: 110/64  Pulse: (!) 52  Resp: 18  Temp: 97.7 F (36.5 C)  SpO2: 96%  Weight: 181 lb (82.1 kg)  Height: 6' (1.829 m)   Body mass index is 24.55 kg/m. Physical Exam Constitutional:      Appearance: Normal appearance.  HENT:     Head: Normocephalic and atraumatic.     Mouth/Throat:     Mouth: Mucous membranes are moist.  Eyes:     Conjunctiva/sclera: Conjunctivae normal.  Cardiovascular:     Rate and Rhythm: Normal rate and regular rhythm.     Pulses: Normal pulses.     Heart sounds: Normal heart sounds.  Pulmonary:     Effort: Pulmonary effort is normal.     Breath sounds: Normal breath sounds.  Abdominal:     General: Bowel sounds are normal.     Palpations: Abdomen is soft.  Musculoskeletal:        General: No  swelling. Normal range of motion.     Cervical back: Normal range of motion.  Skin:    General: Skin is warm and dry.  Neurological:     General: No focal deficit present.     Mental Status: He is alert and oriented to person, place, and time.  Psychiatric:        Mood and Affect: Mood normal.        Behavior: Behavior normal.        Thought Content: Thought content normal.        Judgment: Judgment normal.     Labs reviewed: Basic Metabolic Panel: Recent Labs    04/20/21 0510 08/18/21 1216 09/22/21 1517 11/13/21 1710 11/29/21 0835  NA 137 140  --  136  --   K 4.1 3.5  --  3.4*  --   CL 107 111  --  106  --   CO2 23 23  --  20*  --   GLUCOSE 97 90  --  141*  --   BUN 19 17  --  21  --   CREATININE 0.95 0.99  --  1.11  --   CALCIUM 8.8* 8.6*  --  8.6*  --   TSH  --   --  0.14*  --  0.95   Liver Function Tests: Recent Labs    04/19/21 0045 04/20/21 0510 08/18/21 1216  AST 16 13* 13*  ALT 18 16 12   ALKPHOS 58 50 44  BILITOT 0.5 0.8 0.5  PROT 7.4 7.1 6.2*  ALBUMIN 3.8 3.7 3.1*   No results for input(s): "LIPASE", "AMYLASE" in the last 8760 hours. No results for input(s): "AMMONIA" in the last 8760 hours. CBC: Recent Labs    04/19/21 0045 04/20/21 0510 08/18/21 1216 11/13/21 1710  WBC 14.2* 10.4 6.7 20.7*  NEUTROABS 10.1*  --  4.2 17.6*  HGB 14.3 14.1 12.2* 13.7  HCT 42.2 42.7 36.8* 40.4  MCV 94.8 96.0 92.7 88.8  PLT 194 175 150 178   Lipid  Panel: Recent Labs    11/29/21 0835  CHOL 179  HDL 63  LDLCALC 101*  TRIG 64  CHOLHDL 2.8   Lab Results  Component Value Date   HGBA1C 5.5 11/29/2021    Procedures since last visit: PCV AORTA DUPLEX  Addendum Date: 02/18/2022   Abdominal Aortic Duplex 02/17/2022: Severe dilatation of the abdominal aorta is noted in the mid aorta. Moderate dilatation of the abdominal aorta is noted in the proximal and distal aorta. Largest aneurysmal dimension in the proximal aorta is 5.2 x 5.2 x 5.5 cm. Mild laminated  thrombus noted. There is mild turbulence in the distal abdominal aorta more suggestive of turbulent flow than true stenosis. Bilateral CIA appear patent and normal flow. Compared to the study done on 01/07/2021, there is progression of AAA from 4 cm approximately.  Consider surgical referral.  Result Date: 02/18/2022 Abdominal Aortic Duplex 02/16/2022: Severe dilatation of the abdominal aorta is noted in the mid aorta. Moderate dilatation of the abdominal aorta is noted in the proximal and distal aorta. Largest aneurysmal dimension in the proximal aorta is 5.2 x 5.2 x 5.5 cm. Mild laminated thrombus noted. There is mild turbulence in the distal abdominal aorta more suggestive of turbulent flow than true stenosis. Bilateral CIA appear patent and normal flow. Compared to the study done on 01/07/2021, there is progression of AAA from 4 cm approximately.  Consider surgical referral.    Assessment/Plan  1. Benign prostatic hyperplasia without lower urinary tract symptoms -  S/P HoLEP 01/20/22 -  no hematuria -  continue Cardura  2. PAF (paroxysmal atrial fibrillation) (HCC) -  rate-controlled, continue Eliquis for anticoagulation and Amiodarone and Metoprolol for rate control - apixaban (ELIQUIS) 5 MG TABS tablet; Take 1 tablet (5 mg total) by mouth 2 (two) times daily.  Dispense: 60 tablet; Refill: 3  3. Primary insomnia - traZODone (DESYREL) 100 MG tablet; Take 0.5 tablets (50 mg total) by mouth at bedtime.  Dispense: 30 tablet; Refill: 0  4. Chronic midline low back pain without sciatica - traMADol (ULTRAM) 50 MG tablet; Take 1 tablet (50 mg total) by mouth every 12 (twelve) hours as needed (pain).  Dispense: 30 tablet; Refill: 0  5. 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 Major depression, recurrent, chronic (HCC) -  feels depressed, cries weekly -  will start on  Sertraline - sertraline (ZOLOFT) 25 MG tablet; Take 1 tablet (25 mg total) by mouth daily.  Dispense: 30 tablet; Refill: 3 - Ambulatory referral to Psychiatry   Labs/tests ordered:  None  Next appt:  in a month

## 2022-02-27 NOTE — Patient Instructions (Signed)
Major Depressive Disorder, Adult Major depressive disorder (MDD) is a mental health condition. People with this disorder feel very sad, hopeless, and lose interest in things. Symptoms last most of the day, almost every day, for 2 weeks. MDD can affect: Relationships. Work and school. Things you usually like to do. What are the causes? The cause of MDD is not known. What increases the risk? Having family members with depression. Being male. Family problems. Alcohol or drug misuse. A lot of stress in your life, such as from: Living without basic needs such as food and housing. Being treated poorly because of race, sex, or religion (discrimination). Things that caused you pain as a child, especially if you lost a parent or were abused. Health and mental problems that you have had for a long time. What are the signs or symptoms? The main symptoms of this condition are: Being sad all the time. Being grouchy (irritable) all the time. Not enjoying the things you usually like. Sleeping too much or too little. Eating too much or too little. Feeling tired. Other symptoms include: Gaining or losing weight, without knowing why. Being restless and weak. Feeling hopeless, worthless, or guilty. Trouble thinking or making decisions. Thoughts of hurting yourself or others, or thoughts of ending your life. Spending a lot of time alone. Being unable to do daily tasks. If you have very bad MDD, you may: Believe things that are not true. Hear, see, taste, or feel things that are not there. Have mild depression that lasts for at least 2 years. Feel very sad and hopeless. Have trouble speaking or moving. Feel very sad during some seasons. How is this treated? Talk therapy. This teaches you about thoughts, feelings, and actions and how to change them. This can also help you to talk with others. This can be done with members of your family. Medicines. Lifestyle changes. You may need to: Limit  alcohol use. Stop using drugs, if you use them. Exercise. Get plenty of sleep. Eat healthy. Spend more time outdoors. Brain stimulation. This may be done when symptoms are very bad or have not gotten better. Follow these instructions at home: Alcohol use Do not drink alcohol if: Your health care provider tells you not to drink. You are pregnant, may be pregnant, or are planning to become pregnant. If you drink alcohol: Limit how much you use to: 0-1 drink a day for women. 0-2 drinks a day for men. Know how much alcohol is in your drink. In the [place] one drink equals one 12 oz bottle of beer (355 mL), one 5 oz glass of wine (148 mL), or one 1 oz glass of hard liquor (44 mL). Activity Exercise as told by your doctor. Spend time outdoors. Make time to do the things you enjoy. Find ways to deal with stress. Try to: Meditate. Do deep breathing. Spend time in nature. Keep a journal. Return to your normal activities when your doctor says that it is safe. General instructions  Take over-the-counter and prescription medicines only as told by your doctor. Talk to your doctor about: Alcohol use. It can affect medicines. Any drug use. Eat healthy foods. Get a lot of sleep. Think about joining a support group. Ask your doctor about that. Keep all follow-up visits. Your doctor will need to check on your mood, behavior, and medicines, and change your treatment as needed. Where to find more information: National Alliance on Mental Illness: nami.org National Institute of Mental Health: nimh.nih.gov American Psychiatric Association: psychiatry.org Contact a doctor   if: You feel worse. You get new symptoms. Get help right away if: You hurt yourself on purpose (self-harm). You have thoughts about hurting yourself or others. You see, hear, taste, smell, or feel things that are not there. Get help right away if you feel like you may hurt yourself or others, or have thoughts about taking  your own life. Go to your nearest emergency room or: Call 911. Call the National Suicide Prevention Lifeline at 1-800-273-8255 or 988. This is open 24 hours a day. Text the Crisis Text Line at 741741. This information is not intended to replace advice given to you by your health care provider. Make sure you discuss any questions you have with your health care provider. Document Revised: 07/05/2021 Document Reviewed: 07/05/2021 Elsevier Patient Education  2023 [employer]  

## 2022-03-24 ENCOUNTER — Telehealth (INDEPENDENT_AMBULATORY_CARE_PROVIDER_SITE_OTHER): Payer: Medicare Other | Admitting: Adult Health

## 2022-03-24 DIAGNOSIS — R112 Nausea with vomiting, unspecified: Secondary | ICD-10-CM

## 2022-03-24 DIAGNOSIS — J069 Acute upper respiratory infection, unspecified: Secondary | ICD-10-CM | POA: Diagnosis not present

## 2022-03-24 DIAGNOSIS — I480 Paroxysmal atrial fibrillation: Secondary | ICD-10-CM

## 2022-03-24 DIAGNOSIS — R296 Repeated falls: Secondary | ICD-10-CM

## 2022-03-24 MED ORDER — GUAIFENESIN ER 600 MG PO TB12: 600.0000 mg | ORAL_TABLET | Freq: Two times a day (BID) | ORAL | 0 refills | Status: DC

## 2022-03-24 MED ORDER — DOXYCYCLINE HYCLATE 100 MG PO TABS: 100.0000 mg | ORAL_TABLET | Freq: Two times a day (BID) | ORAL | 0 refills | Status: AC

## 2022-03-24 MED ORDER — ONDANSETRON HCL 4 MG PO TABS: 4.0000 mg | ORAL_TABLET | Freq: Three times a day (TID) | ORAL | 0 refills | Status: DC | PRN

## 2022-03-24 NOTE — Progress Notes (Addendum)
DATE:  03/24/2022 MRN:  [id number]   BIRTHDAY: [birth year]   Contact Information     Name Relation Home Work Mobile   Oshel,Pamela Spouse [phone]  [phone]        Code Status History     Date Active Date Inactive Code Status Order ID Comments User Context   04/19/2021 [id number] 04/21/2021 1839 Full Code [id number]  Jonnie Finner, DO ED   04/01/2021 0409 04/03/2021 2112 Full Code [id number]  Rise Patience, MD ED   12/31/2020 1523 01/03/2021 1812 Full Code [id number]  Reubin Milan, MD ED   06/13/2017 1555 06/14/2017 1441 Full Code [id number]  Janece Canterbury, MD Inpatient      Provider is at [facility] and patient is at home.   Chief Complaint  Patient presents with   Acute Visit    HISTORY OF PRESENT ILLNESS: This is a 72 year old male who had a video visit for complaints of coughing. Wife was also in the video call supplementing the medical history. He had 6 teeth extracted 2.5 weeks ago and completed Amoxicillin X 6 days. He has a productive cough for 8 days. He denies sore throat, fever and chills. Wife tried getting his temperature last night but he refused. Wife stated that he has been staying in bed a lot.  He fell 3X last night but denies hitting his head. He had been throwing up occasionally for 3 weeks. He denies body aches. He takes Zoloft for depression and Trazodone for insomnia. Wife is currently sick and is currently taking antibiotics and steroid for respiratory infection.   PAST MEDICAL HISTORY:  Past Medical History:  Diagnosis Date   Anxiety    Aortic atherosclerosis (HCC)    CHF (congestive heart failure) ([place]) 02/01/2021   a.) TTE 02/01/2021: EF 40%, mild LVH, mild LAE, midl PR, mod TR, G2DD.   COPD (chronic obstructive pulmonary disease) (HCC)    Depression    Diverticulosis    Frequent PVCs    Hiatal hernia    History of kidney stones    Hyperlipidemia    Hypertension    Hypertrophy of prostate with urinary  obstruction and other lower urinary tract symptoms (LUTS)    Infrarenal abdominal aortic aneurysm (AAA) without rupture ([place]) 09/24/2018   a.) CTA CAP 09/24/2018: 4.2 x 4.2 cm; b.) CT abd abd 05/29/2019: 4.0 cm; c.) aorta duplex 12/30/2020: 4.14 x 4.08 x 4.19 cm; d.) CT abd/pel 02/21/2021: 4.6 cm; e.) CT renal 04/01/2021: 4.3 cm; f.) CT renal 04/19/2021: 4.6 cm   Insomnia, unspecified    a.) on trazodone PRN   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 ([place]) 01/29/2021   in hospital with sepsis   Long term current use of amiodarone    Long term current use of anticoagulant    a.) apixaban   Memory difficulties    a.) on memantine   Mood disorder (HCC)    Bipolar versus schizophrenia   Neuropathy of right foot    a.) s/p hip surgery   Osteoporosis    a.) on oral bisphosphonate   PAF (paroxysmal atrial fibrillation) ([place])    a.) holter 07/25/2020: NSR, PAF/flutter (14% burden) with longest lasting 45 minutes, freq PACs (24.6% burden); b.) holter 06/01/2021: NSR, episodes of NSVT, SVT (longest lasting 20 beats), freq PACs (29.3% burden), PVCs (2.6% burden), vent bi/trigeminy   RBBB (right bundle branch block)    Restless leg syndrome    a.) on ropinirole   Wears glasses    Wears  partial dentures    top and bottom partial     CURRENT MEDICATIONS: Reviewed  Patient's Medications  New Prescriptions   No medications on file  Previous Medications   AMIODARONE (PACERONE) 200 MG TABLET    Take 1 tablet (200 mg total) by mouth daily. Monday - Friday only   APIXABAN (ELIQUIS) 5 MG TABS TABLET    Take 1 tablet (5 mg total) by mouth 2 (two) times daily.   DOXAZOSIN (CARDURA) 8 MG TABLET    Take 1 tablet (8 mg total) by mouth daily.   IBANDRONATE (BONIVA) 150 MG TABLET    Take 150 mg by mouth every 30 (thirty) days. 3rd of each month   MEMANTINE (NAMENDA) 5 MG TABLET    TAKE 1 TABLET (5 MG AT NIGHT) FOR 2 WEEKS, THEN INCREASE TO 1 TABLET (5 MG) TWICE A DAY   METOPROLOL TARTRATE (LOPRESSOR) 25 MG TABLET     Take 0.5 tablets (12.5 mg total) by mouth 2 (two) times daily. Hold for SBP <105 and HR <60   OVER THE COUNTER MEDICATION    2 (two) times daily. Mens over 50 vitamin pack   ROPINIROLE (REQUIP) 1 MG TABLET    Take 1 mg by mouth 2 (two) times daily.   SERTRALINE (ZOLOFT) 25 MG TABLET    Take 1 tablet (25 mg total) by mouth daily.   TRAMADOL (ULTRAM) 50 MG TABLET    Take 1 tablet (50 mg total) by mouth every 12 (twelve) hours as needed (pain).   TRAZODONE (DESYREL) 100 MG TABLET    Take 0.5 tablets (50 mg total) by mouth at bedtime.  Modified Medications   No medications on file  Discontinued Medications   SULFAMETHOXAZOLE-TRIMETHOPRIM (BACTRIM DS) 800-160 MG TABLET    Take 1 tablet by mouth every 12 (twelve) hours.     Allergies  Allergen Reactions   Gabapentin Other (See Comments)    crazy   Tylenol [Acetaminophen] Other (See Comments)    Hurts his kidneys     REVIEW OF SYSTEMS:  GENERAL: no change in appetite, doesn't want to get out of bed, no fever, chills SKIN: Denies rash, itching, wounds, ulcer sores, or nail abnormality EYES: Denies change in vision, dry eyes, eye pain, itching or discharge EARS: Denies change in hearing, ringing in ears, or earache NOSE: Denies nasal congestion or epistaxis MOUTH and THROAT: Denies oral discomfort, gingival pain or bleeding, pain from teeth or hoarseness   RESPIRATORY: has productive cough CARDIAC: no chest pain, edema or palpitations GI: has nausea and vomiting GU: Denies dysuria, frequency, hematuria, incontinence, or discharge MUSCULOSKELETAL: Denies joit pain, muscle pain, back pain, restricted movement, or unusual weakness NEUROLOGICAL: Denies dizziness, syncope, numbness, or headache PSYCHIATRIC: Denies feeling of depression or anxiety. No report of hallucinations, insomnia, paranoia, or agitation    LABS/RADIOLOGY: Labs reviewed: Basic Metabolic Panel: Recent Labs    04/20/21 0510 08/18/21 1216 11/13/21 1710  NA 137 140  136  K 4.1 3.5 3.4*  CL 107 111 106  CO2 23 23 20*  GLUCOSE 97 90 141*  BUN 19 17 21   CREATININE 0.95 0.99 1.11  CALCIUM 8.8* 8.6* 8.6*   Liver Function Tests: Recent Labs    04/19/21 0045 04/20/21 0510 08/18/21 1216  AST 16 13* 13*  ALT 18 16 12   ALKPHOS 58 50 44  BILITOT 0.5 0.8 0.5  PROT 7.4 7.1 6.2*  ALBUMIN 3.8 3.7 3.1*   No results for input(s): "LIPASE", "AMYLASE" in the last 8760  hours. No results for input(s): "AMMONIA" in the last 8760 hours. CBC: Recent Labs    04/19/21 0045 04/20/21 0510 08/18/21 1216 11/13/21 1710  WBC 14.2* 10.4 6.7 20.7*  NEUTROABS 10.1*  --  4.2 17.6*  HGB 14.3 14.1 12.2* 13.7  HCT 42.2 42.7 36.8* 40.4  MCV 94.8 96.0 92.7 88.8  PLT 194 175 150 178   A1C: Invalid input(s): "A1C" Lipid Panel: Recent Labs    11/29/21 0835  HDL 63   Cardiac Enzymes: No results for input(s): "CKTOTAL", "CKMB", "CKMBINDEX", "TROPONINI" in the last 8760 hours. BNP: Invalid input(s): "POCBNP" CBG: Recent Labs    04/03/21 0548  GLUCAP 100*      No results found.  ASSESSMENT/PLAN:  1. Upper respiratory tract infection, unspecified type - doxycycline (VIBRA-TABS) 100 MG tablet; Take 1 tablet (100 mg total) by mouth 2 (two) times daily for 7 days.  Dispense: 14 tablet; Refill: 0 - guaiFENesin (MUCINEX) 600 MG 12 hr tablet; Take 1 tablet (600 mg total) by mouth 2 (two) times daily for 14 days.  Dispense: 28 tablet; Refill: 0 -  encouraged to drink fluids -  If symptoms get worse, instructed patient to go to urgent care or ED  2. Multiple falls -  denies hitting his head - discontinue sertraline to prevent serotonin toxicity -  hold Trazodone for sedation -  instructed to use cane or assistance when getting out of bed -  fall precautions  3. PAF (paroxysmal atrial fibrillation) (HCC) - continue Eliquis for anticoagulation  4. Nausea and vomiting in adult - ondansetron (ZOFRAN) 4 MG tablet; Take 1 tablet (4 mg total) by mouth every 8  (eight) hours as needed for nausea or vomiting.  Dispense: 30 tablet; Refill: 0      Time spent on non face to face visit:  22 minutes  The patient gave consent to this video visit. Explained to the patient the risk and privacy issue that was involved with this video call.   The patient was advised to call back and ask for an in-person evaluation if the symptoms worsen or if the condition fails to improve.   Durenda Age, NP [employer] [phone]

## 2022-03-25 ENCOUNTER — Emergency Department (HOSPITAL_COMMUNITY)
Admission: EM | Admit: 2022-03-25 | Discharge: 2022-03-25 | Discharge status: Against Medical Advice | Payer: Medicare Other | Attending: Emergency Medicine | Admitting: Emergency Medicine

## 2022-03-25 ENCOUNTER — Emergency Department (HOSPITAL_COMMUNITY): Payer: Medicare Other

## 2022-03-25 ENCOUNTER — Other Ambulatory Visit: Payer: Self-pay

## 2022-03-25 DIAGNOSIS — I110 Hypertensive heart disease with heart failure: Secondary | ICD-10-CM | POA: Diagnosis not present

## 2022-03-25 DIAGNOSIS — I509 Heart failure, unspecified: Secondary | ICD-10-CM | POA: Insufficient documentation

## 2022-03-25 DIAGNOSIS — R0781 Pleurodynia: Secondary | ICD-10-CM | POA: Diagnosis present

## 2022-03-25 DIAGNOSIS — Z5329 Procedure and treatment not carried out because of patient's decision for other reasons: Secondary | ICD-10-CM

## 2022-03-25 DIAGNOSIS — Z7901 Long term (current) use of anticoagulants: Secondary | ICD-10-CM | POA: Diagnosis not present

## 2022-03-25 DIAGNOSIS — Z79899 Other long term (current) drug therapy: Secondary | ICD-10-CM | POA: Insufficient documentation

## 2022-03-25 DIAGNOSIS — W19XXXA Unspecified fall, initial encounter: Secondary | ICD-10-CM

## 2022-03-25 DIAGNOSIS — R0789 Other chest pain: Secondary | ICD-10-CM

## 2022-03-25 DIAGNOSIS — F0390 Unspecified dementia without behavioral disturbance: Secondary | ICD-10-CM | POA: Diagnosis not present

## 2022-03-25 LAB — CBC WITH DIFFERENTIAL/PLATELET
Abs Immature Granulocytes: 0.04 10*3/uL (ref 0.00–0.07)
Basophils Absolute: 0 10*3/uL (ref 0.0–0.1)
Basophils Relative: 0 %
Eosinophils Absolute: 0.1 10*3/uL (ref 0.0–0.5)
Eosinophils Relative: 1 %
HCT: 40.8 % (ref 39.0–52.0)
Hemoglobin: 13.3 g/dL (ref 13.0–17.0)
Immature Granulocytes: 0 %
Lymphocytes Relative: 20 %
Lymphs Abs: 2.1 10*3/uL (ref 0.7–4.0)
MCH: 28.1 pg (ref 26.0–34.0)
MCHC: 32.6 g/dL (ref 30.0–36.0)
MCV: 86.1 fL (ref 80.0–100.0)
Monocytes Absolute: 0.7 10*3/uL (ref 0.1–1.0)
Monocytes Relative: 7 %
Neutro Abs: 7.6 10*3/uL (ref 1.7–7.7)
Neutrophils Relative %: 72 %
Platelets: 128 10*3/uL — ABNORMAL LOW (ref 150–400)
RBC: 4.74 MIL/uL (ref 4.22–5.81)
RDW: 13.8 % (ref 11.5–15.5)
WBC: 10.5 10*3/uL (ref 4.0–10.5)
nRBC: 0 % (ref 0.0–0.2)

## 2022-03-25 LAB — BASIC METABOLIC PANEL
Anion gap: 11 (ref 5–15)
BUN: 17 mg/dL (ref 8–23)
CO2: 20 mmol/L — ABNORMAL LOW (ref 22–32)
Calcium: 8.6 mg/dL — ABNORMAL LOW (ref 8.9–10.3)
Chloride: 104 mmol/L (ref 98–111)
Creatinine, Ser: 1.12 mg/dL (ref 0.61–1.24)
GFR, Estimated: >60 mL/min (ref >60)
Glucose, Bld: 99 mg/dL (ref 70–99)
Potassium: 3.8 mmol/L (ref 3.5–5.1)
Sodium: 135 mmol/L (ref 135–145)

## 2022-03-25 MED ORDER — FENTANYL CITRATE PF 50 MCG/ML IJ SOSY
50.0000 ug | PREFILLED_SYRINGE | Freq: Once | INTRAMUSCULAR | Status: AC
  Administered 2022-03-25: 50 ug via INTRAMUSCULAR
  Filled 2022-03-25: qty 1

## 2022-03-25 MED ORDER — HYDROMORPHONE HCL 1 MG/ML IJ SOLN: 1.0000 mg | Freq: Once | INTRAMUSCULAR | Status: DC

## 2022-03-25 MED ORDER — LIDOCAINE 5 % EX PTCH
1.0000 | MEDICATED_PATCH | Freq: Once | CUTANEOUS | Status: DC
  Administered 2022-03-25: 1 via TRANSDERMAL
  Filled 2022-03-25: qty 1

## 2022-03-25 MED ORDER — ONDANSETRON 8 MG PO TBDP
8.0000 mg | ORAL_TABLET | Freq: Once | ORAL | Status: AC
  Administered 2022-03-25: 8 mg via ORAL
  Filled 2022-03-25: qty 1
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
## 2022-03-25 NOTE — Discharge Instructions (Addendum)
You were seen in the emergency department after your fall.  Your x-ray showed no signs of rib fractures.  You are on a blood thinner so we recommended that you have a head CT to make sure that you do not have an injury or bleeding in your brain, however you chose to leave [place] prior to your completion of workup.  Due to being on blood thinners, you are at risk for head bleed that could lead to permanent brain injury, neurologic disability or even death.  You should have close follow-up with your primary doctor to have your symptoms rechecked.  You can return to the emergency department at any time should you want completion of your workup or if you have any new or worsening symptoms.

## 2022-03-25 NOTE — ED Triage Notes (Signed)
Pt reports mechanical fall a few days ago. C/o left chest pain and sob

## 2022-03-25 NOTE — Progress Notes (Signed)
This patient needs an IV for CT scan please.

## 2022-03-27 ENCOUNTER — Telehealth: Payer: Medicare Other | Admitting: Adult Health

## 2022-03-30 ENCOUNTER — Ambulatory Visit: Payer: Medicare Other | Admitting: Adult Health

## 2022-04-03 ENCOUNTER — Encounter: Payer: Self-pay | Admitting: Adult Health

## 2022-04-03 ENCOUNTER — Ambulatory Visit (INDEPENDENT_AMBULATORY_CARE_PROVIDER_SITE_OTHER): Payer: Medicare Other | Admitting: Adult Health

## 2022-04-03 VITALS — BP 100/80 | HR 72 | Temp 97.7°F | Resp 18 | Ht 70.0 in | Wt 170.0 lb

## 2022-04-03 DIAGNOSIS — F5101 Primary insomnia: Secondary | ICD-10-CM

## 2022-04-03 DIAGNOSIS — I480 Paroxysmal atrial fibrillation: Secondary | ICD-10-CM

## 2022-04-03 DIAGNOSIS — M545 Low back pain, unspecified: Secondary | ICD-10-CM

## 2022-04-03 DIAGNOSIS — G2581 Restless legs syndrome: Secondary | ICD-10-CM | POA: Diagnosis not present

## 2022-04-03 DIAGNOSIS — G8929 Other chronic pain: Secondary | ICD-10-CM

## 2022-04-03 DIAGNOSIS — N400 Enlarged prostate without lower urinary tract symptoms: Secondary | ICD-10-CM | POA: Diagnosis not present

## 2022-04-03 DIAGNOSIS — F01A0 Vascular dementia, mild, without behavioral disturbance, psychotic disturbance, mood disturbance, and anxiety: Secondary | ICD-10-CM

## 2022-04-03 NOTE — Progress Notes (Signed)
[facility] clinic  Provider:  Kenard Gower DNP  Code Status:   Aroldo Galli Medina-Vargas DNP  Goals of Care:     03/25/2022    5:10 PM  Advanced Directives  Does Patient Have a Medical Advance Directive? No  Would patient like information on creating a medical advance directive? No - Patient declined     Chief Complaint  Patient presents with   Medical Management of Chronic Issues    Patient is here for a one month follow up for chronic falls and URI    HPI: Patient is a 72 y.o. male seen today for follow up on falls and URI.He has a PMH of atrial fibrillation on Eliquis, heart failure and mild dementia. He was seen in the ED on 03/25/21 after a fall at home. He has fallen twice and hit his head. He complained of left lower rib cage pain and imaging was negative for fracture. He stated that he waited for 4-5 hours in the ED and left before imaging of brain was done.  He was accompanied today by his wife. He denies having left rib cage pain nor headache. He denies falling again.   Past Medical History:  Diagnosis Date   Anxiety    Aortic atherosclerosis (HCC)    CHF (congestive heart failure) (HCC) 02/01/2021   a.) TTE 02/01/2021: EF 40%, mild LVH, mild LAE, midl PR, mod TR, G2DD.   COPD (chronic obstructive pulmonary disease) (HCC)    Depression    Diverticulosis    Frequent PVCs    Hiatal hernia    History of kidney stones    Hyperlipidemia    Hypertension    Hypertrophy of prostate with urinary obstruction and other lower urinary tract symptoms (LUTS)    Infrarenal abdominal aortic aneurysm (AAA) without rupture (HCC) 09/24/2018   a.) CTA CAP 09/24/2018: 4.2 x 4.2 cm; b.) CT abd abd 05/29/2019: 4.0 cm; c.) aorta duplex 12/30/2020: 4.14 x 4.08 x 4.19 cm; d.) CT abd/pel 02/21/2021: 4.6 cm; e.) CT renal 04/01/2021: 4.3 cm; f.) CT renal 04/19/2021: 4.6 cm   Insomnia, unspecified    a.) on trazodone PRN   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) 01/29/2021   in hospital with sepsis    Long term current use of amiodarone    Long term current use of anticoagulant    a.) apixaban   Memory difficulties    a.) on memantine   Mood disorder (HCC)    Bipolar versus schizophrenia   Neuropathy of right foot    a.) s/p hip surgery   Osteoporosis    a.) on oral bisphosphonate   PAF (paroxysmal atrial fibrillation) (HCC)    a.) holter 07/25/2020: NSR, PAF/flutter (14% burden) with longest lasting 45 minutes, freq PACs (24.6% burden); b.) holter 06/01/2021: NSR, episodes of NSVT, SVT (longest lasting 20 beats), freq PACs (29.3% burden), PVCs (2.6% burden), vent bi/trigeminy   RBBB (right bundle branch block)    Restless leg syndrome    a.) on ropinirole   Wears glasses    Wears partial dentures    top and bottom partial    Past Surgical History:  Procedure Laterality Date   CYSTOSCOPY WITH LITHOLAPAXY N/A 05/03/2021   Procedure: CYSTOSCOPY WITH LITHOLAPAXY holmium laser;  Surgeon: Despina Arias, MD;  Location: [facility];  Service: Urology;  Laterality: N/A;   HIP ARTHROPLASTY Right 2005   HOLEP-LASER ENUCLEATION OF THE PROSTATE WITH MORCELLATION N/A 01/20/2022   Procedure: HOLEP-LASER ENUCLEATION OF THE PROSTATE WITH MORCELLATION;  Surgeon:  Sondra Come, MD;  Location: [facility];  Service: Urology;  Laterality: N/A;   INGUINAL HERNIA REPAIR Bilateral 08/16/2012   Procedure: LAPAROSCOPIC BILATERAL INGUINAL HERNIA REPAIR;  Surgeon: Wilmon Arms. Corliss Skains, MD;  Location: [facility];  Service: General;  Laterality: Bilateral;   INSERTION OF MESH Bilateral 08/16/2012   Procedure: INSERTION OF MESH;  Surgeon: Wilmon Arms. Corliss Skains, MD;  Location: [facility];  Service: General;  Laterality: Bilateral;   MULTIPLE TOOTH EXTRACTIONS     TONSILLECTOMY     age 19   UPPER GI ENDOSCOPY      Allergies  Allergen Reactions   Gabapentin Other (See Comments)    crazy   Tylenol [Acetaminophen] Other (See Comments)    Hurts his kidneys    Outpatient Encounter  Medications as of 04/03/2022  Medication Sig   amiodarone (PACERONE) 200 MG tablet Take 1 tablet (200 mg total) by mouth daily. Monday - Friday only   apixaban (ELIQUIS) 5 MG TABS tablet Take 1 tablet (5 mg total) by mouth 2 (two) times daily.   doxazosin (CARDURA) 8 MG tablet Take 1 tablet (8 mg total) by mouth daily.   ibandronate (BONIVA) 150 MG tablet Take 150 mg by mouth every 30 (thirty) days. 3rd of each month   memantine (NAMENDA) 5 MG tablet TAKE 1 TABLET (5 MG AT NIGHT) FOR 2 WEEKS, THEN INCREASE TO 1 TABLET (5 MG) TWICE A DAY   metoprolol tartrate (LOPRESSOR) 25 MG tablet Take 0.5 tablets (12.5 mg total) by mouth 2 (two) times daily. Hold for SBP <105 and HR <60   ondansetron (ZOFRAN) 4 MG tablet Take 1 tablet (4 mg total) by mouth every 8 (eight) hours as needed for nausea or vomiting.   OVER THE COUNTER MEDICATION 2 (two) times daily. Mens over 50 vitamin pack   rOPINIRole (REQUIP) 1 MG tablet Take 1 mg by mouth 2 (two) times daily.   traMADol (ULTRAM) 50 MG tablet Take 1 tablet (50 mg total) by mouth every 12 (twelve) hours as needed (pain).   traZODone (DESYREL) 100 MG tablet Take 0.5 tablets (50 mg total) by mouth at bedtime.   [DISCONTINUED] guaiFENesin (MUCINEX) 600 MG 12 hr tablet Take 1 tablet (600 mg total) by mouth 2 (two) times daily for 14 days.   No facility-administered encounter medications on file as of 04/03/2022.    Review of Systems:  Review of Systems  Constitutional:  Negative for activity change, appetite change and fever.  HENT:  Negative for sore throat.   Eyes: Negative.   Cardiovascular:  Negative for chest pain and leg swelling.  Gastrointestinal:  Negative for abdominal distention, diarrhea and vomiting.  Genitourinary:  Negative for dysuria, frequency and urgency.  Skin:  Negative for color change.  Neurological:  Negative for dizziness and headaches.  Psychiatric/Behavioral:  Negative for behavioral problems and sleep disturbance. The patient is not  nervous/anxious.     Health Maintenance  Topic Date Due   Pneumonia Vaccine 85+ Years old (1 - PCV) Never done   COLONOSCOPY (Pts 45-74yrs Insurance coverage will need to be confirmed)  Never done   Zoster Vaccines- Shingrix (1 of 2) Never done   INFLUENZA VACCINE  Never done   Medicare Annual Wellness (AWV)  10/20/2021   COVID-19 Vaccine (3 - 2023-24 season) 11/11/2021   DTaP/Tdap/Td (2 - Td or Tdap) 04/10/2024   Hepatitis C Screening  Completed   HPV VACCINES  Aged Out    Physical Exam: Vitals:  04/03/22 1028  BP: 100/80  Pulse: 72  Resp: 18  Temp: 97.7 F (36.5 C)  SpO2: 92%  Weight: 170 lb (77.1 kg)  Height: 5\' 10"  (1.778 m)   Body mass index is 24.39 kg/m. Physical Exam Constitutional:      Appearance: Normal appearance.  HENT:     Head: Normocephalic and atraumatic.     Mouth/Throat:     Mouth: Mucous membranes are moist.  Eyes:     Conjunctiva/sclera: Conjunctivae normal.  Cardiovascular:     Rate and Rhythm: Normal rate and regular rhythm.     Pulses: Normal pulses.     Heart sounds: Normal heart sounds.  Pulmonary:     Effort: Pulmonary effort is normal.     Breath sounds: Normal breath sounds.  Abdominal:     General: Bowel sounds are normal.     Palpations: Abdomen is soft.  Musculoskeletal:        General: No swelling. Normal range of motion.     Cervical back: Normal range of motion.  Skin:    General: Skin is warm and dry.  Neurological:     General: No focal deficit present.     Mental Status: He is alert and oriented to person, place, and time.  Psychiatric:        Mood and Affect: Mood normal.        Behavior: Behavior normal.        Thought Content: Thought content normal.        Judgment: Judgment normal.     Labs reviewed: Basic Metabolic Panel: Recent Labs    08/18/21 1216 09/22/21 1517 11/13/21 1710 11/29/21 0835 03/25/22 1814  NA 140  --  136  --  135  K 3.5  --  3.4*  --  3.8  CL 111  --  106  --  104  CO2 23  --   20*  --  20*  GLUCOSE 90  --  141*  --  99  BUN 17  --  21  --  17  CREATININE 0.99  --  1.11  --  1.12  CALCIUM 8.6*  --  8.6*  --  8.6*  TSH  --  0.14*  --  0.95  --    Liver Function Tests: Recent Labs    04/19/21 0045 04/20/21 0510 08/18/21 1216  AST 16 13* 13*  ALT 18 16 12   ALKPHOS 58 50 44  BILITOT 0.5 0.8 0.5  PROT 7.4 7.1 6.2*  ALBUMIN 3.8 3.7 3.1*   No results for input(s): "LIPASE", "AMYLASE" in the last 8760 hours. No results for input(s): "AMMONIA" in the last 8760 hours. CBC: Recent Labs    08/18/21 1216 11/13/21 1710 03/25/22 1814  WBC 6.7 20.7* 10.5  NEUTROABS 4.2 17.6* 7.6  HGB 12.2* 13.7 13.3  HCT 36.8* 40.4 40.8  MCV 92.7 88.8 86.1  PLT 150 178 128*   Lipid Panel: Recent Labs    11/29/21 0835  CHOL 179  HDL 63  LDLCALC 101*  TRIG 64  CHOLHDL 2.8   Lab Results  Component Value Date   HGBA1C 5.5 11/29/2021    Procedures since last visit: DG Ribs Unilateral W/Chest Left  Result Date: 03/25/2022 CLINICAL DATA:  Left lower rib cage pain. EXAM: LEFT RIBS AND CHEST - 3+ VIEW COMPARISON:  Chest radiograph 04/18/2021 FINDINGS: No fracture or other bone lesions are seen involving the ribs. There is no evidence of pneumothorax or pleural effusion. No new focal  consolidation in the lungs. No pneumothorax or pleural effusion. IMPRESSION: Negative. Electronically Signed   By: Audie Pinto M.D.   On: 03/25/2022 17:54    Assessment/Plan  1. PAF (paroxysmal atrial fibrillation) (HCC) -  rate-controlled -  continue Amiodarone and Eliquis  2. Benign prostatic hyperplasia without lower urinary tract symptoms -  denies urinary retention -  continue Cardura  3. RLS (restless legs syndrome) -  stable -  continue Requip  4. Chronic midline low back pain without sciatica -  stable, continue Tramadol PRN  5. Primary insomnia -  verbalized sleeping at night -  continue Trazodone  6. 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

## 2022-04-20 ENCOUNTER — Encounter: Payer: Self-pay | Admitting: Urology

## 2022-04-20 ENCOUNTER — Ambulatory Visit: Payer: Medicare Other | Admitting: Urology

## 2022-04-20 VITALS — BP 120/65 | HR 77 | Ht 72.0 in | Wt 174.8 lb

## 2022-04-20 DIAGNOSIS — N138 Other obstructive and reflux uropathy: Secondary | ICD-10-CM | POA: Diagnosis not present

## 2022-04-20 DIAGNOSIS — N401 Enlarged prostate with lower urinary tract symptoms: Secondary | ICD-10-CM | POA: Diagnosis not present

## 2022-04-20 DIAGNOSIS — N200 Calculus of kidney: Secondary | ICD-10-CM

## 2022-04-20 DIAGNOSIS — R339 Retention of urine, unspecified: Secondary | ICD-10-CM

## 2022-04-20 NOTE — Progress Notes (Signed)
   04/20/2022 1:59 PM   Carlos Short. [birth year] [id number]  Reason for visit: Follow up BPH and urinary retention status post HOLEP  HPI: Comorbid 72 year old male with dementia who is here with his wife today who again provides most of the history.  He was referred from [facility] to consider HOLEP.  He had urinary retention that was managed with CIC 3-4 times a day over the last year.  He underwent an uncomplicated HOLEP with me on 01/20/2022.  He passed a voiding trial postoperatively and has been doing very well since that time.  He is urinating with a strong stream, has not had to catheterize.  Hematuria has resolved.  Denies any incontinence at this point.  Bladder scan today 255ml, but denies the urge to void and last voided a few hours ago.  Return precautions were discussed, can follow-up for yearly PVR with his local urologist in [place].  He also has known nonobstructing left renal stones, consider yearly KUB stone surveillance as well  [place], [place] [address], [place], [place] [address] [phone]

## 2022-04-24 ENCOUNTER — Other Ambulatory Visit: Payer: Self-pay | Admitting: Adult Health

## 2022-04-24 DIAGNOSIS — F5101 Primary insomnia: Secondary | ICD-10-CM

## 2022-04-24 NOTE — Telephone Encounter (Signed)
Patient is requesting 3 month supply of medication  Medication pended and sent to Durenda Age, NP

## 2022-04-26 ENCOUNTER — Encounter: Payer: Self-pay | Admitting: Physician Assistant

## 2022-04-26 ENCOUNTER — Ambulatory Visit: Payer: Medicare Other | Admitting: Physician Assistant

## 2022-04-26 DIAGNOSIS — Z029 Encounter for administrative examinations, unspecified: Secondary | ICD-10-CM
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
## 2022-05-23 ENCOUNTER — Ambulatory Visit: Payer: Medicare Other | Admitting: Psychology

## 2022-05-23 ENCOUNTER — Encounter: Payer: Self-pay | Admitting: Psychology

## 2022-05-23 DIAGNOSIS — F319 Bipolar disorder, unspecified: Secondary | ICD-10-CM | POA: Diagnosis not present

## 2022-05-23 DIAGNOSIS — F03A3 Unspecified dementia, mild, with mood disturbance: Secondary | ICD-10-CM

## 2022-05-23 DIAGNOSIS — F411 Generalized anxiety disorder: Secondary | ICD-10-CM | POA: Insufficient documentation

## 2022-05-23 DIAGNOSIS — E785 Hyperlipidemia, unspecified: Secondary | ICD-10-CM | POA: Insufficient documentation

## 2022-05-23 DIAGNOSIS — F329 Major depressive disorder, single episode, unspecified: Secondary | ICD-10-CM | POA: Insufficient documentation

## 2022-05-23 DIAGNOSIS — R4189 Other symptoms and signs involving cognitive functions and awareness: Secondary | ICD-10-CM

## 2022-05-23 DIAGNOSIS — I700 Atherosclerosis of aorta: Secondary | ICD-10-CM | POA: Insufficient documentation

## 2022-05-23 NOTE — Progress Notes (Addendum)
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 finding difficulties were not observed during the clinical interview. Thought processes were coherent, organized, and normal in content. Insight into his cognitive difficulties appeared adequate. However, there does remain the potential for limited insight in that the extent of cognitive impairment may be greater than what he is able to fully appreciate.   During testing, frustration and testing tolerance were variable but often poor. He quickly expressed annoyance with testing, asking "how much more of this do we have to do" after testing had progressed only a very short duration. There were many instances where Mr. Schierer immediately stated that he did not have an answer, giving the appearance of diminished effort. There were also many other instances of suspected limited engagement. Across a simple dot counting task, he was noted to suggest one page having well over over 100 dots, very far from the correct response. When this was pointed out to him, he simply responded "that is part of my brain damage." Across memory testing, he commonly made  comments surrounding testing making him feel "stupid" and provided minimal engagement.  When presented with yes/no recognition aspects of memory testing, he quickly became quite agitated, very sternly stating that he had no recollection, and refused to answer yes/no questions, causing these tasks to be discontinued. Across a visuomotor sequencing task (TMT B), he appeared to ignore instructions of how to draw lines and did not respond well when the psychometrist tried to re-explain directions, again quickly becoming agitated and frustrated. Overall, while he was pleasant and cooperative during interview portions of the current evaluation, frustration and testing tolerance were of noteworthy issue during testing procedures.   Adequacy of Effort: The validity of neuropsychological testing is limited by the extent to which the individual being tested may be assumed to have exerted adequate effort during testing. Scores across stand-alone and embedded performance validity measures were variable. Additionally, as noted above, there were numerous behavioral observations which suggested variable and often poor testing engagement and tolerance. As such, the results of the current evaluation should be interpreted with caution and it is unclear how much of an underestimation current low performances are relative to true abilities.  Test Results: Mr. Christe was very  disoriented at the time of the current evaluation. He was unable to state his address. He was also unable to state the current year, month, date, day of the week, time, or name of the current clinic.   Intellectual abilities based upon educational and vocational attainment were estimated to be in the average range. Premorbid abilities were estimated to be within the above average range based upon a single-word reading test.   Processing speed was below average. Basic attention was below average. More complex attention (e.g., working memory) was unable to  be assessed given variable and often poor testing/frustration tolerance. Executive functioning was unable to be assessed.  Receptive language abilities were unable to be directly assessed. Mr. Pieske was able to answer all questions asked of him adequately during interview. Assessed expressive language was somewhat variable. Phonemic fluency was average, semantic fluency was exceptionally low, and confrontation naming was average across a screening task but exceptionally low across a more comprehensive task.   Assessed visuospatial/visuoconstructional abilities were largely below average to average. Across his drawing of a clock, he included the number 11 twice and incorrectly placed the clock hands.     Learning (i.e., encoding) of novel verbal information was exceptionally low. Spontaneous delayed recall (i.e., retrieval) of previously learned information was also exceptionally low. Retention rates were 0% across a story learning task, 0% across a list learning task, and 0% across a figure drawing task. Recognition/consolidation abilities were unable to be assessed.   Results of emotional screening instruments suggested that recent symptoms of generalized anxiety were in the severe range, while symptoms of depression were within the mild range. A screening instrument assessing recent sleep quality suggested the presence of mild sleep dysfunction.  Tables of Scores:   Note: This summary of test scores accompanies the interpretive report and should not be considered in isolation without reference to the appropriate sections in the text. Descriptors are based on appropriate normative data and may be adjusted based on clinical judgment. Terms such as "Within Normal Limits" and "Outside Normal Limits" are used when a more specific description of the test score cannot be determined.       Percentile - Normative Descriptor > 98 - Exceptionally High 91-97 - Well Above Average 75-90 - Above Average 25-74  - Average 9-24 - Below Average 2-8 - Well Below Average < 2 - Exceptionally Low       Validity:   DESCRIPTOR       Dot Counting Test: --- --- Within Normal Limits  RBANS Effort Index: --- --- Outside Normal Limits       Orientation:      Raw Score Percentile   NAB Orientation, Form 1 17/29 --- ---       Cognitive Screening:      Raw Score Percentile   SLUMS: 9/30 --- ---       RBANS, Form A: Standard Score/ Scaled Score Percentile   Total Score --- --- ---  Immediate Memory 40 <1 Exceptionally Low    List Learning 1 <1 Exceptionally Low    Story Memory 1 <1 Exceptionally Low  Visuospatial/Constructional 87 19 Below Average    Figure Copy 7 16 Below Average    Line Orientation 16/20 26-50 Average  Language 78 7 Well Below Average    Picture Naming 9/10 26-50 Average    Semantic Fluency 2 <1 Exceptionally Low  Attention 79 8 Well Below Average    Digit Span 7 16 Below Average    Coding 6 9  Below Average  Delayed Memory --- --- ---    List Recall 0/10 <2 Exceptionally Low    List Recognition Discontinued (pt agitation) --- ---    Story Recall 1 <1 Exceptionally Low    Story Recognition Discontinued (pt agitation) --- ---    Figure Recall 1 <1 Exceptionally Low    Figure Recognition Discontinued (pt agitation) --- ---        Intellectual Functioning:      Standard Score Percentile   Test of Premorbid Functioning: 110 75 Above Average       Attention/Executive Function:     Trail Making Test (TMT): Raw Score (T Score) Percentile     Part A 50 secs.,  1 error (39) 14 Below Average    Part B Discontinued (pt agitation) --- ---        Language:     Verbal Fluency Test: Raw Score (T Score) Percentile     Phonemic Fluency (FAS) 30 (43) 25 Average    Animal Fluency 6 (19) <1 Exceptionally Low        NAB Language Module, Form 1: T Score Percentile     Naming 22/31 (21) <1 Exceptionally Low       Visuospatial/Visuoconstruction:      Raw Score Percentile   Clock  Drawing: 6/10 --- Impaired        Scaled Score Percentile   WAIS-IV Visual Puzzles: 6 9 Below Average       Mood and Personality:      Raw Score Percentile   Geriatric Depression Scale: 14 --- Mild  Geriatric Anxiety Scale: 28 --- Severe    Somatic 13 --- Severe    Cognitive 8 --- Moderate    Affective 7 --- Moderate       Additional Questionnaires:      Raw Score Percentile   PROMIS Sleep Disturbance Questionnaire: 25 --- Mild   Informed Consent and Coding/Compliance:   The current evaluation represents a clinical evaluation for the purposes previously outlined by the referral source and is in no way reflective of a forensic evaluation.   Mr. Wilcott was provided with a verbal description of the nature and purpose of the present neuropsychological evaluation. Also reviewed were the foreseeable risks and/or discomforts and benefits of the procedure, limits of confidentiality, and mandatory reporting requirements of this provider. The patient was given the opportunity to ask questions and receive answers about the evaluation. Oral consent to participate was provided by the patient.   This evaluation was conducted by Christia Reading, Ph.D., ABPP-CN, board certified clinical neuropsychologist. Mr. Tetterton completed a clinical interview with Dr. Melvyn Novas, billed as one unit [phone], and 100 minutes of cognitive testing and scoring, billed as one unit [phone] and two additional units [id number]. Psychometrist Milana Kidney, B.S., assisted Dr. Melvyn Novas with test administration and scoring procedures. As a separate and discrete service, Dr. Melvyn Novas spent a total of 160 minutes in interpretation and report writing billed as one unit [phone] and two units 96133.

## 2022-05-23 NOTE — Progress Notes (Signed)
   Psychometrician Note   Cognitive testing was administered to Carlos Short. by Milana Kidney, B.S. (psychometrist) under the supervision of Dr. Christia Reading, Ph.D., licensed psychologist on 05/23/2022. Carlos Short did not appear overtly distressed by the testing session per behavioral observation or responses across self-report questionnaires. Rest breaks were offered.    The battery of tests administered was selected by Dr. Christia Reading, Ph.D. with consideration to Carlos Short current level of functioning, the nature of his symptoms, emotional and behavioral responses during interview, level of literacy, observed level of motivation/effort, and the nature of the referral question. This battery was communicated to the psychometrist. Communication between Dr. Christia Reading, Ph.D. and the psychometrist was ongoing throughout the evaluation and Dr. Christia Reading, Ph.D. was immediately accessible at all times. Dr. Christia Reading, Ph.D. provided supervision to the psychometrist on the date of this service to the extent necessary to assure the quality of all services provided.    Carlos Short. will return within approximately 1-2 weeks for an interactive feedback session with Dr. Melvyn Novas at which time his test performances, clinical impressions, and treatment recommendations will be reviewed in detail. Carlos Short understands he can contact our office should he require our assistance before this time.  A total of 100 minutes of billable time were spent face-to-face with Carlos Short by the psychometrist. This includes both test administration and scoring time. Billing for these services is reflected in the clinical report generated by Dr. Christia Reading, Ph.D.  This note reflects time spent with the psychometrician and does not include test scores or any clinical interpretations made by Dr. Melvyn Novas. The full report will follow in a separate note.

## 2022-05-24 ENCOUNTER — Encounter: Payer: Self-pay | Admitting: Psychology

## 2022-05-25 ENCOUNTER — Encounter: Payer: Self-pay | Admitting: Internal Medicine

## 2022-05-25 ENCOUNTER — Ambulatory Visit: Payer: Medicare Other | Attending: Internal Medicine | Admitting: Internal Medicine

## 2022-05-25 VITALS — BP 112/68 | HR 53 | Ht 71.0 in | Wt 171.2 lb

## 2022-05-25 DIAGNOSIS — I493 Ventricular premature depolarization: Secondary | ICD-10-CM | POA: Diagnosis not present

## 2022-05-25 DIAGNOSIS — I480 Paroxysmal atrial fibrillation: Secondary | ICD-10-CM | POA: Diagnosis not present

## 2022-05-25 NOTE — Patient Instructions (Addendum)
Medication Instructions:  Your physician recommends that you continue on your current medications as directed. Please refer to the Current Medication list given to you today.  *If you need a refill on your cardiac medications before your next appointment, please call your pharmacy*  Lab Work: Labs today, TSH, LFT, Sed Rate, and BMET.   Testing/Procedures: None ordered.  Follow-Up: At [facility], you and your health needs are our priority.  As part of our continuing mission to provide you with exceptional heart care, we have created designated Provider Care Teams.  These Care Teams include your primary Cardiologist (physician) and Advanced Practice Providers (APPs -  Physician Assistants and Nurse Practitioners) who all work together to provide you with the care you need, when you need it.   Your next appointment:   We will follow up with you when your lab results are posted, and a follow up appointment will be determined by your lab results.   The format for your next appointment:   In Person  Provider:   Cristopher Peru, MD{or one of the following Advanced Practice Providers on your designated Care Team:   Tommye Standard, [place] Legrand Como "[facility]" [place], [place]

## 2022-05-25 NOTE — Progress Notes (Signed)
HPI [name] returns for ongoing evaluation of PVC's and PAC's. He is a pleasant 72 yo man with chronic systolic heart failure, EF 40%, who has been treated with GDMT. He was found to have fairly dense symptomatic atrial ectopy as well as ventricular ectopy such that over 35% of his heart beats were premature. He has multiple other problems. He has a multitude of other medical problems including a AAA, COPD, and has been on multaq for his arrhythmias previously which were stopped. When I saw him he was placed on amiodarone. He did not stop the multaq. He is now better. He has reduced his dose of amio to 200 mg daily, none on S/S.  Allergies  Allergen Reactions   Gabapentin Other (See Comments)    crazy   Tylenol [Acetaminophen] Other (See Comments)    Hurts his kidneys     Current Outpatient Medications  Medication Sig Dispense Refill   amiodarone (PACERONE) 200 MG tablet Take 1 tablet (200 mg total) by mouth daily. Monday - Friday only 90 tablet 3   apixaban (ELIQUIS) 5 MG TABS tablet Take 1 tablet (5 mg total) by mouth 2 (two) times daily. 60 tablet 3   doxazosin (CARDURA) 8 MG tablet Take 1 tablet (8 mg total) by mouth daily.     ibandronate (BONIVA) 150 MG tablet Take 150 mg by mouth every 30 (thirty) days. 3rd of each month     memantine (NAMENDA) 5 MG tablet TAKE 1 TABLET (5 MG AT NIGHT) FOR 2 WEEKS, THEN INCREASE TO 1 TABLET (5 MG) TWICE A DAY 180 tablet 4   ondansetron (ZOFRAN) 4 MG tablet Take 1 tablet (4 mg total) by mouth every 8 (eight) hours as needed for nausea or vomiting. 30 tablet 0   OVER THE COUNTER MEDICATION 2 (two) times daily. Mens over 50 vitamin pack     rOPINIRole (REQUIP) 2 MG tablet TAKE ONE TABLET DURING THE DAY AND 2 TABLETS A NIGHT AS NEEDED FOR RESTLESS LEGS.     traMADol (ULTRAM) 50 MG tablet Take 1 tablet (50 mg total) by mouth every 12 (twelve) hours as needed (pain). 30 tablet 0   traZODone (DESYREL) 100 MG tablet TAKE 0.5 TABLETS (50 MG TOTAL)  BY MOUTH AT BEDTIME. 45 tablet 1   No current facility-administered medications for this visit.     Past Medical History:  Diagnosis Date   Acute gastritis without hemorrhage 11/09/2015   Acute lower UTI 99991111   Acute metabolic encephalopathy 99991111   Aneurysm of infrarenal abdominal aorta 06/13/2017   Infrarenal abdominal aortic aneurysm first noted on MRI of lumbar spine on 06/13/2017 measuring maximum diameter of 4.2 cm. CTA abd/pelvis on 09/24/18 showed 4.2 x 4.2 cm.   Both times, the radiologist recommended follow up with ultrasound in 1 year. Records in Care Everywhere F.W.   Aortic atherosclerosis    Benign nevus of skin 02/17/2019   Benign prostatic hyperplasia with weak urinary stream 04/13/2016   Bilateral inguinal hernia 07/23/2012   Bipolar 1 disorder    Bladder calculus 04/19/2021   CHF (congestive heart failure) 02/01/2021   a.) TTE 02/01/2021: EF 40%, mild LVH, mild LAE, midl PR, mod TR, G2DD.   Chronic anticoagulation 04/20/2021   Chronic insomnia 01/09/2018   Chronic low back pain without sciatica 04/11/2017   Chronic pain syndrome 04/13/2016   Chronic right hip pain 04/12/2018   Closed compression fracture of L1 vertebra [id number]   Complicated urinary tract infection 04/01/2021  COPD (chronic obstructive pulmonary disease)    Dementia, unclear etiology 09/14/2021   Diverticulosis    Frequent PVCs    Generalized anxiety disorder    HFrEF (heart failure with reduced ejection fraction) 04/19/2021   Hiatal hernia    HTN (hypertension) 04/19/2021   Hyperlipidemia    Hypertrophy of prostate with urinary obstruction and other lower urinary tract symptoms (LUTS)    Infrarenal abdominal aortic aneurysm (AAA) without rupture 09/24/2018   a.) CTA CAP 09/24/2018: 4.2 x 4.2 cm; b.) CT abd abd 05/29/2019: 4.0 cm; c.) aorta duplex 12/30/2020: 4.14 x 4.08 x 4.19 cm; d.) CT abd/pel 02/21/2021: 4.6 cm; e.) CT renal 04/01/2021: 4.3 cm; f.) CT renal 04/19/2021: 4.6 cm    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   in hospital with sepsis   Long term current use of amiodarone    Long term current use of anticoagulant    a.) apixaban   Major depressive disorder    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   Nephrolithiasis 04/19/2021   Neuropathy of right foot    a.) s/p hip surgery   Osteopenia of multiple sites 01/10/2018   Osteoporosis    a.) on oral bisphosphonate   PAC (premature atrial contraction) 08/05/2021   PAF (paroxysmal atrial fibrillation)    a.) holter 07/25/2020: NSR, PAF/flutter (14% burden) with longest lasting 45 minutes, freq PACs (24.6% burden); b.) holter 06/01/2021: NSR, episodes of NSVT, SVT (longest lasting 20 beats), freq PACs (29.3% burden), PVCs (2.6% burden), vent bi/trigeminy   RBBB (right bundle branch block)    Restless leg syndrome    a.) on ropinirole   Seborrhea 10/20/2018   Seborrheic keratoses 02/09/2019   Sepsis secondary to UTI 12/31/2020   Thrombocytopenia 12/31/2020   Urinary retention 04/19/2021   UTI (urinary tract infection) 04/19/2021   Wears glasses    Wears partial dentures    top and bottom partial    ROS:   All systems reviewed and negative except as noted in the HPI.   Past Surgical History:  Procedure Laterality Date   CYSTOSCOPY WITH LITHOLAPAXY N/A 05/03/2021   Procedure: CYSTOSCOPY WITH LITHOLAPAXY holmium laser;  Surgeon: Vira Agar, MD;  Location: [facility];  Service: Urology;  Laterality: N/A;   HIP ARTHROPLASTY Right 2005   HOLEP-LASER ENUCLEATION OF THE PROSTATE WITH MORCELLATION N/A 01/20/2022   Procedure: HOLEP-LASER ENUCLEATION OF THE PROSTATE WITH MORCELLATION;  Surgeon: Billey Co, MD;  Location: [facility];  Service: Urology;  Laterality: N/A;   INGUINAL HERNIA REPAIR Bilateral 08/16/2012   Procedure: LAPAROSCOPIC BILATERAL INGUINAL HERNIA REPAIR;  Surgeon: Imogene Burn. Georgette Dover, MD;  Location: [facility];  Service: General;  Laterality: Bilateral;   INSERTION OF MESH Bilateral  08/16/2012   Procedure: INSERTION OF MESH;  Surgeon: Imogene Burn. Georgette Dover, MD;  Location: [facility];  Service: General;  Laterality: Bilateral;   MULTIPLE TOOTH EXTRACTIONS     TONSILLECTOMY     age 72   UPPER GI ENDOSCOPY       Family History  Problem Relation Age of Onset   Depression Mother    Hypertension Father    Heart attack Father 42       2 HEART ATTACKS   Kidney cancer Father    Lupus Father    Alzheimer's disease Father    Dementia Father    Heart disease Brother    Depression Brother    Healthy Half-Brother    Migraines Neg Hx      Social History  Socioeconomic History   Marital status: Married    Spouse name: Not on file   Number of children: 2   Years of education: 63   Highest education level: Some college, no degree  Occupational History   Occupation: Artist  Tobacco Use   Smoking status: Every Day    Packs/day: 0.25    Years: 50.00    Additional pack years: 0.00    Total pack years: 12.50    Types: Cigarettes   Smokeless tobacco: Never  Vaping Use   Vaping Use: Never used  Substance and Sexual Activity   Alcohol use: Not Currently    Comment: was an alchoholic, 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   Drug use: Yes    Types: Marijuana    Comment: [place], still currently still uses marijuana QD   Sexual activity: Not Currently  Other Topics Concern   Not on file  Social History Narrative   2 adopted children   Right handed   Drinks coffee   2 story home   Social Determinants of Health   Financial Resource Strain: Not on file  Food Insecurity: Not on file  Transportation Needs: Not on file  Physical Activity: Not on file  Stress: Not on file  Social Connections: Not on file  Intimate Partner Violence: Not on file     BP 112/68   Pulse (!) 53   Ht '5\' 11"'$  (1.803 m)   Wt 171 lb 3.2 oz (77.7 kg)   SpO2 99%   BMI 23.88 kg/m   Physical Exam:  Well appearing NAD HEENT: Unremarkable Neck:  No JVD, no thyromegally Lymphatics:   No adenopathy Back:  No CVA tenderness Lungs:  scattered rales HEART:  Regular rate rhythm, no murmurs, no rubs, no clicks Abd:  soft, positive bowel sounds, no organomegally, no rebound, no guarding Ext:  2 plus pulses, no edema, no cyanosis, no clubbing Skin:  No rashes no nodules Neuro:  CN II through XII intact, motor grossly intact  EKG - nsr  DEVICE  Normal device function.  See PaceArt for details.   Assess/Plan:  Frequent atrial and ventricular ectopy - He is much improved on low dose amiodarone and he will continue.  Coags - he has not had any bleeding on eliquis. Continue. HTN - his bp is well controlled. We will follow.   Carleene Overlie Lazaria Schaben,MD

## 2022-05-26 ENCOUNTER — Telehealth: Payer: Self-pay

## 2022-05-26 DIAGNOSIS — E039 Hypothyroidism, unspecified: Secondary | ICD-10-CM

## 2022-05-26 LAB — BASIC METABOLIC PANEL
BUN/Creatinine Ratio: 13 (ref 10–24)
BUN: 16 mg/dL (ref 8–27)
CO2: 23 mmol/L (ref 20–29)
Calcium: 9 mg/dL (ref 8.6–10.2)
Chloride: 106 mmol/L (ref 96–106)
Creatinine, Ser: 1.21 mg/dL (ref 0.76–1.27)
Glucose: 81 mg/dL (ref 70–99)
Potassium: 3.8 mmol/L (ref 3.5–5.2)
Sodium: 143 mmol/L (ref 134–144)
eGFR: 64 mL/min/{1.73_m2} (ref >59)

## 2022-05-26 LAB — HEPATIC FUNCTION PANEL
ALT: 12 IU/L (ref 0–44)
AST: 17 IU/L (ref 0–40)
Albumin: 4 g/dL (ref 3.8–4.8)
Alkaline Phosphatase: 75 IU/L (ref 44–121)
Bilirubin Total: 0.4 mg/dL (ref 0.0–1.2)
Bilirubin, Direct: 0.11 mg/dL (ref 0.00–0.40)
Total Protein: 6.1 g/dL (ref 6.0–8.5)

## 2022-05-26 LAB — TSH: TSH: 0.308 u[IU]/mL — ABNORMAL LOW (ref 0.450–4.500)

## 2022-05-26 LAB — SEDIMENTATION RATE: Sed Rate: 9 mm/hr (ref 0–30)

## 2022-05-26 NOTE — Telephone Encounter (Signed)
-----   Message from Evans Lance, MD sent at 05/25/2022  9:36 PM EDT ----- TSH a little low. Recheck in 3 months. No other changes.

## 2022-05-26 NOTE — Telephone Encounter (Signed)
Left detailed message per DPR with lab results and advised to call back for lab appointment in 3 months to recheck TSH level.

## 2022-05-29 ENCOUNTER — Ambulatory Visit: Payer: Medicare Other | Admitting: Psychology

## 2022-05-29 ENCOUNTER — Other Ambulatory Visit: Payer: Self-pay | Admitting: Adult Health

## 2022-05-29 ENCOUNTER — Telehealth: Payer: Self-pay

## 2022-05-29 DIAGNOSIS — F03A3 Unspecified dementia, mild, with mood disturbance: Secondary | ICD-10-CM

## 2022-05-29 DIAGNOSIS — R7989 Other specified abnormal findings of blood chemistry: Secondary | ICD-10-CM

## 2022-05-29 DIAGNOSIS — F319 Bipolar disorder, unspecified: Secondary | ICD-10-CM | POA: Diagnosis not present

## 2022-05-29 DIAGNOSIS — F411 Generalized anxiety disorder: Secondary | ICD-10-CM

## 2022-05-29 DIAGNOSIS — I480 Paroxysmal atrial fibrillation: Secondary | ICD-10-CM

## 2022-05-29 DIAGNOSIS — I10 Essential (primary) hypertension: Secondary | ICD-10-CM

## 2022-05-29 NOTE — Telephone Encounter (Signed)
-----   Message from Tor Netters, RN sent at 05/26/2022 11:20 AM EDT ----- Patient notified of result.  Please refer to phone note from today for complete details.

## 2022-05-29 NOTE — Progress Notes (Signed)
   Neuropsychology Feedback Session Carlos Short. [facility]  Reason for Referral:   Carlos Teakell. is a 72 y.o. right-handed Caucasian male referred by Sharene Butters, PA-C, to characterize his current cognitive functioning and assist with diagnostic clarity and treatment planning in the context of subjective cognitive decline and numerous medical and psychiatric comorbidities.   Feedback:   [name] completed a comprehensive neuropsychological evaluation on 05/23/2022. Please refer to that encounter for the full report and recommendations. If taken at face value (caution was advised), [name] pattern of performance is suggestive of severe impairment surrounding semantic fluency and all aspects of learning and memory. While cognitive flexibility and other aspects of executive functioning could not be assessed, I expect ongoing impairments in these aspects of thinking as well. Additional performance variability was exhibited across confrontation naming. Given validity and testing tolerance concerns, the underlying cause for his dementia presentation also cannot be stated with confidence. Carlos Short psychiatric history is quite extensive, with many periods of severe difficulties. While he reported mild depressive symptoms during the past 1-2 weeks, he did describe severe symptoms of anxiety across mood-related questionnaires. Significant cognitive dysfunction is certainly a reasonable finding given the extent of current and lifelong symptoms surrounding anxiety, depression, and bipolar I disorder. With that being said, I do have concerns surrounding an additional culprit for reported dysfunction. Recent neuroimaging suggested concerning age-advanced generalized brain atrophy, likely to the extent where a dementia presentation caused by a neurodegenerative illness could be reasonably assumed. Carlos Short stated his belief that he has Alzheimer's  disease during interview. I do feel that this is plausible and there is some reason for concern in this regard. Across memory testing, severe impairments were noted. He appeared to have no recollection of being previously exposed to information across learning trials and repeatedly made statements to support this (e.g., "What list?" or "What picture?") when asked about a previously learned list or words or drawn figure. He exhibited 0% retention across all memory tasks. While frustration prevented him from participating in recognition trials and obtaining an understanding of memory storage abilities, this frustration could certainly stem from a true inability to remember information. Taken together, this could suggest rapid forgetting and a prominent storage impairment, both of which are the hallmark characteristics of this illness. Further weaknesses surrounding semantic fluency, confrontation naming, and cognitive flexibility would follow typical disease progression. Overall, it could be that he best aligns with a mixed dementia presentation (i.e., Alzheimer's disease exacerbated by severe psychiatric distress). However, as stated above, this cannot be stated with certainty given testing tolerance and validity concerns.  Carlos Short was accompanied by his wife during the current feedback session. Content of the current session focused on the results of his neuropsychological evaluation. Carlos Short was given the opportunity to ask questions and his questions were answered. He was encouraged to reach out should additional questions arise. A copy of his report was provided at the conclusion of the visit.      Greater than 31 minutes were spent preparing for, conducting, and documenting the current feedback session with Carlos Short, billed as one unit [phone].

## 2022-05-29 NOTE — Telephone Encounter (Signed)
Abnormal TSH per Dr. Cristopher Peru;  Orders, redraw TSH in 3 Months.    Recall placed in Epic for 3 Month TSH Redraw. TSH Lab order placed for 3  Months; 08/2022

## 2022-05-30 ENCOUNTER — Encounter: Payer: Medicare Other | Admitting: Psychology

## 2022-06-07 ENCOUNTER — Encounter: Payer: Self-pay | Admitting: Physician Assistant

## 2022-06-07 ENCOUNTER — Ambulatory Visit: Payer: Medicare Other | Admitting: Physician Assistant

## 2022-06-07 VITALS — HR 61 | Resp 18 | Ht 71.0 in | Wt 172.0 lb

## 2022-06-07 DIAGNOSIS — R413 Other amnesia: Secondary | ICD-10-CM

## 2022-06-07 DIAGNOSIS — F319 Bipolar disorder, unspecified: Secondary | ICD-10-CM | POA: Diagnosis not present

## 2022-06-07 DIAGNOSIS — F03A18 Unspecified dementia, mild, with other behavioral disturbance: Secondary | ICD-10-CM | POA: Diagnosis not present

## 2022-06-07 DIAGNOSIS — F329 Major depressive disorder, single episode, unspecified: Secondary | ICD-10-CM

## 2022-06-07 NOTE — Patient Instructions (Addendum)
It was a pleasure to see you today at our office.   Recommendations:  Follow up Sept 30 at 11:30  Continue memantine 10 mg 2 times a day  Pet scan of the brain  Referral to psychiatry Major depression  and bipolar disorder  Recommend CBT in addition to Psychiatry   For assessment of decision of mental capacity and competency:  Call Dr. Anthoney Harada, geriatric psychiatrist at [phone] Counseling regarding caregiver distress, including caregiver depression, anxiety and issues regarding community resources, adult day care programs, adult living facilities, or memory care questions:  please contact your  Primary [place] Worker  For psychiatric meds, mood meds: Please have your primary care physician manage these medications.  If you have any severe symptoms of a stroke, or other severe issues such as confusion,severe chills or fever, etc call 911 or go to the ER as you may need to be evaluated further      RECOMMENDATIONS FOR ALL PATIENTS WITH MEMORY PROBLEMS: 1. Continue to exercise (Recommend 30 minutes of walking everyday, or 3 hours every week) 2. Increase social interactions - continue going to [place] and enjoy social gatherings with friends and family 3. Eat healthy, avoid fried foods and eat more fruits and vegetables 4. Maintain adequate blood pressure, blood sugar, and blood cholesterol level. Reducing the risk of stroke and cardiovascular disease also helps promoting better memory. 5. Avoid stressful situations. Live a simple life and avoid aggravations. Organize your time and prepare for the next day in anticipation. 6. Sleep well, avoid any interruptions of sleep and avoid any distractions in the bedroom that may interfere with adequate sleep quality 7. Avoid sugar, avoid sweets as there is a strong link between excessive sugar intake, diabetes, and cognitive impairment We discussed the Mediterranean diet, which has been shown to help patients reduce the risk of  progressive memory disorders and reduces cardiovascular risk. This includes eating fish, eat fruits and green leafy vegetables, nuts like almonds and hazelnuts, walnuts, and also use olive oil. Avoid fast foods and fried foods as much as possible. Avoid sweets and sugar as sugar use has been linked to worsening of memory function.  There is always a concern of gradual progression of memory problems. If this is the case, then we may need to adjust level of care according to patient needs. Support, both to the patient and caregiver, should then be put into place.    The Alzheimer[employer] Association is here all day, every day for people facing Alzheimer's disease through our free 24/7 Helpline: [phone]. The Helpline provides reliable information and support to all those who need assistance, such as individuals living with memory loss, Alzheimer's or other dementia, caregivers, health care professionals and the public.  Our highly trained and knowledgeable staff can help you with: Understanding memory loss, dementia and Alzheimer's  Medications and other treatment options  General information about aging and brain health  Skills to provide quality care and to find the best care from professionals  Legal, financial and living-arrangement decisions Our Helpline also features: Confidential care consultation provided by master's level clinicians who can help with decision-making support, crisis assistance and education on issues families face every day  Help in a caller's preferred language using our translation service that features more than 200 languages and dialects  Referrals to local community programs, services and ongoing support     FALL PRECAUTIONS: Be cautious when walking. Scan the area for obstacles that may increase the risk of trips and falls.  When getting up in the mornings, sit up at the edge of the bed for a few minutes before getting out of bed. Consider elevating the bed at the head  end to avoid drop of blood pressure when getting up. Walk always in a well-lit room (use night lights in the walls). Avoid area rugs or power cords from appliances in the middle of the walkways. Use a walker or a cane if necessary and consider physical therapy for balance exercise. Get your eyesight checked regularly.  FINANCIAL OVERSIGHT: Supervision, especially oversight when making financial decisions or transactions is also recommended.  HOME SAFETY: Consider the safety of the kitchen when operating appliances like stoves, microwave oven, and blender. Consider having supervision and share cooking responsibilities until no longer able to participate in those. Accidents with firearms and other hazards in the house should be identified and addressed as well.   ABILITY TO BE LEFT ALONE: If patient is unable to contact 911 operator, consider using LifeLine, or when the need is there, arrange for someone to stay with patients. Smoking is a fire hazard, consider supervision or cessation. Risk of wandering should be assessed by caregiver and if detected at any point, supervision and safe proof recommendations should be instituted.  MEDICATION SUPERVISION: Inability to self-administer medication needs to be constantly addressed. Implement a mechanism to ensure safe administration of the medications.        Mediterranean Diet A Mediterranean diet refers to food and lifestyle choices that are based on the traditions of countries located on the [employer]. This way of eating has been shown to help prevent certain conditions and improve outcomes for people who have chronic diseases, like kidney disease and heart disease. What are tips for following this plan? Lifestyle  Cook and eat meals together with your family, when possible. Drink enough fluid to keep your urine clear or pale yellow. Be physically active every day. This includes: Aerobic exercise like running or swimming. Leisure activities  like gardening, walking, or housework. Get 7-8 hours of sleep each night. If recommended by your health care provider, drink red wine in moderation. This means 1 glass a day for nonpregnant women and 2 glasses a day for men. A glass of wine equals 5 oz (150 mL). Reading food labels  Check the serving size of packaged foods. For foods such as rice and pasta, the serving size refers to the amount of cooked product, not dry. Check the total fat in packaged foods. Avoid foods that have saturated fat or trans fats. Check the ingredients list for added sugars, such as corn syrup. Shopping  At the grocery store, buy most of your food from the areas near the walls of the store. This includes: Fresh fruits and vegetables (produce). Grains, beans, nuts, and seeds. Some of these may be available in unpackaged forms or large amounts (in bulk). Fresh seafood. Poultry and eggs. Low-fat dairy products. Buy whole ingredients instead of prepackaged foods. Buy fresh fruits and vegetables in-season from local farmers markets. Buy frozen fruits and vegetables in resealable bags. If you do not have access to quality fresh seafood, buy precooked frozen shrimp or canned fish, such as tuna, salmon, or sardines. Buy small amounts of raw or cooked vegetables, salads, or olives from the deli or salad bar at your store. Stock your pantry so you always have certain foods on hand, such as olive oil, canned tuna, canned tomatoes, rice, pasta, and beans. Cooking  Cook foods with extra-virgin olive oil instead of  using butter or other vegetable oils. Have meat as a side dish, and have vegetables or grains as your main dish. This means having meat in small portions or adding small amounts of meat to foods like pasta or stew. Use beans or vegetables instead of meat in common dishes like chili or lasagna. Experiment with different cooking methods. Try roasting or broiling vegetables instead of steaming or sauteing them. Add  frozen vegetables to soups, stews, pasta, or rice. Add nuts or seeds for added healthy fat at each meal. You can add these to yogurt, salads, or vegetable dishes. Marinate fish or vegetables using olive oil, lemon juice, garlic, and fresh herbs. Meal planning  Plan to eat 1 vegetarian meal one day each week. Try to work up to 2 vegetarian meals, if possible. Eat seafood 2 or more times a week. Have healthy snacks readily available, such as: Vegetable sticks with hummus. Greek yogurt. Fruit and nut trail mix. Eat balanced meals throughout the week. This includes: Fruit: 2-3 servings a day Vegetables: 4-5 servings a day Low-fat dairy: 2 servings a day Fish, poultry, or lean meat: 1 serving a day Beans and legumes: 2 or more servings a week Nuts and seeds: 1-2 servings a day Whole grains: 6-8 servings a day Extra-virgin olive oil: 3-4 servings a day Limit red meat and sweets to only a few servings a month What are my food choices? Mediterranean diet Recommended Grains: Whole-grain pasta. Brown rice. Bulgar wheat. Polenta. Couscous. Whole-wheat bread. [place]. Vegetables: Artichokes. Beets. Broccoli. Cabbage. Carrots. Eggplant. Green beans. Chard. Kale. Spinach. Onions. Leeks. Peas. Squash. Tomatoes. Peppers. Radishes. Fruits: Apples. Apricots. Avocado. Berries. Bananas. Cherries. Dates. Figs. Grapes. Lemons. Melon. Oranges. Peaches. Plums. Pomegranate. Meats and other protein foods: Beans. Almonds. Sunflower seeds. Pine nuts. Peanuts. [place]. Salmon. Scallops. Shrimp. [place]. Tilapia. Clams. Oysters. Eggs. Dairy: Low-fat milk. Cheese. Greek yogurt. Beverages: Water. Red wine. Herbal tea. Fats and oils: Extra virgin olive oil. Avocado oil. Grape seed oil. Sweets and desserts: [place] yogurt with honey. Baked apples. Poached pears. Trail mix. Seasoning and other foods: Basil. Cilantro. Coriander. Cumin. Mint. Parsley. Sage. Rosemary. Tarragon. Garlic. Oregano. Thyme. Pepper. Balsalmic  vinegar. Tahini. Hummus. Tomato sauce. Olives. Mushrooms. Limit these Grains: Prepackaged pasta or rice dishes. Prepackaged cereal with added sugar. Vegetables: Deep fried potatoes (french fries). Fruits: Fruit canned in syrup. Meats and other protein foods: Beef. Pork. Lamb. Poultry with skin. Hot dogs. Berniece Salines. Dairy: Ice cream. Sour cream. Whole milk. Beverages: Juice. Sugar-sweetened soft drinks. Beer. Liquor and spirits. Fats and oils: Butter. Canola oil. Vegetable oil. Beef fat (tallow). Lard. Sweets and desserts: Cookies. Cakes. Pies. Candy. Seasoning and other foods: Mayonnaise. Premade sauces and marinades. The items listed may not be a complete list. Talk with your dietitian about what dietary choices are right for you. Summary The Mediterranean diet includes both food and lifestyle choices. Eat a variety of fresh fruits and vegetables, beans, nuts, seeds, and whole grains. Limit the amount of red meat and sweets that you eat. Talk with your health care provider about whether it is safe for you to drink red wine in moderation. This means 1 glass a day for nonpregnant women and 2 glasses a day for men. A glass of wine equals 5 oz (150 mL). This information is not intended to replace advice given to you by your health care provider. Make sure you discuss any questions you have with your health care provider. Document Released: 10/21/2015 Document Revised: 11/23/2015 Document Reviewed: 10/21/2015 Elsevier Interactive Patient  Education  2017 [employer]     

## 2022-06-07 NOTE — Progress Notes (Signed)
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  Carlos Short. is a very pleasant 72 y.o. RH male  lifelong artist, with a history of hypertension, hyperlipidemia, prior history of AAA, history of PAF, CHF, PACs, bradycardia, BPH, chronic low back pain, history of situational mixed anxiety depressive disorder, bipolar 1 disorder and a diagnosis of mild dementia with behavioral disturbance per Neuropsych evaluation seen today in follow up for memory loss. Patient is currently on memantine 10 mg twice daily.  Prior MRI brain personally reviewed was remarkable for advanced cerebral volume loss without lobar predominance, mild chronic small vessel ischemic disease.  Due to unclear etiology although the diagnosis of dementia has been obtained, neuropsychology recommended that the patient have a PET scan, to determine if there is any evidence of Alzheimer's disease.  Patient is agreeable to it.  Patient declines lumbar puncture due to concerns of side effects of it including headaches.  In addition, we discussed referral to psychiatry, since he has not been seen by a specialist major depression and bipolar 1 disorder in a long time.  Would recommend psychiatry along with counseling for mood control and medications, as mood may interfere with his cognitive status..      Follow up in 6  months. Continue memantine 10 mg twice daily, side effects were discussed Continue to follow thyroid disease as per PCP FDG PET scan to further evaluate for Alzheimer's disease  Continue to control mood as per PCP Continue to replenish vitamin B12 Continue to control cardiovascular risk factors.    Subjective:    This patient is accompanied in the office by his wife who supplements the history.  Previous records as well as any outside records available were reviewed prior to todays visit. Patient was last seen on 10/24/2021.  Last MMSE was 22/30 on 09/22/2021.    Any  changes in memory since last visit?  Continues to have issues with short-term memory, long-term memory is normal.  When he stressed out memory is worse according to him.  He has "Good and bad days" Sometimes he goes from being active to having temper tantrums and fear" repeats oneself?  Endorsed "a little more than before" Disoriented when walking into a room?  Patient denies   Leaving objects in unusual places?  He admits to misplacing things, but not in unusual places. He admits "it is a nightmare"  Wandering behavior?  denies   Any personality changes since last visit?  When serious anxiety, he takes THC to help him calm out, Not being able to focus it affects the mood.   Any worsening depression?:  He has a history of depression, and trying to deal with several family event which affects seen emotionally. Hallucinations or paranoia?  Endorsed, he sees things moving, lightening, colors and shapes changes in objects, as well as seeing people around him.  "They are there but they are not paying attention to me ". Seizures?    denies    Any sleep changes? "It rotates" Denies vivid dreams over the last few years , REM behavior or sleepwalking   Sleep apnea?   denies   Any hygiene concerns?    denies   Independent of bathing and dressing?  "I have a problem I don't shower, and I know this is a symptom because I read these for the last 2 years and isolating YouTube" Does the patient needs help with medications? Wife is in charge   Who is in charge of  the finances?  Wife is in charge     Any changes in appetite?  denies     Patient have trouble swallowing?  denies   Does the patient cook?  Any kitchen accidents such as leaving the stove on? Patient denies   Any headaches?   denies   Chronic back pain  denies   Ambulates with difficulty?     denies   Recent falls or head injuries? denies     Unilateral weakness, numbness or tingling?    denies   Any tremors?  denies   Any anosmia?  Patient denies    Any incontinence of urine?  He has a history of kidney stones sees a urologist, self caths 3 times a day Any bowel dysfunction?     denies      Patient lives  with his wife Does the patient drive? Only short distances   Neuropsych evaluation 05/29/2022 If taken at face value (caution was advised), [name] pattern of performance is suggestive of severe impairment surrounding semantic fluency and all aspects of learning and memory. While cognitive flexibility and other aspects of executive functioning could not be assessed, I expect ongoing impairments in these aspects of thinking as well. Additional performance variability was exhibited across confrontation naming. Given validity and testing tolerance concerns, the underlying cause for his dementia presentation also cannot be stated with confidence. [name] psychiatric history is quite extensive, with many periods of severe difficulties. While he reported mild depressive symptoms during the past 1-2 weeks, he did describe severe symptoms of anxiety across mood-related questionnaires. Significant cognitive dysfunction is certainly a reasonable finding given the extent of current and lifelong symptoms surrounding anxiety, depression, and bipolar I disorder. With that being said, I do have concerns surrounding an additional culprit for reported dysfunction. Recent neuroimaging suggested concerning age-advanced generalized brain atrophy, likely to the extent where a dementia presentation caused by a neurodegenerative illness could be reasonably assumed. [name] stated his belief that he has Alzheimer's disease during interview. I do feel that this is plausible and there is some reason for concern in this regard. Across memory testing, severe impairments were noted. He appeared to have no recollection of being previously exposed to information across learning trials and repeatedly made statements to support this (e.g., "What list?" or "What  picture?") when asked about a previously learned list or words or drawn figure. He exhibited 0% retention across all memory tasks. While frustration prevented him from participating in recognition trials and obtaining an understanding of memory storage abilities, this frustration could certainly stem from a true inability to remember information. Taken together, this could suggest rapid forgetting and a prominent storage impairment, both of which are the hallmark characteristics of this illness. Further weaknesses surrounding semantic fluency, confrontation naming, and cognitive flexibility would follow typical disease progression. Overall, it could be that he best aligns with a mixed dementia presentation (i.e., Alzheimer's disease exacerbated by severe psychiatric distress). However, as stated above, this cannot be stated with certainty given testing tolerance and validity concerns     How long did patient have memory difficulties?  Patient attributes his memory issues to age, "this has been going on for the last 15 years, worse over the last 3 years since COVID ".  Mostly, he has difficulty with short-term memory, long-term memory is normal.   Patient lives with: Spouse   repeats oneself? Endorsed for the last 5 years, but over the last few weeks this is accentuated, he is wife  reports that he has the same questions every day for example "where are the AA batteries ", when she had already told him where they were. Disoriented when walking into a room?  "Probably , weeks ago, he was standing by the counter leaning down, did not respond, he said I didn't know where I was ".he also feels that he has lost some energy, and she is unsure if this is related to that or 2 through disorientation.  For example, they were walking in downtown [place], and they part regarding a side street, she went to the car, and he kept walking without being attentive, "spaced out " Leaving objects in unusual places?  Endorsed,  he reports that he cannot find his own stuff at home "I lost my 9-5, routine. I lost a ability to create, especially since people do not have the ability to buy art anymore " Ambulates  with difficulty?   Patient denies   Recent falls?  Patient denies   Any head injuries?  34 y old "I had a cracked skull " History of seizures?   Patient denies   Wandering behavior?  Patient denies   Patient drives?   Denies any issues , short distances  Any mood changes such irritability agitation?  "I am out of steam, no one cares about my art anymore " Any history of depression?:  Patient denies   Hallucinations? Endorsed.  He sees lighting, things moving, colors and shapes change in objects.  Sensing and seeing people around him, "they are friendly but not paying attention to me ". Paranoia?  Endorsed for the last 3 years "I made mistakes in whom to trust, especially at work, I am not liked anymore ".  He accused his son of taking his gun, which he did not.  Patient reports that he sleeps + REM behavior, denies sleepwalking   Patient reports vivid dreams they are "okay " History of sleep apnea?  Patient denies   Any hygiene concerns?  Endorsed .   Independent of bathing and dressing?  Endorsed he likes to change close about 5 times a day (this is a part of the artistic world he says "   Does the patient needs help with medications?  For the last year, his wife is in charge  Who is in charge of the finances? Wife in charge   Any changes in appetite?  Patient denies   Patient have trouble swallowing? Patient denies   Does the patient cook?  Patient denies   Any kitchen accidents such as leaving the stove on? Patient denies   Any headaches?  Patient denies   Double vision? Patient denies   Any focal numbness or tingling?  Patient denies   Chronic back pain Patient denies   Unilateral weakness?  Patient denies   Any tremors?  Patient denies   Any history of anosmia?  Patient denies   Any incontinence of  urine?  Patient has a history of nephrolithiasis, and sees a urologist.  He had "3 ER trips from October until February, and now he self caths 3 times a day "-wife says.   Any bowel dysfunction?  Periodically, he has constipation. History of heavy alcohol intake?  Patient denies   History of heavy tobacco use?  Patient denies   Family history of dementia?  Patient has strong family history of dementia of unknown type.     History of Present illness: Jaiveon Sanangelo. is a 72 y.o. 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   The patient has reportedly been declining cognitively over the last year, with impairments in short-term memory, depressed and labile affect, and at times becoming verbally aggressive with partner and chasing kids around the neighborhood with his car. He has been voicing passive SI for  much of his life, with no intent to act on "blowing his brains out" with a gun. However, his partner and friend are concerned that he has become more impulsive and that he could potentially follow through on such a threat.    In the last few months, he has become increasingly hopeless about his life and decline in function in the setting of recurrent UTIs c/b by kidney stones and urosepsis. He has noticed that he has mostly pleasant hallucinations of people and animals around him going about their daily stuff. Partner reports that only once did he hear voices that were scary to him. 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 Partner also reports that he repeatedly asks her the same questions and becomes episodically confused and highly irritable for no clear reasons.    She is overwhelmed physically, emotionally, and financially taking care of him at home and has been trying to get him referred to a neurologist by his PCP. However, she states the PCP has appeared not to have documented any indications for cognitive testing despite indicating that he would two weeks ago.    Today, the patient presents with a reactive depressed mood and insomnia. He endorses prior history of VH, denies AH. Declines SI/HI.    PREVIOUS MEDICATIONS:   CURRENT MEDICATIONS:  Outpatient Encounter Medications as of 06/07/2022  Medication Sig   amiodarone (PACERONE) 200 MG tablet Take 1 tablet (200 mg total) by mouth daily. Monday - Friday only   apixaban (ELIQUIS) 5 MG TABS tablet Take 1 tablet (5 mg total) by mouth 2 (two) times daily.   doxazosin (CARDURA) 8 MG tablet Take 1 tablet (8 mg total) by mouth daily.   fluticasone (FLONASE) 50 MCG/ACT nasal spray One or 2 sprays into each nostril daily for allergy control   ibandronate (BONIVA) 150 MG tablet Take 150 mg by mouth every 30 (thirty) days. 3rd of each month   memantine (NAMENDA) 5 MG tablet TAKE 1 TABLET (5 MG AT NIGHT) FOR 2 WEEKS, THEN INCREASE TO 1 TABLET (5 MG) TWICE A DAY   ondansetron (ZOFRAN) 4 MG tablet Take 1 tablet (4 mg total) by mouth every 8 (eight) hours as needed for nausea or vomiting.   OVER THE COUNTER MEDICATION 2 (two) times daily. Mens over 50 vitamin pack   rOPINIRole (REQUIP) 2 MG tablet TAKE ONE TABLET DURING THE DAY AND 2 TABLETS A NIGHT AS NEEDED FOR RESTLESS LEGS.   traMADol (ULTRAM) 50 MG tablet Take 1 tablet (50 mg total) by mouth every 12 (twelve) hours as needed (pain).   traZODone (DESYREL) 100 MG tablet TAKE 0.5 TABLETS (50 MG TOTAL) BY MOUTH AT BEDTIME.   No facility-administered encounter medications on file  as of  06/07/2022.       09/25/2021   10:00 AM  MMSE - Mini Mental State Exam  Orientation to time 3  Orientation to Place 3  Registration 3  Attention/ Calculation 2  Recall 3  Language- name 2 objects 2  Language- repeat 1  Language- follow 3 step command 2  Language- read & follow direction 1  Write a sentence 1  Copy design 1  Total score 22       No data to display          Objective:     PHYSICAL EXAMINATION:    VITALS:   Vitals:   06/07/22 1053  Pulse: 61  Resp: 18  SpO2: 99%  Weight: 172 lb (78 kg)  Height: 5\' 11"  (1.803 m)    GEN:  The patient appears stated age and is in NAD. HEENT:  Normocephalic, atraumatic.   Neurological examination:  General: NAD, well-groomed, appears stated age. Orientation: The patient is alert. Oriented to person, place and date Cranial nerves: There is good facial symmetry.anxious appearing, changes mood easily.  The speech is fluent and clear. No aphasia or dysarthria. Fund of knowledge is appropriate. Recent and remote memory are impaired. Attention and concentration are reduced.  Able to name objects and repeat phrases.  Hearing is intact to conversational tone.  Sensation: Sensation is intact to light touch throughout Motor: Strength is at least antigravity x4. DTR's 2/4 in UE/LE     Movement examination: Tone: There is normal tone in the UE/LE Abnormal movements:  no tremor.  No myoclonus.  No asterixis.   Coordination:  There is no decremation with RAM's. Normal finger to nose  Gait and Station: The patient has no difficulty arising out of a deep-seated chair without the use of the hands. The patient's stride length is good.  Gait is cautious and narrow.    Thank you for allowing [place] the opportunity to participate in the care of this nice patient. Please do not hesitate to contact us for any questions or concerns.   Total time spent on today's visit was 60 minutes dedicated to this patient today, preparing to see  patient, examining the patient, ordering tests and/or medications and counseling the patient, documenting clinical information in the EHR or other health record, independently interpreting results and communicating results to the patient/family, discussing treatment and goals, answering patient's questions and coordinating care.  Cc:  Medina-Vargas, Senaida Lange, NP  Sharene Butters 06/07/2022 12:12 PM

## 2022-06-29 ENCOUNTER — Ambulatory Visit (HOSPITAL_COMMUNITY): Payer: Medicare Other

## 2022-07-03 ENCOUNTER — Ambulatory Visit: Payer: Medicare Other | Admitting: Adult Health

## 2022-07-06 ENCOUNTER — Ambulatory Visit (INDEPENDENT_AMBULATORY_CARE_PROVIDER_SITE_OTHER): Payer: Medicare Other | Admitting: Adult Health

## 2022-07-06 ENCOUNTER — Encounter: Payer: Self-pay | Admitting: Adult Health

## 2022-07-06 VITALS — BP 125/88 | HR 61 | Temp 98.2°F | Resp 18 | Ht 71.0 in | Wt 163.1 lb

## 2022-07-06 DIAGNOSIS — B351 Tinea unguium: Secondary | ICD-10-CM

## 2022-07-06 DIAGNOSIS — I480 Paroxysmal atrial fibrillation: Secondary | ICD-10-CM

## 2022-07-06 DIAGNOSIS — F5101 Primary insomnia: Secondary | ICD-10-CM | POA: Diagnosis not present

## 2022-07-06 DIAGNOSIS — G8929 Other chronic pain: Secondary | ICD-10-CM

## 2022-07-06 DIAGNOSIS — F01A0 Vascular dementia, mild, without behavioral disturbance, psychotic disturbance, mood disturbance, and anxiety: Secondary | ICD-10-CM

## 2022-07-06 DIAGNOSIS — M545 Low back pain, unspecified: Secondary | ICD-10-CM

## 2022-07-06 DIAGNOSIS — G2581 Restless legs syndrome: Secondary | ICD-10-CM

## 2022-07-06 DIAGNOSIS — N400 Enlarged prostate without lower urinary tract symptoms: Secondary | ICD-10-CM

## 2022-07-06 DIAGNOSIS — F319 Bipolar disorder, unspecified: Secondary | ICD-10-CM

## 2022-07-06 MED ORDER — TRAMADOL HCL 50 MG PO TABS: 50.0000 mg | ORAL_TABLET | Freq: Two times a day (BID) | ORAL | 0 refills | Status: DC | PRN

## 2022-07-06 MED ORDER — TRAZODONE HCL 100 MG PO TABS: 50.0000 mg | ORAL_TABLET | Freq: Every day | ORAL | 1 refills | Status: DC

## 2022-07-06 MED ORDER — ROPINIROLE HCL 2 MG PO TABS: 2.0000 mg | ORAL_TABLET | Freq: Every day | ORAL | 1 refills | Status: DC

## 2022-07-06 NOTE — Progress Notes (Signed)
[facility] clinic  Provider:  Kenard Gower DNP  Code Status:  Full Code  Goals of Care:     06/07/2022   10:56 AM  Advanced Directives  Does Patient Have a Medical Advance Directive? No  Would patient like information on creating a medical advance directive? No - Patient declined     Chief Complaint  Patient presents with   Acute Visit    Toenails causing foot pain    HPI: Patient is a 72 y.o. male seen today for toenail discoloration. He was accompanied today by his wife. He denies trauma to it. Bilateral big toenails are brown and thick.  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 He gets confused if doesn't sleep.  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 Takes Memantine                                                                                          RLS (restless legs syndrome) -  takes Ropinirole at daily at  bedtme  PAF (paroxysmal atrial fibrillation) (HCC) -  takes Eliquis and Amiodarone  Benign prostatic hyperplasia without lower urinary tract symptoms -  denies urinary retention, takes                                                 Bipolar 1 disorder (HCC) - not taking Sertraline    Past Medical History:  Diagnosis Date   Acute gastritis without hemorrhage 11/09/2015   Acute lower UTI 06/13/2017   Acute metabolic encephalopathy 06/13/2017   Aneurysm of infrarenal abdominal aorta 06/13/2017   Infrarenal abdominal aortic aneurysm first noted on MRI of lumbar spine on 06/13/2017 measuring maximum diameter of 4.2 cm. CTA abd/pelvis on 09/24/18 showed 4.2 x 4.2 cm.   Both times,  the radiologist recommended follow up with ultrasound in 1 year. Records in Care Everywhere F.W.   Aortic atherosclerosis    Benign nevus of skin 02/17/2019   Benign prostatic hyperplasia with weak urinary stream 04/13/2016   Bilateral inguinal hernia 07/23/2012   Bipolar 1 disorder    Bladder calculus 04/19/2021   CHF (congestive heart failure) 02/01/2021   a.) TTE 02/01/2021: EF 40%, mild LVH, mild LAE, midl PR, mod TR, G2DD.   Chronic anticoagulation 04/20/2021   Chronic insomnia 01/09/2018   Chronic low back pain without sciatica 04/11/2017   Chronic pain syndrome 04/13/2016   Chronic right hip pain 04/12/2018   Closed compression fracture of L1 vertebra 10/06/2016   Complicated urinary tract infection 04/01/2021   COPD (chronic obstructive pulmonary disease)    Dementia, unclear etiology 09/14/2021   Diverticulosis    Frequent PVCs    Generalized anxiety  disorder    HFrEF (heart failure with reduced ejection fraction) 04/19/2021   Hiatal hernia    HTN (hypertension) 04/19/2021   Hyperlipidemia    Hypertrophy of prostate with urinary obstruction and other lower urinary tract symptoms (LUTS)    Infrarenal abdominal aortic aneurysm (AAA) without rupture 09/24/2018   a.) CTA CAP 09/24/2018: 4.2 x 4.2 cm; b.) CT abd abd 05/29/2019: 4.0 cm; c.) aorta duplex 12/30/2020: 4.14 x 4.08 x 4.19 cm; d.) CT abd/pel 02/21/2021: 4.6 cm; e.) CT renal 04/01/2021: 4.3 cm; f.) CT renal 04/19/2021: 4.6 cm   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   in hospital with sepsis   Long term current use of amiodarone    Long term current use of anticoagulant    a.) apixaban   Major depressive disorder    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   Nephrolithiasis 04/19/2021   Neuropathy of right foot    a.) s/p hip surgery   Osteopenia of multiple sites 01/10/2018   Osteoporosis    a.) on oral bisphosphonate   PAC (premature atrial contraction) 08/05/2021   PAF (paroxysmal atrial fibrillation)    a.) holter  07/25/2020: NSR, PAF/flutter (14% burden) with longest lasting 45 minutes, freq PACs (24.6% burden); b.) holter 06/01/2021: NSR, episodes of NSVT, SVT (longest lasting 20 beats), freq PACs (29.3% burden), PVCs (2.6% burden), vent bi/trigeminy   RBBB (right bundle branch block)    Restless leg syndrome    a.) on ropinirole   Seborrhea 10/20/2018   Seborrheic keratoses 02/09/2019   Sepsis secondary to UTI 12/31/2020   Thrombocytopenia 12/31/2020   Urinary retention 04/19/2021   UTI (urinary tract infection) 04/19/2021   Wears glasses    Wears partial dentures    top and bottom partial    Past Surgical History:  Procedure Laterality Date   CYSTOSCOPY WITH LITHOLAPAXY N/A 05/03/2021   Procedure: CYSTOSCOPY WITH LITHOLAPAXY holmium laser;  Surgeon: Despina Arias, MD;  Location: [facility];  Service: Urology;  Laterality: N/A;   HIP ARTHROPLASTY Right 2005   HOLEP-LASER ENUCLEATION OF THE PROSTATE WITH MORCELLATION N/A 01/20/2022   Procedure: HOLEP-LASER ENUCLEATION OF THE PROSTATE WITH MORCELLATION;  Surgeon: Sondra Come, MD;  Location: [facility];  Service: Urology;  Laterality: N/A;   INGUINAL HERNIA REPAIR Bilateral 08/16/2012   Procedure: LAPAROSCOPIC BILATERAL INGUINAL HERNIA REPAIR;  Surgeon: Wilmon Arms. Corliss Skains, MD;  Location: [facility];  Service: General;  Laterality: Bilateral;   INSERTION OF MESH Bilateral 08/16/2012   Procedure: INSERTION OF MESH;  Surgeon: Wilmon Arms. Corliss Skains, MD;  Location: [facility];  Service: General;  Laterality: Bilateral;   MULTIPLE TOOTH EXTRACTIONS     TONSILLECTOMY     age 8   UPPER GI ENDOSCOPY      Allergies  Allergen Reactions   Gabapentin Other (See Comments)    crazy   Tylenol [Acetaminophen] Other (See Comments)    Hurts his kidneys    Outpatient Encounter Medications as of 07/06/2022  Medication Sig   amiodarone (PACERONE) 200 MG tablet Take 1 tablet (200 mg total) by mouth daily. Monday - Friday only    apixaban (ELIQUIS) 5 MG TABS tablet Take 1 tablet (5 mg total) by mouth 2 (two) times daily.   fluticasone (FLONASE) 50 MCG/ACT nasal spray One or 2 sprays into each nostril daily for allergy control   ibandronate (BONIVA) 150 MG tablet Take 150 mg by mouth every 30 (thirty) days. 3rd of each month   memantine (NAMENDA) 5 MG tablet TAKE  1 TABLET (5 MG AT NIGHT) FOR 2 WEEKS, THEN INCREASE TO 1 TABLET (5 MG) TWICE A DAY   ondansetron (ZOFRAN) 4 MG tablet Take 1 tablet (4 mg total) by mouth every 8 (eight) hours as needed for nausea or vomiting.   OVER THE COUNTER MEDICATION 2 (two) times daily. Mens over 50 vitamin pack   rOPINIRole (REQUIP) 2 MG tablet TAKE ONE TABLET DURING THE DAY AND 2 TABLETS A NIGHT AS NEEDED FOR RESTLESS LEGS.   traMADol (ULTRAM) 50 MG tablet Take 1 tablet (50 mg total) by mouth every 12 (twelve) hours as needed (pain).   traZODone (DESYREL) 100 MG tablet TAKE 0.5 TABLETS (50 MG TOTAL) BY MOUTH AT BEDTIME.   [DISCONTINUED] doxazosin (CARDURA) 8 MG tablet Take 1 tablet (8 mg total) by mouth daily.   No facility-administered encounter medications on file as of 07/06/2022.    Review of Systems:  Review of Systems  Constitutional:  Negative for activity change, appetite change and fever.  HENT:  Negative for sore throat.   Eyes: Negative.   Cardiovascular:  Negative for chest pain and leg swelling.  Gastrointestinal:  Negative for abdominal distention, diarrhea and vomiting.  Genitourinary:  Negative for dysuria, frequency and urgency.  Skin:  Negative for color change.  Neurological:  Negative for dizziness and headaches.  Psychiatric/Behavioral:  Positive for confusion and sleep disturbance. Negative for behavioral problems. The patient is not nervous/anxious.     Health Maintenance  Topic Date Due   Pneumonia Vaccine 4+ Years old (1 of 2 - PCV) Never done   Zoster Vaccines- Shingrix (1 of 2) Never done   COLONOSCOPY (Pts 45-83yrs Insurance coverage will need to be  confirmed)  Never done   COVID-19 Vaccine (3 - Pfizer risk series) 04/22/2020   INFLUENZA VACCINE  10/12/2022   Medicare Annual Wellness (AWV)  05/23/2023   DTaP/Tdap/Td (2 - Td or Tdap) 04/10/2024   Hepatitis C Screening  Completed   HPV VACCINES  Aged Out    Physical Exam: Vitals:   07/06/22 1100  BP: 125/88  Pulse: 61  Resp: 18  Temp: 98.2 F (36.8 C)  SpO2: 98%  Weight: 163 lb 2 oz (74 kg)  Height: 5\' 11"  (1.803 m)   Body mass index is 22.75 kg/m. Physical Exam Constitutional:      General: He is not in acute distress.    Appearance: Normal appearance.  HENT:     Head: Normocephalic and atraumatic.     Mouth/Throat:     Mouth: Mucous membranes are moist.  Eyes:     Conjunctiva/sclera: Conjunctivae normal.  Cardiovascular:     Rate and Rhythm: Normal rate and regular rhythm.     Pulses: Normal pulses.     Heart sounds: Normal heart sounds.  Pulmonary:     Effort: Pulmonary effort is normal.     Breath sounds: Normal breath sounds.  Abdominal:     General: Bowel sounds are normal.     Palpations: Abdomen is soft.  Musculoskeletal:        General: No swelling. Normal range of motion.     Cervical back: Normal range of motion.  Skin:    General: Skin is warm and dry.  Neurological:     Mental Status: He is alert and oriented to person, place, and time. Mental status is at baseline.  Psychiatric:        Mood and Affect: Mood normal.        Behavior: Behavior normal.  Labs reviewed: Basic Metabolic Panel: Recent Labs    09/22/21 1517 11/13/21 1710 11/29/21 0835 03/25/22 1814 05/25/22 1115  NA  --  136  --  135 143  K  --  3.4*  --  3.8 3.8  CL  --  106  --  104 106  CO2  --  20*  --  20* 23  GLUCOSE  --  141*  --  99 81  BUN  --  21  --  17 16  CREATININE  --  1.11  --  1.12 1.21  CALCIUM  --  8.6*  --  8.6* 9.0  TSH 0.14*  --  0.95  --  0.308*   Liver Function Tests: Recent Labs    08/18/21 1216 05/25/22 1115  AST 13* 17  ALT 12 12   ALKPHOS 44 75  BILITOT 0.5 0.4  PROT 6.2* 6.1  ALBUMIN 3.1* 4.0   No results for input(s): "LIPASE", "AMYLASE" in the last 8760 hours. No results for input(s): "AMMONIA" in the last 8760 hours. CBC: Recent Labs    08/18/21 1216 11/13/21 1710 03/25/22 1814  WBC 6.7 20.7* 10.5  NEUTROABS 4.2 17.6* 7.6  HGB 12.2* 13.7 13.3  HCT 36.8* 40.4 40.8  MCV 92.7 88.8 86.1  PLT 150 178 128*   Lipid Panel: Recent Labs    11/29/21 0835  CHOL 179  HDL 63  LDLCALC 101*  TRIG 64  CHOLHDL 2.8   Lab Results  Component Value Date   HGBA1C 5.5 11/29/2021    Procedures since last visit: No results found.  Assessment/Plan  1. Onychomycosis - Ambulatory referral to Podiatry  2. Primary insomnia - traZODone (DESYREL) 100 MG tablet; Take 0.5 tablets (50 mg total) by mouth at bedtime.  Dispense: 45 tablet; Refill: 1  3. Chronic midline low back pain without sciatica -  stable - traMADol (ULTRAM) 50 MG tablet; Take 1 tablet (50 mg total) by mouth every 12 (twelve) hours as needed (pain).  Dispense: 30 tablet; Refill: 0  4. 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 RLS (restless legs syndrome) - rOPINIRole (REQUIP) 2 MG tablet; Take 1 tablet (2 mg total) by mouth at bedtime.  Dispense: 90 tablet; Refill: 1  6. PAF (paroxysmal atrial fibrillation) (HCC) -  rate-controlled -  continue Eliquis for anticoagulation and amiodarone for rate-control  7. Benign prostatic hyperplasia without lower urinary tract symptoms -  stopped taking Cardura -  denies urinary retention  8. Bipolar 1 disorder (HCC) - Ambulatory referral to Psychiatry    Labs/tests ordered:   None  Next appt:  Visit date not found

## 2022-07-21 ENCOUNTER — Encounter (HOSPITAL_COMMUNITY): Payer: Self-pay | Admitting: Psychiatry

## 2022-07-21 ENCOUNTER — Telehealth: Payer: Self-pay | Admitting: Physician Assistant

## 2022-07-21 ENCOUNTER — Encounter (HOSPITAL_COMMUNITY): Payer: Medicare Other

## 2022-07-21 ENCOUNTER — Ambulatory Visit (HOSPITAL_COMMUNITY)
Admission: EM | Admit: 2022-07-21 | Discharge: 2022-07-21 | Disposition: A | Discharge status: Home / Self Care | Payer: Medicare Other | Attending: Psychiatry | Admitting: Psychiatry

## 2022-07-21 DIAGNOSIS — I480 Paroxysmal atrial fibrillation: Secondary | ICD-10-CM | POA: Diagnosis not present

## 2022-07-21 DIAGNOSIS — G8929 Other chronic pain: Secondary | ICD-10-CM | POA: Diagnosis not present

## 2022-07-21 DIAGNOSIS — Z8679 Personal history of other diseases of the circulatory system: Secondary | ICD-10-CM | POA: Diagnosis not present

## 2022-07-21 DIAGNOSIS — F03A3 Unspecified dementia, mild, with mood disturbance: Secondary | ICD-10-CM | POA: Insufficient documentation

## 2022-07-21 DIAGNOSIS — F4323 Adjustment disorder with mixed anxiety and depressed mood: Secondary | ICD-10-CM | POA: Diagnosis present

## 2022-07-21 DIAGNOSIS — F69 Unspecified disorder of adult personality and behavior: Secondary | ICD-10-CM

## 2022-07-21 DIAGNOSIS — G479 Sleep disorder, unspecified: Secondary | ICD-10-CM | POA: Diagnosis not present

## 2022-07-21 DIAGNOSIS — F03A2 Unspecified dementia, mild, with psychotic disturbance: Secondary | ICD-10-CM | POA: Insufficient documentation

## 2022-07-21 DIAGNOSIS — I509 Heart failure, unspecified: Secondary | ICD-10-CM | POA: Diagnosis not present

## 2022-07-21 DIAGNOSIS — I110 Hypertensive heart disease with heart failure: Secondary | ICD-10-CM | POA: Insufficient documentation

## 2022-07-21 DIAGNOSIS — N400 Enlarged prostate without lower urinary tract symptoms: Secondary | ICD-10-CM | POA: Insufficient documentation

## 2022-07-21 DIAGNOSIS — F319 Bipolar disorder, unspecified: Secondary | ICD-10-CM | POA: Insufficient documentation

## 2022-07-21 DIAGNOSIS — F03A18 Unspecified dementia, mild, with other behavioral disturbance: Secondary | ICD-10-CM | POA: Diagnosis not present

## 2022-07-21 DIAGNOSIS — E785 Hyperlipidemia, unspecified: Secondary | ICD-10-CM | POA: Diagnosis not present

## 2022-07-21 NOTE — Telephone Encounter (Signed)
New message    Wife calling the PET scan was cancel today due to incorrectly coding.

## 2022-07-21 NOTE — Discharge Instructions (Signed)
You are scheduled for a new patient appointment with [facility] on May 29 at 10:15AM. Intake paperwork will be emailed to you. Please complete them as soon as possible.  [facility] [address] [place], [place] [address] [phone]

## 2022-07-21 NOTE — Progress Notes (Signed)
   07/21/22 1216  [facility] Triage Screening (Walk-ins at [facility] only)  How Did You Hear About [place]? Self  What Is the Reason for Your Visit/Call Today? 72-year-old Carlos Short. "Carlos Short" present to the [facility] accompanied by wife Carlos Short) who's present during the assessment. Carlos Short report he's having health problems and it's affecting his mood. Last year between Oct 2022-Feb. 2023 had 3 ER and the 4th trip was to remove a bladder soon. The other 3 ER was UTI and some from of synopsis. Patient's wife report during this time his cognitive function started to decline. Patient is currently seen bycardiology. Patient's wife report mental concerns short-term memory, crying spell past few days 'pt report he feels so bad' due to knowing what is in the near future for him. Pt expressed when you know your time is coming to an end soon your outlook on life changes when you know things are not going get better. Pt. wife reported client is depressive symptoms lack of hope, feel worthless, lack of out look on life, negative thinking of what's to come, feel as if he's a burdon and severe energy drop. Pt. wife reports he's on a mood stabilizer prescribed by his primary doctor but couldn't think of the name. Denied suicidal/homicidal ideations and report auditory/visual hallucinations. Report he can see and hear people in the room with him. Report feel people standing behind him and the voices of neutral. Report smoke a little THC every now and then.  How Long Has This Been Causing You Problems? 1 wk - 1 month  Have You Recently Had Any Thoughts About Hurting Yourself? No  Are You Planning to Commit Suicide/Harm Yourself At This time? No  Have you Recently Had Thoughts About Hurting Someone Carlos Short? No  Are You Planning To Harm Someone At This Time? No  Are you currently experiencing any auditory, visual or other hallucinations? No  Have You Used Any Alcohol or Drugs in the Past 24 Hours? No  Do you have any current medical  co-morbidities that require immediate attention? No  Clinician description of patient physical appearance/behavior: Patient presents with a pleasant affect and is cooperative  What Do You Feel Would Help You the Most Today? Treatment for Depression or other mood problem  If access to [facility] was not available, would you have sought care in the Emergency Department? No  Determination of Need Routine (7 days)  Options For Referral Outpatient Therapy
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
## 2022-07-25 ENCOUNTER — Ambulatory Visit: Payer: Medicare Other | Admitting: Podiatry

## 2022-07-25 DIAGNOSIS — L602 Onychogryphosis: Secondary | ICD-10-CM | POA: Diagnosis not present

## 2022-07-25 DIAGNOSIS — B351 Tinea unguium: Secondary | ICD-10-CM | POA: Diagnosis not present

## 2022-07-25 DIAGNOSIS — M79675 Pain in left toe(s): Secondary | ICD-10-CM

## 2022-07-25 DIAGNOSIS — M79674 Pain in right toe(s): Secondary | ICD-10-CM | POA: Diagnosis not present

## 2022-07-25 NOTE — Patient Instructions (Signed)

## 2022-07-25 NOTE — Telephone Encounter (Signed)
The coding was correct, Huntley Dec did peer to peer denied

## 2022-07-25 NOTE — Progress Notes (Signed)
Subjective:  Patient ID: Carlos Fitz., male    DOB: [birth year],  MRN: [id number]  Carlos Fitz. presents to clinic today for:  Chief Complaint  Patient presents with   Nail Problem    Rm 23 Bilateral hallux nail thickness and discoloration x 4. Pt requesting both nails be removed.    Patient presents with his wife today.  He notes that the nails have been very thick and unable to be trimmed for a long time.  He is aware that there is fungus in the toenails.  He would like to know treatment options today.  But he notes that he is leaning toward requesting that they be removed today.  PCP is Medina-Vargas, Monina C, NP.  Allergies  Allergen Reactions   Gabapentin Other (See Comments)    crazy   Tylenol [Acetaminophen] Other (See Comments)    Hurts his kidneys   Review of Systems: Negative except as noted in the HPI.  Objective:  Carlos Maye. is a pleasant 72 y.o. male in NAD. AAO x 3.  Vascular Examination: Capillary refill time is 3-5 seconds to toes bilateral. Palpable pedal pulses b/l LE. Digital hair present b/l. No pedal edema b/l. Skin temperature gradient WNL b/l. No varicosities b/l. No cyanosis or clubbing noted b/l.   Dermatological Examination: The bilateral hallux nail is 5 to 8 mm thick, with gryphosis, lysis along the distal aspect but the nail is curling down in the front back onto the skin of the toe.  There is some discomfort with compression of the nail plate bilateral hallux.  No surrounding erythema is noted.  No drainage or malodor is noted.  The nail is brown and yellow with minimal subungual debris.  Neurological Examination: Protective sensation intact with Semmes-Weinstein 10 gram monofilament b/l LE. Vibratory sensation intact b/l LE.  Musculoskeletal Examination: Muscle strength 5/5 to all LE muscle groups b/l.      Latest Ref Rng & Units 11/29/2021    8:35 AM  Hemoglobin A1C  Hemoglobin-A1c <5.7 % of total Hgb 5.5     Assessment/Plan: 1. Onychogryphosis   2. Pain due to onychomycosis of toenails of both feet     Reviewed onychomycosis with the patient today.  He seemed a bit hesitant towards experiencing pain, so we discussed debridement of the nail today along with using a bur to thin the nail and then begin topical treatment with a topical antifungal.  He notes that in the past he has tried topical antifungals and they were ineffective so he does not want to go that route today.  He requested that the nails be removed.  Discussed the risks involved with this.  Informed patient that we are causing some trauma to the nailbed by removing the nail completely.  We will not be removing the nail permanently, as we will begin treating the nail bed for fungus as the new nail grows.  He was in agreement.  Discussed patient's condition today.  After obtaining patient consent, the bilateral great toe was anesthetized with a 50:50 mixture of 1% lidocaine plain and 0.5% bupivacaine plain for a total of 3cc's administered.  Upon confirmation of anesthesia, a freer elevator was utilized to free the entire nail from the nail bed.  The hallux nail bilateral was then avulsed proximal to the eponychium and removed in toto.  The area was inspected for any remaining spicules.     Antibiotic ointment and a DSD were applied to both great  toes, followed by a Coban dressing.  Patient tolerated the anesthetic and procedure well and will f/u in 2-3 weeks for recheck.  Patient given post-procedure instructions for Epsom salt soaks, antibiotic ointment and daily use of Bandaids until toe starts to dry / form eschar.   At his follow-up we will also discuss beginning antifungal treatment topically as the nail hallux nails begin to grow in.  Return in about 2 weeks (around 08/08/2022) for avulsion recheck.   Clerance Lav, DPM, FACFAS [facility]     [address] [place],  [place] [address]                Office [phone]  Fax [phone]

## 2022-07-26 ENCOUNTER — Ambulatory Visit (HOSPITAL_COMMUNITY)
Admission: EM | Admit: 2022-07-26 | Discharge: 2022-07-26 | Disposition: A | Discharge status: Home / Self Care | Payer: Medicare Other | Attending: Behavioral Health | Admitting: Behavioral Health

## 2022-07-26 ENCOUNTER — Encounter (HOSPITAL_COMMUNITY): Payer: Self-pay | Admitting: Behavioral Health

## 2022-07-26 DIAGNOSIS — F0393 Unspecified dementia, unspecified severity, with mood disturbance: Secondary | ICD-10-CM | POA: Diagnosis not present

## 2022-07-26 DIAGNOSIS — Z79899 Other long term (current) drug therapy: Secondary | ICD-10-CM | POA: Insufficient documentation

## 2022-07-26 DIAGNOSIS — F319 Bipolar disorder, unspecified: Secondary | ICD-10-CM | POA: Insufficient documentation

## 2022-07-26 DIAGNOSIS — F69 Unspecified disorder of adult personality and behavior: Secondary | ICD-10-CM | POA: Diagnosis present

## 2022-07-26 DIAGNOSIS — G479 Sleep disorder, unspecified: Secondary | ICD-10-CM | POA: Diagnosis present

## 2022-07-26 DIAGNOSIS — Z9151 Personal history of suicidal behavior: Secondary | ICD-10-CM | POA: Diagnosis not present

## 2022-07-26 DIAGNOSIS — F17290 Nicotine dependence, other tobacco product, uncomplicated: Secondary | ICD-10-CM | POA: Insufficient documentation

## 2022-07-26 DIAGNOSIS — F03918 Unspecified dementia, unspecified severity, with other behavioral disturbance: Secondary | ICD-10-CM | POA: Diagnosis present

## 2022-07-26 NOTE — Telephone Encounter (Signed)
Pt wife wants to speak with someone about the PET scan. She said that she has called before about this and has not gotten a call back. She states the hospital is telling her that it is a coding issue. She is very concern about patient behavior. She states that she has tried to get him into Behavior health. They will not admit him to behavior health, he has tried jumping out of a moving car.

## 2022-07-26 NOTE — Telephone Encounter (Signed)
Patient wife called back and states that she wanted to let our office know that the officers did come to the house. They did take Carlos Short with them she is not sure where they took as they did not tell her where they were taking him. She was told that she did not need to go with them or him.

## 2022-07-26 NOTE — Telephone Encounter (Signed)
911 called informed that pt is having suicidal thoughts this nurse gave them his address and his name and his wife's name. I told them that the patients wife told him that he guns in the house but the wife's has put them up, 911 stated that they have an officer in route and that the wife needs to call 911 they need more information. 911 also advised that he did have plans such has he had tried to jump out of a car last week and that he was think of way that would not cause a mess,

## 2022-07-26 NOTE — ED Provider Notes (Cosign Needed Addendum)
Behavioral Health Urgent Care Medical Screening Exam  Patient Name: Carlos Short. MRN: [id number] Date of Evaluation: 07/26/22 Chief Complaint:  "I haven't been sleeping" Diagnosis:  Final diagnoses:  Sleep difficulties  Behavior concern in adult   History of Present Illness: Carlos Short. is a 72 y.o. male patient with a past psychiatric history of dementia, chronic insomnia, bipolar 1 disorder, GAD, and MDD who presented to [facility] voluntarily and unaccompanied via [facility] for a walk-in assessment with complaints of difficulty sleeping.  Patient assessed face-to-face by this provider, consulted with Dr. Lucianne Muss, and chart reviewed on 07/26/22. On evaluation, Carlos Short. is seated in assessment area in no acute distress. Patient is alert and oriented x4, calm, cooperative, and pleasant. Speech is clear and coherent, normal rate and volume. Eye contact is good. Mood is euthymic with congruent affect. Thought process is coherent with logical thought content. Patient denies suicidal and homicidal ideations and easily contracts verbally for safety with this Clinical research associate. Patient reports a history of 1 suicide attempt in the 1970s. Patient denies a history of self-harm. Patient reports multiple past psychiatric hospitalizations. Per chart review, patient presented to [facility] on 07/21/22 with similar symptoms and was discharged with recommendations to follow-up with [facility] and attend his new patient at [facility] on 08/09/22 at 10:15am. Patient denies current auditory and visual hallucinations but states he sometimes "has images in my head of people and animals standing around, it comes and goes." Patient denies symptoms of paranoia. Patient is able to converse coherently with goal-directed thoughts and no distractibility or preoccupation. Objectively, there is no evidence of psychosis/mania, delusional thinking, or indication that patient is responding to internal or  external stimuli.  Patient endorses poor sleep, stating he has been unable to sleep the past 3 days due to working on artwork at home. 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 Patient reports good appetite. Patient lives at home with his wife in [place]. Patient denies access to weapons, stating his firearms have been locked away by his wife. Patient endorses daily marijuana use by smoking a pipe 1-3x/day. Patient denies use of alcohol or other illicit substances. Patient denies having currently outpatient psychiatric services in place for therapy or medication management but confirms he has an upcoming appointment at [facility]. Patient states he has been going to [facility] related to his dementia diagnosis and was supposed to have a PET scan done but that it keeps getting canceled.   Patient gave verbal consent for this provider to contact his wife Carlos Short [phone]) to obtain collateral information. Wife reports ongoing behavior concerns that began 2 years ago. 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 Wife states patient has difficulty completing sentences, becomes upset easily, has difficulty sleeping, has decreased appetite, frequently rearranges furniture in the house, "talks constantly, and can't keep up during conversations." Wife states after leaving [facility] on 07/21/22, patient became upset and got out of the car on the way home and started walking down [facility] Denton Lank states patient also recently became upset with his friend when the friend did not give him money that he requested to go shopping with, so patient got out of the car and walked 2 miles home. Wife denies that patient expressed suicidal thoughts today and states patient made passive suicidal statements to her 1 week ago. Wife states patient also has the  belief they will be "coming into a lot of money soon." Wife confirms that  patient does not have access to weapons. Wife states patient received cognitive testing at [facility] and "he had a rage there and was throwing things, they seen bipolar, they ordered PET scans but they have been canceled twice because of his insurance." Wife states patient was supposed to have a PET scan on Friday at [facility] but it was canceled. Wife states she tried to call [facility] to follow-up on Friday, Monday, and today. Wife states when she spoke with someone at [facility] today and told them about patient's ongoing behaviors, they called 911, the sheriff's office came to their home, and brought patient to [facility] for an evaluation. Wife states she manages patient's medications (memantine 5mg  BID, amiodarone 200mg  daily Monday-Friday only, Eliquis 5mg  BID, Requip 2mg  at bedtime, and Trazodone 50mg  at bedtime). Wife denies any current safety concerns and states she will come to [facility] at this time to pick patient up and take him home.   Patient and wife offered support and encouragement. Discussed with patient and wife following up with [facility] and attending new patient appointment at [facility] on 08/09/22 at 10:15am. Patient and wife are in agreement with plan of care.   At this time, [name]. is educated and verbalizes understanding of mental health resources and other crisis services in the community. He is instructed to call 911 and present to the nearest emergency room should he experience any suicidal/homicidal ideation, auditory/visual/hallucinations, or detrimental worsening of his mental health condition. Patient was also advised by writer that he could call the toll-free phone on back of  insurance card to assist with identifying in network services and agencies or the number on back of Medicaid card to speak with care coordinator.  Flowsheet Row ED from 07/21/2022 in [facility] ED from 03/25/2022 in [facility] Emergency  Department at [facility] Admission (Discharged) from 01/20/2022 in [facility]  C-SSRS RISK CATEGORY No Risk No Risk No Risk       Psychiatric Specialty Exam  Presentation  General Appearance:Appropriate for Environment; Casual  Eye Contact:Good  Speech:Clear and Coherent; Normal Rate  Speech Volume:Normal  Handedness:Right   Mood and Affect  Mood: Euthymic  Affect: Congruent   Thought Process  Thought Processes: Coherent; Goal Directed  Descriptions of Associations:Intact  Orientation:Full (Time, Place and Person)  Thought Content:Logical    Hallucinations:None  Ideas of Reference:None  Suicidal Thoughts:No  Homicidal Thoughts:No   Sensorium  Memory: Immediate Fair; Recent Fair; Remote Fair  Judgment: Intact  Insight: Present   Executive Functions  Concentration: Fair  Attention Span: Fair  Recall: [employer] of Knowledge: Fair  Language: Fair   Psychomotor Activity  Psychomotor Activity: Normal   Assets  Assets: Manufacturing systems engineer; Desire for Improvement; Financial Resources/Insurance; Housing; Leisure Time; Resilience; Social Support; Transportation   Sleep  Sleep: Poor  Number of hours: No data recorded  Physical Exam: Physical Exam Vitals and nursing note reviewed.  Constitutional:      General: He is not in acute distress.    Appearance: Normal appearance. He is not ill-appearing.  HENT:     Head: Normocephalic and atraumatic.     Nose: Nose normal.  Eyes:     General:        Right eye: No discharge.        Left eye: No discharge.  Conjunctiva/sclera: Conjunctivae normal.  Cardiovascular:     Rate and Rhythm: Normal rate.  Pulmonary:     Effort: Pulmonary effort is normal. No respiratory distress.  Musculoskeletal:        General: Normal range of motion.     Cervical back: Normal range of motion.  Skin:    General: Skin is warm and dry.   Neurological:     General: No focal deficit present.     Mental Status: He is alert and oriented to person, place, and time. Mental status is at baseline.  Psychiatric:        Attention and Perception: Attention and perception normal.        Mood and Affect: Mood and affect normal.        Speech: Speech normal.        Behavior: Behavior normal. Behavior is cooperative.        Thought Content: Thought content normal. Thought content is not paranoid or delusional. Thought content does not include homicidal or suicidal ideation. Thought content does not include homicidal or suicidal plan.        Cognition and Memory: Cognition and memory normal.        Judgment: Judgment normal.    Review of Systems  Constitutional: Negative.   HENT: Negative.    Eyes: Negative.   Respiratory: Negative.    Cardiovascular: Negative.   Gastrointestinal: Negative.   Genitourinary: Negative.   Musculoskeletal: Negative.   Skin: Negative.   Neurological: Negative.   Endo/Heme/Allergies: Negative.   Psychiatric/Behavioral:  Negative for depression, hallucinations, memory loss, substance abuse and suicidal ideas. The patient has insomnia. The patient is not nervous/anxious.    Blood pressure (!) 119/98, pulse 68, temperature 98.3 F (36.8 C), temperature source Oral, resp. rate 18, SpO2 100 %. There is no height or weight on file to calculate BMI.  Musculoskeletal: Strength & Muscle Tone: within normal limits Gait & Station: normal Patient leans: N/A   [facility] MSE Discharge Disposition for Follow up and Recommendations: Based on my evaluation the patient does not appear to have an emergency medical condition and can be discharged with resources and follow up care in outpatient services for Medication Management and Individual Therapy  -Discharge home -Patient will follow up with [facility] and attend new patient appointment at [facility] on 08/09/22 at 10:15am  Sunday Corn,  NP 07/26/2022, 6:27 PM

## 2022-07-26 NOTE — Discharge Instructions (Addendum)
-  Please follow up with [facility]  -New patient appointment scheduled at [facility] for Aug 09, 2022 at 10:15AM.   Based on the information you have provided and the presenting issue, outpatient services with therapy and psychiatry have been recommended.  It is imperative that you follow through with treatment recommendations within 5-7 days from the of discharge to mitigate further risk to your safety and mental well-being. A list of referrals has been provided below to get you started.  You are not limited to the list provided.  In case of an urgent crisis, you may contact the Mobile Crisis Unit with [facility] at [phone].  Outpatient Therapy and Psychiatry for Medicare Recipients  [facility] [address] [place], [place], [address] [phone] phone  [facility] [address] [place], [place], [address] [address] phone ([address] [employer] [address] - [place], [address], [place], [place], [employer], [address], [employer] Healthy [place], [place], [address], [place], [place], [place], Optum, T[employer], [employer], [employer], [place])  [facility] [address] [place], [place], [address] [phone] phone  [facility] [address] [place], [place], [address] [phone] phone  [facility] [address] [place], [place], [address] [phone] phone [phone] fax  [facility], [place] [address], [place], [address] [phone] phone  [facility] [address] [place], [place], [address] [phone] phone [phone] fax  [facility] [address] [place], [place], [address] [phone] phone  [facility] Kussmaul [address] [place], [place], [address] [phone] phone  [facility] [address] [place], [place], [address] [phone] phone [phone] fax

## 2022-07-26 NOTE — Telephone Encounter (Signed)
I spoke with wife and advised to hang up and call 911, she is in need of help and patient is requesting help.

## 2022-07-27 NOTE — Telephone Encounter (Signed)
Will need to consider LP for AD diagnosis because this has been denied several times.

## 2022-07-27 NOTE — Telephone Encounter (Signed)
Pt wife called back and states that on the paperwork that came home with Carlos Short yesterday it state he has a sleep disorder and to move forward with the PET scan.   So she is wanting to speak to [facility] about getting that resch and how she goes about getting that scan, she states that [facility] decline the scan but the patient  needs it    Please call

## 2022-07-27 NOTE — Telephone Encounter (Signed)
Patient advised will call back on Monday to discuss final decision on LP

## 2022-08-01 ENCOUNTER — Telehealth: Payer: Self-pay | Admitting: Physician Assistant

## 2022-08-01 ENCOUNTER — Encounter: Payer: Self-pay | Admitting: Physician Assistant

## 2022-08-01 NOTE — Telephone Encounter (Signed)
This was already been closed out peer to peer was done, it was denied.

## 2022-08-01 NOTE — Telephone Encounter (Signed)
Kendra with [employer] would like a call back to get the status of authorization of PET scan

## 2022-08-03 ENCOUNTER — Encounter: Payer: Self-pay | Admitting: Adult Health

## 2022-08-03 NOTE — Telephone Encounter (Signed)
Providers response was copied and pasted to patient

## 2022-08-03 NOTE — Telephone Encounter (Signed)
It might not be of the same calibration but, pls monitor and docu document it at home and bring to next appointment. Pls continue giving him nutritional supplements like ensure or boost.  Avanell Shackleton

## 2022-08-09 ENCOUNTER — Encounter (HOSPITAL_COMMUNITY): Payer: Self-pay

## 2022-08-09 ENCOUNTER — Other Ambulatory Visit: Payer: Self-pay

## 2022-08-09 ENCOUNTER — Emergency Department (HOSPITAL_COMMUNITY)
Admission: EM | Admit: 2022-08-09 | Discharge: 2022-08-09 | Disposition: A | Discharge status: Home / Self Care | Payer: Medicare Other | Attending: Emergency Medicine | Admitting: Emergency Medicine

## 2022-08-09 DIAGNOSIS — Z7901 Long term (current) use of anticoagulants: Secondary | ICD-10-CM | POA: Insufficient documentation

## 2022-08-09 DIAGNOSIS — E876 Hypokalemia: Secondary | ICD-10-CM | POA: Diagnosis not present

## 2022-08-09 DIAGNOSIS — R197 Diarrhea, unspecified: Secondary | ICD-10-CM | POA: Insufficient documentation

## 2022-08-09 DIAGNOSIS — R001 Bradycardia, unspecified: Secondary | ICD-10-CM | POA: Insufficient documentation

## 2022-08-09 DIAGNOSIS — R112 Nausea with vomiting, unspecified: Secondary | ICD-10-CM | POA: Diagnosis present

## 2022-08-09 LAB — COMPREHENSIVE METABOLIC PANEL
ALT: 72 U/L — ABNORMAL HIGH (ref 0–44)
AST: 47 U/L — ABNORMAL HIGH (ref 15–41)
Albumin: 3.6 g/dL (ref 3.5–5.0)
Alkaline Phosphatase: 74 U/L (ref 38–126)
Anion gap: 10 (ref 5–15)
BUN: 15 mg/dL (ref 8–23)
CO2: 23 mmol/L (ref 22–32)
Calcium: 9 mg/dL (ref 8.9–10.3)
Chloride: 107 mmol/L (ref 98–111)
Creatinine, Ser: 1.07 mg/dL (ref 0.61–1.24)
GFR, Estimated: >60 mL/min (ref >60)
Glucose, Bld: 112 mg/dL — ABNORMAL HIGH (ref 70–99)
Potassium: 3.3 mmol/L — ABNORMAL LOW (ref 3.5–5.1)
Sodium: 140 mmol/L (ref 135–145)
Total Bilirubin: 1.2 mg/dL (ref 0.3–1.2)
Total Protein: 7.3 g/dL (ref 6.5–8.1)

## 2022-08-09 LAB — CBC WITH DIFFERENTIAL/PLATELET
Abs Immature Granulocytes: 0.04 10*3/uL (ref 0.00–0.07)
Basophils Absolute: 0 10*3/uL (ref 0.0–0.1)
Basophils Relative: 0 %
Eosinophils Absolute: 0 10*3/uL (ref 0.0–0.5)
Eosinophils Relative: 0 %
HCT: 42.9 % (ref 39.0–52.0)
Hemoglobin: 14.3 g/dL (ref 13.0–17.0)
Immature Granulocytes: 1 %
Lymphocytes Relative: 11 %
Lymphs Abs: 1 10*3/uL (ref 0.7–4.0)
MCH: 29.5 pg (ref 26.0–34.0)
MCHC: 33.3 g/dL (ref 30.0–36.0)
MCV: 88.5 fL (ref 80.0–100.0)
Monocytes Absolute: 0.3 10*3/uL (ref 0.1–1.0)
Monocytes Relative: 4 %
Neutro Abs: 7.3 10*3/uL (ref 1.7–7.7)
Neutrophils Relative %: 84 %
Platelets: 176 10*3/uL (ref 150–400)
RBC: 4.85 MIL/uL (ref 4.22–5.81)
RDW: 16 % — ABNORMAL HIGH (ref 11.5–15.5)
WBC: 8.6 10*3/uL (ref 4.0–10.5)
nRBC: 0 % (ref 0.0–0.2)

## 2022-08-09 LAB — LIPASE, BLOOD: Lipase: 45 U/L (ref 11–51)

## 2022-08-09 MED ORDER — ONDANSETRON HCL 4 MG/2ML IJ SOLN
4.0000 mg | Freq: Once | INTRAMUSCULAR | Status: AC
  Administered 2022-08-09: 4 mg via INTRAVENOUS
  Filled 2022-08-09: qty 2

## 2022-08-09 MED ORDER — POTASSIUM CHLORIDE CRYS ER 20 MEQ PO TBCR
40.0000 meq | EXTENDED_RELEASE_TABLET | Freq: Once | ORAL | Status: AC
  Administered 2022-08-09: 40 meq via ORAL
  Filled 2022-08-09: qty 2

## 2022-08-09 MED ORDER — ONDANSETRON HCL 4 MG PO TABS: 4.0000 mg | ORAL_TABLET | Freq: Four times a day (QID) | ORAL | 0 refills | Status: DC

## 2022-08-09 MED ORDER — SODIUM CHLORIDE 0.9 % IV BOLUS
1000.0000 mL | Freq: Once | INTRAVENOUS | Status: AC
  Administered 2022-08-09: 1000 mL via INTRAVENOUS

## 2022-08-09 NOTE — ED Notes (Signed)
This Rn walked in triage room and introduced self and asked what brought [place] to ER today pt proceeds to say "I am dying" explained to patient that he is breathing and talking and that is good." Patient interprets nurse and yells "look you fucking wise smart ass." Explained to patient there was no need to cuss or yell and to please describe what symptoms he is having patient proceeds to yell "I need to laying fucking horizontal" once again explained to patient that we can't help until we have a triage done so we know what we need to treat and order.

## 2022-08-09 NOTE — ED Notes (Signed)
Patient standing at door way and asking how much longer. Explained to patient I can't really give out how much longer but I can keep him updated. Patient begins to [facility]

## 2022-08-09 NOTE — ED Provider Notes (Signed)
[facility] Provider Note   CSN: [id number] Arrival date & time: 08/09/22  1008     History  Chief Complaint  Patient presents with   Emesis   Diarrhea    [name] is a 72 y.o. male.  72 year old male with prior medical history as detailed below presents for evaluation.  Patient reports acute onset of nausea, vomiting, diarrhea roughly 1 hour prior to evaluation.  Patient reports that his symptoms began he decided to come to the ED given how he is feeling.  He denies abdominal pain.  He denies fever.  Nursing staff report that the patient was difficult in triage.  The history is provided by the patient and medical records.       Home Medications Prior to Admission medications   Medication Sig Start Date End Date Taking? Authorizing Provider  amiodarone (PACERONE) 200 MG tablet Take 1 tablet (200 mg total) by mouth daily. Monday - Friday only 11/23/21  Yes Tillery, Mariam Dollar, PA-C  apixaban (ELIQUIS) 5 MG TABS tablet Take 1 tablet (5 mg total) by mouth 2 (two) times daily. 02/27/22  Yes Medina-Vargas, Monina C, NP  ibandronate (BONIVA) 150 MG tablet Take 150 mg by mouth every 30 (thirty) days. 3rd of each month 11/05/20  Yes [provider]  memantine (NAMENDA) 5 MG tablet TAKE 1 TABLET (5 MG AT NIGHT) FOR 2 WEEKS, THEN INCREASE TO 1 TABLET (5 MG) TWICE A DAY Patient taking differently: Take 5 mg by mouth every 14 (fourteen) days. 11/16/21  Yes Marcos Eke, PA-C  rOPINIRole (REQUIP) 2 MG tablet Take 1 tablet (2 mg total) by mouth at bedtime. 07/06/22  Yes Medina-Vargas, Monina C, NP  traZODone (DESYREL) 100 MG tablet Take 0.5 tablets (50 mg total) by mouth at bedtime. 07/06/22  Yes Medina-Vargas, Monina C, NP  ondansetron (ZOFRAN) 4 MG tablet Take 1 tablet (4 mg total) by mouth every 8 (eight) hours as needed for nausea or vomiting. Patient not taking: Reported on 08/09/2022 03/24/22   Medina-Vargas, Monina  C, NP  traMADol (ULTRAM) 50 MG tablet Take 1 tablet (50 mg total) by mouth every 12 (twelve) hours as needed (pain). 07/06/22   Medina-Vargas, Monina C, NP      Allergies    Gabapentin and Tylenol [acetaminophen]    Review of Systems   Review of Systems  All other systems reviewed and are negative.   Physical Exam Updated Vital Signs BP (!) 144/86 (BP Location: Left Arm)   Pulse (!) 57   Temp 98 F (36.7 C) (Oral)   Resp 18   Wt 63 kg   SpO2 100%   BMI 22.42 kg/m  Physical Exam Vitals and nursing note reviewed.  Constitutional:      General: He is not in acute distress.    Appearance: Normal appearance. He is well-developed.  HENT:     Head: Normocephalic and atraumatic.  Eyes:     Conjunctiva/sclera: Conjunctivae normal.     Pupils: Pupils are equal, round, and reactive to light.  Cardiovascular:     Rate and Rhythm: Normal rate and regular rhythm.     Heart sounds: Normal heart sounds.  Pulmonary:     Effort: Pulmonary effort is normal. No respiratory distress.     Breath sounds: Normal breath sounds.  Abdominal:     General: There is no distension.     Palpations: Abdomen is soft.     Tenderness: There is no abdominal  tenderness.  Musculoskeletal:        General: No deformity. Normal range of motion.     Cervical back: Normal range of motion and neck supple.  Skin:    General: Skin is warm and dry.  Neurological:     General: No focal deficit present.     Mental Status: He is alert and oriented to person, place, and time.     ED Results / Procedures / Treatments   Labs (all labs ordered are listed, but only abnormal results are displayed) Labs Reviewed  CBC WITH DIFFERENTIAL/PLATELET - Abnormal; Notable for the following components:      Result Value   RDW 16.0 (*)    All other components within normal limits  COMPREHENSIVE METABOLIC PANEL  LIPASE, BLOOD    EKG EKG Interpretation  Date/Time:  Wednesday Aug 09 2022 11:49:01 EDT Ventricular Rate:   49 PR Interval:  147 QRS Duration: 144 QT Interval:  505 QTC Calculation: 456 R Axis:   86 Text Interpretation: Sinus bradycardia Atrial premature complex Right bundle branch block Confirmed by Kristine Royal [id number]) on 08/09/2022 11:51:54 AM  Radiology No results found.  Procedures Procedures    Medications Ordered in ED Medications  sodium chloride 0.9 % bolus 1,000 mL (1,000 mLs Intravenous New Bag/Given 08/09/22 1223)  ondansetron (ZOFRAN) injection 4 mg (4 mg Intravenous Given 08/09/22 1223)    ED Course/ Medical Decision Making/ A&P                             Medical Decision Making Amount and/or Complexity of Data Reviewed Labs: ordered.  Risk Prescription drug management.    Medical Screen Complete  This patient presented to the ED with complaint of nausea, vomiting, diarrhea.  This complaint involves an extensive number of treatment options. The initial differential diagnosis includes, but is not limited to, metabolic abnormality, electrolyte disturbance, gastroenteritis, etc.  This presentation is: Acute, Self-Limited, Previously Undiagnosed, Uncertain Prognosis, Complicated, Systemic Symptoms, and Threat to Life/Bodily Function  Patient is presenting with complaint of acute onset nausea, vomiting, diarrhea.  Patient symptoms began earlier this morning.  Patient denies associated fever.  He denies associated abdominal pain.  Screening labs obtained are without significant abnormality other than mild hypokalemia.  Patient feels significant improved after IV fluids, potassium, and antiemetic.  Patient is taking p.o. well prior to discharge.  Patient understands plan of care for outpatient management of his symptoms.  Importance of close follow-up is stressed.  Strict return precautions given understood.  Additional history obtained:  External records from outside sources obtained and reviewed including prior ED visits and prior Inpatient records.    Lab  Tests:  I ordered and personally interpreted labs.   Cardiac Monitoring:  The patient was maintained on a cardiac monitor.  I personally viewed and interpreted the cardiac monitor which showed an underlying rhythm of: NSR   Medicines ordered:  I ordered medication including IV fluids, Zofran, potassium for dehydration, nausea, hypokalemia Reevaluation of the patient after these medicines showed that the patient: improved    Problem List / ED Course:  Nausea, vomiting, diarrhea, hypokalemia   Reevaluation:  After the interventions noted above, I reevaluated the patient and found that they have: improved   Disposition:  After consideration of the diagnostic results and the patients response to treatment, I feel that the patent would benefit from close outpatient follow-up.          Final Clinical  Impression(s) / ED Diagnoses Final diagnoses:  Nausea vomiting and diarrhea  Hypokalemia    Rx / DC Orders ED Discharge Orders          Ordered    ondansetron (ZOFRAN) 4 MG tablet  Every 6 hours        08/09/22 1430              Carlos Fines, MD 08/09/22 1432

## 2022-08-09 NOTE — ED Notes (Addendum)
Patient screams "Is there anybody to help me." Explaiend to patient we cant scream and that we are working as fast as we can to get him a room and that we can start triage orders. Patient refused stating I need to see a doctor to help me. Patient ambulatory to restroom

## 2022-08-09 NOTE — Discharge Instructions (Addendum)
Return for any problem.  ?

## 2022-08-09 NOTE — ED Triage Notes (Signed)
C/o N/V/D and cough x1 hour Denies abd pain

## 2022-08-09 NOTE — ED Notes (Signed)
Pt had a cold drink right before arrival, temp was taken auxiliary

## 2022-08-11 ENCOUNTER — Telehealth: Payer: Self-pay

## 2022-08-11 NOTE — Transitions of Care (Post Inpatient/ED Visit) (Signed)
   08/11/2022  Name: Carlos Short. MRN: [id number] DOB: [birth year]  Today's TOC FU Call Status: Today's TOC FU Call Status:: Successful TOC FU Call Competed TOC FU Call Complete Date: 08/11/22  Transition Care Management Follow-up Telephone Call Date of Discharge: 08/09/22 Discharge Facility: [facility]) Type of Discharge: Emergency Department Reason for ED Visit: Other: How have you been since you were released from the hospital?: Better Any questions or concerns?: Yes Patient Questions/Concerns:: will discuss with provider at follow up Patient Questions/Concerns Addressed: Other:  Items Reviewed: Did you receive and understand the discharge instructions provided?: Yes Medications obtained,verified, and reconciled?: Yes (Medications Reviewed) Any new allergies since your discharge?: No Dietary orders reviewed?: No  Medications Reviewed Today: Medications Reviewed Today     Reviewed by Shiela Mayer, CPhT (Pharmacy Technician) on 08/09/22 at 1151  Med List Status: Complete   Medication Order Taking? Sig Documenting Provider Last Dose Status Informant  amiodarone (PACERONE) 200 MG tablet [id number] Yes Take 1 tablet (200 mg total) by mouth daily. Monday - Friday only Graciella Freer, Cordelia Poche 08/08/2022 Active Spouse/Significant Other, Pharmacy Records  apixaban (ELIQUIS) 5 MG TABS tablet [id number] Yes Take 1 tablet (5 mg total) by mouth 2 (two) times daily. Medina-Vargas, Monina C, NP 08/09/2022 0730 Active Spouse/Significant Other, Pharmacy Records  ibandronate (BONIVA) 150 MG tablet [id number] Yes Take 150 mg by mouth every 30 (thirty) days. 3rd of each month [provider] Past Month Active Spouse/Significant Other, Pharmacy Records  memantine (NAMENDA) 5 MG tablet [id number] Yes TAKE 1 TABLET (5 MG AT NIGHT) FOR 2 WEEKS, THEN INCREASE TO 1 TABLET (5 MG) TWICE A DAY  Patient taking differently: Take 5 mg by mouth every 14 (fourteen) days.   Marcos Eke, PA-C 08/09/2022 Active Spouse/Significant Other, Pharmacy Records  ondansetron (ZOFRAN) 4 MG tablet [id number] No Take 1 tablet (4 mg total) by mouth every 8 (eight) hours as needed for nausea or vomiting.  Patient not taking: Reported on 08/09/2022   Medina-Vargas, Margit Banda, NP Not Taking Active Spouse/Significant Other, Pharmacy Records  rOPINIRole (REQUIP) 2 MG tablet [id number] Yes Take 1 tablet (2 mg total) by mouth at bedtime. Medina-Vargas, Monina C, NP 08/08/2022 Active Spouse/Significant Other, Pharmacy Records  traMADol (ULTRAM) 50 MG tablet [id number] No Take 1 tablet (50 mg total) by mouth every 12 (twelve) hours as needed (pain). Medina-Vargas, Monina C, NP More than a month Active Spouse/Significant Other, Pharmacy Records  traZODone (DESYREL) 100 MG tablet [id number] Yes Take 0.5 tablets (50 mg total) by mouth at bedtime. Medina-Vargas, Monina C, NP 08/08/2022 Active Spouse/Significant Other, Pharmacy Records            Home Care and Equipment/Supplies: Were Home Health Services Ordered?: NA Any new equipment or medical supplies ordered?: NA  Functional Questionnaire: Do you need assistance with bathing/showering or dressing?: No Do you need assistance with meal preparation?: No Do you need assistance with eating?: No Do you have difficulty maintaining continence: No Do you need assistance with getting out of bed/getting out of a chair/moving?: No Do you have difficulty managing or taking your medications?: Yes  Follow up appointments reviewed: PCP Follow-up appointment confirmed?: Yes Date of PCP follow-up appointment?: 08/14/22 Follow-up Provider: [facility] Follow-up appointment confirmed?: NA Do you understand care options if your condition(s) worsen?: Yes-patient verbalized understanding    SIGNATURE.: [facility]

## 2022-08-14 ENCOUNTER — Ambulatory Visit (INDEPENDENT_AMBULATORY_CARE_PROVIDER_SITE_OTHER): Payer: Medicare Other | Admitting: Adult Health

## 2022-08-14 ENCOUNTER — Encounter: Payer: Self-pay | Admitting: Adult Health

## 2022-08-14 VITALS — BP 120/78 | HR 59 | Temp 98.3°F | Resp 18 | Ht 66.0 in | Wt 157.8 lb

## 2022-08-14 DIAGNOSIS — I480 Paroxysmal atrial fibrillation: Secondary | ICD-10-CM | POA: Diagnosis not present

## 2022-08-14 DIAGNOSIS — F5101 Primary insomnia: Secondary | ICD-10-CM | POA: Diagnosis not present

## 2022-08-14 DIAGNOSIS — G2581 Restless legs syndrome: Secondary | ICD-10-CM | POA: Diagnosis not present

## 2022-08-14 DIAGNOSIS — E876 Hypokalemia: Secondary | ICD-10-CM | POA: Diagnosis not present

## 2022-08-14 DIAGNOSIS — F2089 Other schizophrenia: Secondary | ICD-10-CM

## 2022-08-14 DIAGNOSIS — F01A0 Vascular dementia, mild, without behavioral disturbance, psychotic disturbance, mood disturbance, and anxiety: Secondary | ICD-10-CM

## 2022-08-14 DIAGNOSIS — F319 Bipolar disorder, unspecified: Secondary | ICD-10-CM

## 2022-08-14 NOTE — Progress Notes (Signed)
[facility] clinic  Provider: Kenard Gower DNP  Code Status:  Full Code  Goals of Care:     08/14/2022    8:23 AM  Advanced Directives  Does Patient Have a Medical Advance Directive? No  Would patient like information on creating a medical advance directive? No - Patient declined     Chief Complaint  Patient presents with   Transitions Of Care    Transition of care     HPI: Patient is a 72 y.o. male seen today for transition of care. He was accompanied by his wife today. He was hospitalized on 07/21/22 for adjustment disorder with mixed anxiety. He cries in the morning, was not sleeping and goes out in the sun in his undergarments. He denied suicidal, homicidal and violent ideations. He was discharged and to follow up with [facility] and [facility].  On 07/26/22, he was seen at [facility] for sleep difficulties. He was discharged home and to follow up with [facility] and [facility].  On 08/09/22, he had nausea, vomiting and diarrhea. He was given IV fluids, potassium and antiemetic. He improved then was discharged home.  Wife stated that he was recently prescribed 2 new medications by [facility]. Today, he slept from 3AM to 8:30 AM. He is very verbal and has flight of ideas during the clinic visit. Wife stated that she gave the gun in the house to her son who took it to his house so patient won't be able to get it.   Wt Readings from Last 3 Encounters:  08/14/22 157 lb 12.8 oz (71.6 kg)  08/09/22 138 lb 14.2 oz (63 kg)  07/21/22 140 lb (63.5 kg)     Past Medical History:  Diagnosis Date   Acute gastritis without hemorrhage 11/09/2015   Acute lower UTI 06/13/2017   Acute metabolic encephalopathy 06/13/2017   Aneurysm of infrarenal abdominal aorta 06/13/2017   Infrarenal abdominal aortic aneurysm first noted on MRI of lumbar spine on 06/13/2017 measuring maximum diameter of 4.2 cm. CTA abd/pelvis  on 09/24/18 showed 4.2 x 4.2 cm.   Both times, the radiologist recommended follow up with ultrasound in 1 year. Records in Care Everywhere [facility].W.   Aortic atherosclerosis    Benign nevus of skin 02/17/2019   Benign prostatic hyperplasia with weak urinary stream 04/13/2016   Bilateral inguinal hernia 07/23/2012   Bipolar 1 disorder    Bladder calculus 04/19/2021   CHF (congestive heart failure) 02/01/2021   a.) TTE 02/01/2021: EF 40%, mild LVH, mild LAE, midl PR, mod TR, G2DD.   Chronic anticoagulation 04/20/2021   Chronic insomnia 01/09/2018   Chronic low back pain without sciatica 04/11/2017   Chronic pain syndrome 04/13/2016   Chronic right hip pain 04/12/2018   Closed compression fracture of L1 vertebra 10/06/2016   Complicated urinary tract infection 04/01/2021   COPD (chronic obstructive pulmonary disease)    Dementia, unclear etiology 09/14/2021   Diverticulosis    Frequent PVCs    Generalized anxiety disorder    HFrEF (heart failure with reduced ejection fraction) 04/19/2021   Hiatal hernia    HTN (hypertension) 04/19/2021   Hyperlipidemia    Hypertrophy of prostate with urinary obstruction and other lower urinary tract symptoms (LUTS)    Infrarenal abdominal aortic aneurysm (AAA) without rupture 09/24/2018   a.) CTA CAP 09/24/2018: 4.2 x 4.2 cm; b.) CT abd abd 05/29/2019: 4.0 cm; c.) aorta duplex 12/30/2020: 4.14 x 4.08 x 4.19 cm; d.) CT  abd/pel 02/21/2021: 4.6 cm; e.) CT renal 04/01/2021: 4.3 cm; f.) CT renal 04/19/2021: 4.6 cm   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   in hospital with sepsis   Long term current use of amiodarone    Long term current use of anticoagulant    a.) apixaban   Major depressive disorder    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   Nephrolithiasis 04/19/2021   Neuropathy of right foot    a.) s/p hip surgery   Osteopenia of multiple sites 01/10/2018   Osteoporosis    a.) on oral bisphosphonate   PAC (premature atrial contraction) 08/05/2021   PAF  (paroxysmal atrial fibrillation)    a.) holter 07/25/2020: NSR, PAF/flutter (14% burden) with longest lasting 45 minutes, freq PACs (24.6% burden); b.) holter 06/01/2021: NSR, episodes of NSVT, SVT (longest lasting 20 beats), freq PACs (29.3% burden), PVCs (2.6% burden), vent bi/trigeminy   RBBB (right bundle branch block)    Restless leg syndrome    a.) on ropinirole   Seborrhea 10/20/2018   Seborrheic keratoses 02/09/2019   Sepsis secondary to UTI 12/31/2020   Thrombocytopenia 12/31/2020   Urinary retention 04/19/2021   UTI (urinary tract infection) 04/19/2021   Wears glasses    Wears partial dentures    top and bottom partial    Past Surgical History:  Procedure Laterality Date   CYSTOSCOPY WITH LITHOLAPAXY N/A 05/03/2021   Procedure: CYSTOSCOPY WITH LITHOLAPAXY holmium laser;  Surgeon: Despina Arias, MD;  Location: [facility];  Service: Urology;  Laterality: N/A;   HIP ARTHROPLASTY Right 2005   HOLEP-LASER ENUCLEATION OF THE PROSTATE WITH MORCELLATION N/A 01/20/2022   Procedure: HOLEP-LASER ENUCLEATION OF THE PROSTATE WITH MORCELLATION;  Surgeon: Sondra Come, MD;  Location: [facility];  Service: Urology;  Laterality: N/A;   INGUINAL HERNIA REPAIR Bilateral 08/16/2012   Procedure: LAPAROSCOPIC BILATERAL INGUINAL HERNIA REPAIR;  Surgeon: Wilmon Arms. Corliss Skains, MD;  Location: [facility];  Service: General;  Laterality: Bilateral;   INSERTION OF MESH Bilateral 08/16/2012   Procedure: INSERTION OF MESH;  Surgeon: Wilmon Arms. Corliss Skains, MD;  Location: [facility];  Service: General;  Laterality: Bilateral;   MULTIPLE TOOTH EXTRACTIONS     TONSILLECTOMY     age 22   UPPER GI ENDOSCOPY      Allergies  Allergen Reactions   Gabapentin Other (See Comments)    crazy   Tylenol [Acetaminophen] Other (See Comments)    Hurts his kidneys    Outpatient Encounter Medications as of 08/14/2022  Medication Sig   amiodarone (PACERONE) 200 MG tablet Take 1 tablet (200 mg  total) by mouth daily. Monday - Friday only   apixaban (ELIQUIS) 5 MG TABS tablet Take 1 tablet (5 mg total) by mouth 2 (two) times daily.   ibandronate (BONIVA) 150 MG tablet Take 150 mg by mouth every 30 (thirty) days. 3rd of each month   memantine (NAMENDA) 5 MG tablet TAKE 1 TABLET (5 MG AT NIGHT) FOR 2 WEEKS, THEN INCREASE TO 1 TABLET (5 MG) TWICE A DAY   ondansetron (ZOFRAN) 4 MG tablet Take 1 tablet (4 mg total) by mouth every 6 (six) hours.   rOPINIRole (REQUIP) 2 MG tablet Take 1 tablet (2 mg total) by mouth at bedtime.   traMADol (ULTRAM) 50 MG tablet Take 1 tablet (50 mg total) by mouth every 12 (twelve) hours as needed (pain).   traZODone (DESYREL) 100 MG tablet Take 0.5 tablets (50 mg total) by mouth at bedtime.   [DISCONTINUED] ondansetron (ZOFRAN) 4  MG tablet Take 1 tablet (4 mg total) by mouth every 8 (eight) hours as needed for nausea or vomiting. (Patient not taking: Reported on 08/09/2022)   No facility-administered encounter medications on file as of 08/14/2022.    Review of Systems:  Review of Systems  Constitutional:  Negative for activity change, appetite change and fever.  HENT:  Negative for sore throat.   Eyes: Negative.   Cardiovascular:  Negative for chest pain and leg swelling.  Gastrointestinal:  Negative for abdominal distention, diarrhea and vomiting.  Genitourinary:  Negative for dysuria, frequency and urgency.  Skin:  Negative for color change.  Neurological:  Negative for dizziness and headaches.  Psychiatric/Behavioral:  Positive for decreased concentration and dysphoric mood. Negative for behavioral problems and sleep disturbance. The patient is not nervous/anxious.     Health Maintenance  Topic Date Due   Pneumonia Vaccine 97+ Years old (1 of 2 - PCV) Never done   Zoster Vaccines- Shingrix (1 of 2) Never done   Colonoscopy  Never done   COVID-19 Vaccine (3 - Pfizer risk series) 04/22/2020   INFLUENZA VACCINE  10/12/2022   Medicare Annual Wellness  (AWV)  05/23/2023   DTaP/Tdap/Td (2 - Td or Tdap) 04/10/2024   Hepatitis C Screening  Completed   HPV VACCINES  Aged Out    Physical Exam: Vitals:   08/14/22 1106  BP: 120/78  Pulse: (!) 59  Resp: 18  Temp: 98.3 F (36.8 C)  SpO2: 98%  Weight: 157 lb 12.8 oz (71.6 kg)  Height: 5\' 6"  (1.676 m)   Body mass index is 25.47 kg/m. Physical Exam Constitutional:      General: He is not in acute distress.    Appearance: Normal appearance.  HENT:     Head: Normocephalic and atraumatic.     Mouth/Throat:     Mouth: Mucous membranes are moist.  Eyes:     Conjunctiva/sclera: Conjunctivae normal.  Cardiovascular:     Rate and Rhythm: Normal rate and regular rhythm.     Pulses: Normal pulses.     Heart sounds: Normal heart sounds.  Pulmonary:     Effort: Pulmonary effort is normal.     Breath sounds: Normal breath sounds.  Abdominal:     General: Bowel sounds are normal.     Palpations: Abdomen is soft.  Musculoskeletal:        General: No swelling. Normal range of motion.     Cervical back: Normal range of motion.  Skin:    General: Skin is warm and dry.  Neurological:     General: No focal deficit present.     Mental Status: He is alert and oriented to person, place, and time.  Psychiatric:        Mood and Affect: Mood normal.        Behavior: Behavior normal.     Comments: Highly verbal     Labs reviewed: Basic Metabolic Panel: Recent Labs    09/22/21 1517 11/13/21 1710 11/29/21 0835 03/25/22 1814 05/25/22 1115 08/09/22 1129  NA  --    < >  --  135 143 140  K  --    < >  --  3.8 3.8 3.3*  CL  --    < >  --  104 106 107  CO2  --    < >  --  20* 23 23  GLUCOSE  --    < >  --  99 81 112*  BUN  --    < >  --  17 16 15   CREATININE  --    < >  --  1.12 1.21 1.07  CALCIUM  --    < >  --  8.6* 9.0 9.0  TSH 0.14*  --  0.95  --  0.308*  --    < > = values in this interval not displayed.   Liver Function Tests: Recent Labs    08/18/21 1216 05/25/22 1115  08/09/22 1129  AST 13* 17 47*  ALT 12 12 72*  ALKPHOS 44 75 74  BILITOT 0.5 0.4 1.2  PROT 6.2* 6.1 7.3  ALBUMIN 3.1* 4.0 3.6   Recent Labs    08/09/22 1129  LIPASE 45   No results for input(s): "AMMONIA" in the last 8760 hours. CBC: Recent Labs    11/13/21 1710 03/25/22 1814 08/09/22 1129  WBC 20.7* 10.5 8.6  NEUTROABS 17.6* 7.6 7.3  HGB 13.7 13.3 14.3  HCT 40.4 40.8 42.9  MCV 88.8 86.1 88.5  PLT 178 128* 176   Lipid Panel: Recent Labs    11/29/21 0835  CHOL 179  HDL 63  LDLCALC 101*  TRIG 64  CHOLHDL 2.8   Lab Results  Component Value Date   HGBA1C 5.5 11/29/2021    Procedures since last visit: No results found.  Assessment/Plan  1. Hypokalemia Lab Results  Component Value Date   K 3.3 (L) 08/09/2022   -  was repleted in the ED - Basic Metabolic Panel with eGFR  2. Primary insomnia -  wife cannot recall name of 2 new medications which seems to help with sleeping -  continue Trazodone  3. RLS (restless legs syndrome) -  stable -  continue Ropinirole  4. PAF (paroxysmal atrial fibrillation) (HCC) -  rate-controlled -  continue Amiodarone and Eliquis  5. Bipolar 1 disorder [facility]) -  wife cannot recall name of 2 new medications -  follows up with [facility]  6. Other schizophrenia [facility]) -  wife cannot recall  name of 2 new medications -  follows up with [facility]  7. 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

## 2022-08-14 NOTE — Progress Notes (Incomplete)
[facility]  Provider: Kenard Gower DNP  Code Status: *** Goals of Care:     08/14/2022    8:23 AM  Advanced Directives  Does Patient Have a Medical Advance Directive? No  Would patient like information on creating a medical advance directive? No - Patient declined     Chief Complaint  Patient presents with  . Transitions Of Care    Transition of care     HPI: Patient is a 72 y.o. male seen today for transition of care. He was accompanied by his wife today. He was hospitalized on 07/21/22 for adjustment disorder with mixed anxiety. He cries in the morning, was not sleeping and goes out in the sun in his undergarments. He denied suicidal, homicidal and violent ideations. He was discharged and to follow up with [facility]rology and [facility].  On 07/26/22, he was seen at [facility] for sleep difficulties. He was discharged home and to follow up with [facility] Neurology and [facility].  On 08/09/22, he had nausea, vomiting and diarrhea   Wt Readings from Last 3 Encounters:  08/14/22 157 lb 12.8 oz (71.6 kg)  08/09/22 138 lb 14.2 oz (63 kg)  07/21/22 140 lb (63.5 kg)     Past Medical History:  Diagnosis Date  . Acute gastritis without hemorrhage 11/09/2015  . Acute lower UTI 06/13/2017  . Acute metabolic encephalopathy 06/13/2017  . Aneurysm of infrarenal abdominal aorta 06/13/2017   Infrarenal abdominal aortic aneurysm first noted on MRI of lumbar spine on 06/13/2017 measuring maximum diameter of 4.2 cm. CTA abd/pelvis on 09/24/18 showed 4.2 x 4.2 cm.   Both times, the radiologist recommended follow up with ultrasound in 1 year. Records in Care Everywhere [facility].  . Aortic atherosclerosis   . Benign nevus of skin 02/17/2019  . Benign prostatic hyperplasia with weak urinary stream 04/13/2016  . Bilateral inguinal hernia 07/23/2012  . Bipolar 1 disorder   . Bladder calculus 04/19/2021  . CHF (congestive heart failure)  02/01/2021   a.) TTE 02/01/2021: EF 40%, mild LVH, mild LAE, midl PR, mod TR, G2DD.  Marland Kitchen Chronic anticoagulation 04/20/2021  . Chronic insomnia 01/09/2018  . Chronic low back pain without sciatica 04/11/2017  . Chronic pain syndrome 04/13/2016  . Chronic right hip pain 04/12/2018  . Closed compression fracture of L1 vertebra 10/06/2016  . Complicated urinary tract infection 04/01/2021  . COPD (chronic obstructive pulmonary disease)   . Dementia, unclear etiology 09/14/2021  . Diverticulosis   . Frequent PVCs   . Generalized anxiety disorder   . HFrEF (heart failure with reduced ejection fraction) 04/19/2021  . Hiatal hernia   . HTN (hypertension) 04/19/2021  . Hyperlipidemia   . Hypertrophy of prostate with urinary obstruction and other lower urinary tract symptoms (LUTS)   . Infrarenal abdominal aortic aneurysm (AAA) without rupture 09/24/2018   a.) CTA CAP 09/24/2018: 4.2 x 4.2 cm; b.) CT abd abd 05/29/2019: 4.0 cm; c.) aorta duplex 12/30/2020: 4.14 x 4.08 x 4.19 cm; d.) CT abd/pel 02/21/2021: 4.6 cm; e.) CT renal 04/01/2021: 4.3 cm; f.) CT renal 04/19/2021: 4.6 cm  . 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 Long term current use of amiodarone   . Long term current use of anticoagulant    a.) apixaban  . Major depressive disorder   . 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  . Nephrolithiasis 04/19/2021  . Neuropathy of right foot    a.) s/p hip surgery  .  Osteopenia of multiple sites 01/10/2018  . Osteoporosis    a.) on oral bisphosphonate  . PAC (premature atrial contraction) 08/05/2021  . PAF (paroxysmal atrial fibrillation)    a.) holter 07/25/2020: NSR, PAF/flutter (14% burden) with longest lasting 45 minutes, freq PACs (24.6% burden); b.) holter 06/01/2021: NSR, episodes of NSVT, SVT (longest lasting 20 beats), freq PACs (29.3% burden), PVCs (2.6% burden), vent bi/trigeminy  . RBBB (right bundle branch block)   . Restless leg syndrome    a.) on ropinirole  .  Seborrhea 10/20/2018  . Seborrheic keratoses 02/09/2019  . Sepsis secondary to UTI 12/31/2020  . Thrombocytopenia 12/31/2020  . Urinary retention 04/19/2021  . UTI (urinary tract infection) 04/19/2021  . Wears glasses   . Wears partial dentures    top and bottom partial    Past Surgical History:  Procedure Laterality Date  . CYSTOSCOPY WITH LITHOLAPAXY N/A 05/03/2021   Procedure: CYSTOSCOPY WITH LITHOLAPAXY holmium laser;  Surgeon: Despina Arias, MD;  Location: [facility];  Service: Urology;  Laterality: N/A;  . HIP ARTHROPLASTY Right 2005  . HOLEP-LASER ENUCLEATION OF THE PROSTATE WITH MORCELLATION N/A 01/20/2022   Procedure: HOLEP-LASER ENUCLEATION OF THE PROSTATE WITH MORCELLATION;  Surgeon: Sondra Come, MD;  Location: [facility];  Service: Urology;  Laterality: N/A;  . INGUINAL HERNIA REPAIR Bilateral 08/16/2012   Procedure: LAPAROSCOPIC BILATERAL INGUINAL HERNIA REPAIR;  Surgeon: Wilmon Arms. Corliss Skains, MD;  Location: [facility];  Service: General;  Laterality: Bilateral;  . INSERTION OF MESH Bilateral 08/16/2012   Procedure: INSERTION OF MESH;  Surgeon: Wilmon Arms. Corliss Skains, MD;  Location: [facility];  Service: General;  Laterality: Bilateral;  . MULTIPLE TOOTH EXTRACTIONS    . TONSILLECTOMY     age 23  . UPPER GI ENDOSCOPY      Allergies  Allergen Reactions  . Gabapentin Other (See Comments)    crazy  . Tylenol [Acetaminophen] Other (See Comments)    Hurts his kidneys    Outpatient Encounter Medications as of 08/14/2022  Medication Sig  . amiodarone (PACERONE) 200 MG tablet Take 1 tablet (200 mg total) by mouth daily. Monday - Friday only  . apixaban (ELIQUIS) 5 MG TABS tablet Take 1 tablet (5 mg total) by mouth 2 (two) times daily.  Marland Kitchen ibandronate (BONIVA) 150 MG tablet Take 150 mg by mouth every 30 (thirty) days. 3rd of each month  . memantine (NAMENDA) 5 MG tablet TAKE 1 TABLET (5 MG AT NIGHT) FOR 2 WEEKS, THEN INCREASE TO 1 TABLET (5 MG) TWICE A  DAY  . ondansetron (ZOFRAN) 4 MG tablet Take 1 tablet (4 mg total) by mouth every 6 (six) hours.  Marland Kitchen rOPINIRole (REQUIP) 2 MG tablet Take 1 tablet (2 mg total) by mouth at bedtime.  . traMADol (ULTRAM) 50 MG tablet Take 1 tablet (50 mg total) by mouth every 12 (twelve) hours as needed (pain).  . traZODone (DESYREL) 100 MG tablet Take 0.5 tablets (50 mg total) by mouth at bedtime.  . [DISCONTINUED] ondansetron (ZOFRAN) 4 MG tablet Take 1 tablet (4 mg total) by mouth every 8 (eight) hours as needed for nausea or vomiting. (Patient not taking: Reported on 08/09/2022)   No facility-administered encounter medications on file as of 08/14/2022.    Review of Systems:  Review of Systems  Constitutional:  Negative for activity change, appetite change and fever.  HENT:  Negative for sore throat.   Eyes: Negative.   Cardiovascular:  Negative for chest pain and leg swelling.  Gastrointestinal:  Negative for abdominal distention, diarrhea and vomiting.  Genitourinary:  Negative for dysuria, frequency and urgency.  Skin:  Negative for color change.  Neurological:  Negative for dizziness and headaches.  Psychiatric/Behavioral:  Negative for behavioral problems and sleep disturbance. The patient is not nervous/anxious.     Health Maintenance  Topic Date Due  . Pneumonia Vaccine 67+ Years old (1 of 2 - PCV) Never done  . Zoster Vaccines- Shingrix (1 of 2) Never done  . Colonoscopy  Never done  . COVID-19 Vaccine (3 - Pfizer risk series) 04/22/2020  . INFLUENZA VACCINE  10/12/2022  . Medicare Annual Wellness (AWV)  05/23/2023  . DTaP/Tdap/Td (2 - Td or Tdap) 04/10/2024  . Hepatitis C Screening  Completed  . HPV VACCINES  Aged Out    Physical Exam: Vitals:   08/14/22 1106  BP: 120/78  Pulse: (!) 59  Resp: 18  Temp: 98.3 F (36.8 C)  SpO2: 98%  Weight: 157 lb 12.8 oz (71.6 kg)  Height: 5\' 6"  (1.676 m)   Body mass index is 25.47 kg/m. Physical Exam Constitutional:      Appearance: Normal  appearance.  HENT:     Head: Normocephalic and atraumatic.     Mouth/Throat:     Mouth: Mucous membranes are moist.  Eyes:     Conjunctiva/sclera: Conjunctivae normal.  Cardiovascular:     Rate and Rhythm: Normal rate and regular rhythm.     Pulses: Normal pulses.     Heart sounds: Normal heart sounds.  Pulmonary:     Effort: Pulmonary effort is normal.     Breath sounds: Normal breath sounds.  Abdominal:     General: Bowel sounds are normal.     Palpations: Abdomen is soft.  Musculoskeletal:        General: No swelling. Normal range of motion.     Cervical back: Normal range of motion.  Skin:    General: Skin is warm and dry.  Neurological:     General: No focal deficit present.     Mental Status: He is alert and oriented to person, place, and time.  Psychiatric:        Mood and Affect: Mood normal.        Behavior: Behavior normal.        Thought Content: Thought content normal.        Judgment: Judgment normal.    Labs reviewed: Basic Metabolic Panel: Recent Labs    09/22/21 1517 11/13/21 1710 11/29/21 0835 03/25/22 1814 05/25/22 1115 08/09/22 1129  NA  --    < >  --  135 143 140  K  --    < >  --  3.8 3.8 3.3*  CL  --    < >  --  104 106 107  CO2  --    < >  --  20* 23 23  GLUCOSE  --    < >  --  99 81 112*  BUN  --    < >  --  17 16 15   CREATININE  --    < >  --  1.12 1.21 1.07  CALCIUM  --    < >  --  8.6* 9.0 9.0  TSH 0.14*  --  0.95  --  0.308*  --    < > = values in this interval not displayed.   Liver Function Tests: Recent Labs    08/18/21 1216 05/25/22 1115 08/09/22 1129  AST 13* 17  47*  ALT 12 12 72*  ALKPHOS 44 75 74  BILITOT 0.5 0.4 1.2  PROT 6.2* 6.1 7.3  ALBUMIN 3.1* 4.0 3.6   Recent Labs    08/09/22 1129  LIPASE 45   No results for input(s): "AMMONIA" in the last 8760 hours. CBC: Recent Labs    11/13/21 1710 03/25/22 1814 08/09/22 1129  WBC 20.7* 10.5 8.6  NEUTROABS 17.6* 7.6 7.3  HGB 13.7 13.3 14.3  HCT 40.4 40.8 42.9   MCV 88.8 86.1 88.5  PLT 178 128* 176   Lipid Panel: Recent Labs    11/29/21 0835  CHOL 179  HDL 63  LDLCALC 101*  TRIG 64  CHOLHDL 2.8   Lab Results  Component Value Date   HGBA1C 5.5 11/29/2021    Procedures since last visit: No results found.  Assessment/Plan    Labs/tests ordered:    Next appt:  10/02/2022

## 2022-08-15 LAB — BASIC METABOLIC PANEL WITH GFR
BUN: 14 mg/dL (ref 7–25)
CO2: 26 mmol/L (ref 20–32)
Calcium: 9 mg/dL (ref 8.6–10.3)
Chloride: 108 mmol/L (ref 98–110)
Creat: 1 mg/dL (ref 0.70–1.28)
Glucose, Bld: 90 mg/dL (ref 65–99)
Potassium: 4.3 mmol/L (ref 3.5–5.3)
Sodium: 142 mmol/L (ref 135–146)
eGFR: 80 mL/min/{1.73_m2} (ref >=60)

## 2022-08-15 NOTE — Progress Notes (Signed)
K now within normal.

## 2022-08-15 NOTE — Telephone Encounter (Signed)
Will need to discontinue Ropinirole and continue Olanzapine.

## 2022-08-15 NOTE — Addendum Note (Signed)
Addended by: Michaell Cowing on: 08/15/2022 10:39 AM   Modules accepted: Orders

## 2022-08-16 ENCOUNTER — Other Ambulatory Visit: Payer: Self-pay | Admitting: Adult Health

## 2022-08-16 DIAGNOSIS — I480 Paroxysmal atrial fibrillation: Secondary | ICD-10-CM

## 2022-10-02 ENCOUNTER — Encounter: Payer: Self-pay | Admitting: Adult Health

## 2022-10-02 ENCOUNTER — Ambulatory Visit (INDEPENDENT_AMBULATORY_CARE_PROVIDER_SITE_OTHER): Payer: Medicare Other | Admitting: Adult Health

## 2022-10-02 VITALS — BP 121/88 | HR 61 | Temp 98.1°F | Resp 18 | Ht 66.0 in | Wt 166.2 lb

## 2022-10-02 DIAGNOSIS — M545 Low back pain, unspecified: Secondary | ICD-10-CM

## 2022-10-02 DIAGNOSIS — I480 Paroxysmal atrial fibrillation: Secondary | ICD-10-CM

## 2022-10-02 DIAGNOSIS — R630 Anorexia: Secondary | ICD-10-CM | POA: Diagnosis not present

## 2022-10-02 DIAGNOSIS — F319 Bipolar disorder, unspecified: Secondary | ICD-10-CM

## 2022-10-02 DIAGNOSIS — G8929 Other chronic pain: Secondary | ICD-10-CM

## 2022-10-02 DIAGNOSIS — F01A0 Vascular dementia, mild, without behavioral disturbance, psychotic disturbance, mood disturbance, and anxiety: Secondary | ICD-10-CM | POA: Diagnosis not present

## 2022-10-02 DIAGNOSIS — F5101 Primary insomnia: Secondary | ICD-10-CM | POA: Diagnosis not present

## 2022-10-02 MED ORDER — TRAMADOL HCL 50 MG PO TABS
50.0000 mg | ORAL_TABLET | Freq: Two times a day (BID) | ORAL | 0 refills | Status: DC | PRN
Start: 2022-10-02 — End: 2022-11-27

## 2022-10-02 MED ORDER — TRAZODONE HCL 100 MG PO TABS
50.0000 mg | ORAL_TABLET | Freq: Every day | ORAL | 1 refills | Status: AC
Start: 2022-10-02 — End: ?

## 2022-10-02 NOTE — Progress Notes (Signed)
[facility]  Provider: Kenard Gower DNP  Code Status:   Full Code  Goals of Care:     10/02/2022   11:12 AM  Advanced Directives  Does Patient Have a Medical Advance Directive? No  Would patient like information on creating a medical advance directive? No - Patient declined     Chief Complaint  Patient presents with   Follow-up    Three Month Follow-up    HPI: Patient is a 72 y.o. male seen today for medical management of chronic issues. 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, psychotic disturbance, mood disturbance, or anxiety (HCC) - takes Memantine  Poor appetite -  takes Remeron which was increased from 7.5 mg to 15 mg at bedtime, eating better, gained ~ 8 lbs in 7 wks Wt Readings from Last 3 Encounters:  10/02/22 166 lb 3.2 oz (75.4 kg)  08/14/22 157 lb 12.8 oz (71.6 kg)  08/09/22 138 lb 14.2 oz (63 kg)     PAF (paroxysmal atrial fibrillation) (HCC)  -  no palpitations, takes Amiodarone and Eliquis, tsh low 0.308  Primary insomnia -  sleeping better, taking Trazodone 50 mg at HS  Bipolar 1 disorder (HCC) - Takes Zyprexa 5 mg at HS and Mirtzapine and increased to 15 mg and  Trazodone 50 mg at bedtime, sleeping and does not get "high" , not awake all night  Chronic midline low back pain without sciatica -  no pain, Tramadol PRN  Past Medical History:  Diagnosis Date   Acute gastritis without hemorrhage 11/09/2015   Acute lower UTI 06/13/2017   Acute metabolic encephalopathy 06/13/2017   Aneurysm of infrarenal abdominal aorta 06/13/2017   Infrarenal abdominal aortic aneurysm first noted on MRI of lumbar spine on 06/13/2017 measuring maximum diameter of 4.2 cm. CTA abd/pelvis on 09/24/18 showed 4.2 x 4.2 cm.   Both times, the radiologist recommended follow up with ultrasound in 1 year. Records in Care Everywhere [facility].   Aortic atherosclerosis    Benign nevus of skin 02/17/2019    Benign prostatic hyperplasia with weak urinary stream 04/13/2016   Bilateral inguinal hernia 07/23/2012   Bipolar 1 disorder    Bladder calculus 04/19/2021   CHF (congestive heart failure) 02/01/2021   a.) TTE 02/01/2021: EF 40%, mild LVH, mild LAE, midl PR, mod TR, G2DD.   Chronic anticoagulation 04/20/2021   Chronic insomnia 01/09/2018   Chronic low back pain without sciatica 04/11/2017   Chronic pain syndrome 04/13/2016   Chronic right hip pain 04/12/2018   Closed compression fracture of L1 vertebra 10/06/2016   Complicated urinary tract infection 04/01/2021   COPD (chronic obstructive pulmonary disease)    Dementia, unclear etiology 09/14/2021   Diverticulosis    Frequent PVCs    Generalized anxiety disorder    HFrEF (heart failure with reduced ejection fraction) 04/19/2021   Hiatal hernia    HTN (hypertension) 04/19/2021   Hyperlipidemia    Hypertrophy of prostate with urinary obstruction and other lower urinary tract symptoms (LUTS)    Infrarenal abdominal aortic aneurysm (AAA) without rupture 09/24/2018   a.) CTA CAP 09/24/2018: 4.2 x 4.2 cm; b.) CT abd abd 05/29/2019: 4.0 cm; c.) aorta duplex 12/30/2020: 4.14 x 4.08 x 4.19 cm; d.) CT abd/pel 02/21/2021: 4.6 cm; e.) CT renal 04/01/2021: 4.3 cm; f.) CT renal 04/19/2021: 4.6 cm   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   in hospital with sepsis   Long term current use of amiodarone  Long term current use of anticoagulant    a.) apixaban   Major depressive disorder    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   Nephrolithiasis 04/19/2021   Neuropathy of right foot    a.) s/p hip surgery   Osteopenia of multiple sites 01/10/2018   Osteoporosis    a.) on oral bisphosphonate   PAC (premature atrial contraction) 08/05/2021   PAF (paroxysmal atrial fibrillation)    a.) holter 07/25/2020: NSR, PAF/flutter (14% burden) with longest lasting 45 minutes, freq PACs (24.6% burden); b.) holter 06/01/2021: NSR, episodes of NSVT, SVT (longest lasting  20 beats), freq PACs (29.3% burden), PVCs (2.6% burden), vent bi/trigeminy   RBBB (right bundle branch block)    Restless leg syndrome    a.) on ropinirole   Seborrhea 10/20/2018   Seborrheic keratoses 02/09/2019   Sepsis secondary to UTI 12/31/2020   Thrombocytopenia 12/31/2020   Urinary retention 04/19/2021   UTI (urinary tract infection) 04/19/2021   Wears glasses    Wears partial dentures    top and bottom partial    Past Surgical History:  Procedure Laterality Date   CYSTOSCOPY WITH LITHOLAPAXY N/A 05/03/2021   Procedure: CYSTOSCOPY WITH LITHOLAPAXY holmium laser;  Surgeon: Despina Arias, MD;  Location: [facility];  Service: Urology;  Laterality: N/A;   HIP ARTHROPLASTY Right 2005   HOLEP-LASER ENUCLEATION OF THE PROSTATE WITH MORCELLATION N/A 01/20/2022   Procedure: HOLEP-LASER ENUCLEATION OF THE PROSTATE WITH MORCELLATION;  Surgeon: Sondra Come, MD;  Location: [facility];  Service: Urology;  Laterality: N/A;   INGUINAL HERNIA REPAIR Bilateral 08/16/2012   Procedure: LAPAROSCOPIC BILATERAL INGUINAL HERNIA REPAIR;  Surgeon: Wilmon Arms. Corliss Skains, MD;  Location: [facility];  Service: General;  Laterality: Bilateral;   INSERTION OF MESH Bilateral 08/16/2012   Procedure: INSERTION OF MESH;  Surgeon: Wilmon Arms. Corliss Skains, MD;  Location: [facility];  Service: General;  Laterality: Bilateral;   MULTIPLE TOOTH EXTRACTIONS     TONSILLECTOMY     age 26   UPPER GI ENDOSCOPY      Allergies  Allergen Reactions   Gabapentin Other (See Comments)    crazy   Tylenol [Acetaminophen] Other (See Comments)    Hurts his kidneys    Outpatient Encounter Medications as of 10/02/2022  Medication Sig   amiodarone (PACERONE) 200 MG tablet Take 1 tablet (200 mg total) by mouth daily. Monday - Friday only   ELIQUIS 5 MG TABS tablet TAKE 1 TABLET BY MOUTH TWICE A DAY   ibandronate (BONIVA) 150 MG tablet Take 150 mg by mouth every 30 (thirty) days. 3rd of each month    memantine (NAMENDA) 5 MG tablet TAKE 1 TABLET (5 MG AT NIGHT) FOR 2 WEEKS, THEN INCREASE TO 1 TABLET (5 MG) TWICE A DAY   mirtazapine (REMERON) 15 MG tablet Take 15 mg by mouth at bedtime.   OLANZapine (ZYPREXA) 5 MG tablet Take 5 mg by mouth at bedtime.   ondansetron (ZOFRAN) 4 MG tablet Take 1 tablet (4 mg total) by mouth every 6 (six) hours.   traMADol (ULTRAM) 50 MG tablet Take 1 tablet (50 mg total) by mouth every 12 (twelve) hours as needed (pain).   traZODone (DESYREL) 100 MG tablet Take 0.5 tablets (50 mg total) by mouth at bedtime.   [DISCONTINUED] mirtazapine (REMERON) 7.5 MG tablet Take 7.5 mg by mouth at bedtime.   [DISCONTINUED] OLANZapine (ZYPREXA) 2.5 MG tablet Take 2.5 mg by mouth at bedtime.   No facility-administered encounter medications on file as of  10/02/2022.    Review of Systems:  Review of Systems  Constitutional:  Negative for activity change, appetite change and fever.  HENT:  Negative for sore throat.   Eyes: Negative.   Cardiovascular:  Negative for chest pain and leg swelling.  Gastrointestinal:  Negative for abdominal distention, diarrhea and vomiting.  Genitourinary:  Negative for dysuria, frequency and urgency.  Skin:  Negative for color change.  Neurological:  Negative for dizziness and headaches.  Psychiatric/Behavioral:  Negative for behavioral problems and sleep disturbance. The patient is not nervous/anxious.     Health Maintenance  Topic Date Due   Pneumonia Vaccine 60+ Years old (1 of 2 - PCV) Never done   Zoster Vaccines- Shingrix (1 of 2) Never done   Colonoscopy  Never done   COVID-19 Vaccine (3 - Pfizer risk series) 04/22/2020   INFLUENZA VACCINE  10/12/2022   Medicare Annual Wellness (AWV)  05/23/2023   DTaP/Tdap/Td (2 - Td or Tdap) 04/10/2024   Hepatitis C Screening  Completed   HPV VACCINES  Aged Out    Physical Exam: Vitals:   10/02/22 1118  BP: 121/88  Pulse: 61  Resp: 18  Temp: 98.1 F (36.7 C)  SpO2: 96%  Weight: 166 lb  3.2 oz (75.4 kg)  Height: 5\' 6"  (1.676 m)   Body mass index is 26.83 kg/m. Physical Exam Constitutional:      Appearance: Normal appearance.  HENT:     Head: Normocephalic and atraumatic.     Mouth/Throat:     Mouth: Mucous membranes are moist.  Eyes:     Conjunctiva/sclera: Conjunctivae normal.  Cardiovascular:     Rate and Rhythm: Normal rate and regular rhythm.     Pulses: Normal pulses.     Heart sounds: Normal heart sounds.  Pulmonary:     Effort: Pulmonary effort is normal.     Breath sounds: Normal breath sounds.  Abdominal:     General: Bowel sounds are normal.     Palpations: Abdomen is soft.  Musculoskeletal:        General: No swelling. Normal range of motion.     Cervical back: Normal range of motion.  Skin:    General: Skin is warm and dry.  Neurological:     General: No focal deficit present.     Mental Status: He is alert and oriented to person, place, and time.  Psychiatric:        Mood and Affect: Mood normal.        Behavior: Behavior normal.        Thought Content: Thought content normal.        Judgment: Judgment normal.     Labs reviewed: Basic Metabolic Panel: Recent Labs    11/29/21 0835 03/25/22 1814 05/25/22 1115 08/09/22 1129 08/14/22 1209  NA  --    < > 143 140 142  K  --    < > 3.8 3.3* 4.3  CL  --    < > 106 107 108  CO2  --    < > 23 23 26   GLUCOSE  --    < > 81 112* 90  BUN  --    < > 16 15 14   CREATININE  --    < > 1.21 1.07 1.00  CALCIUM  --    < > 9.0 9.0 9.0  TSH 0.95  --  0.308*  --   --    < > = values in this interval not displayed.  Liver Function Tests: Recent Labs    05/25/22 1115 08/09/22 1129  AST 17 47*  ALT 12 72*  ALKPHOS 75 74  BILITOT 0.4 1.2  PROT 6.1 7.3  ALBUMIN 4.0 3.6   Recent Labs    08/09/22 1129  LIPASE 45   No results for input(s): "AMMONIA" in the last 8760 hours. CBC: Recent Labs    11/13/21 1710 03/25/22 1814 08/09/22 1129  WBC 20.7* 10.5 8.6  NEUTROABS 17.6* 7.6 7.3  HGB  13.7 13.3 14.3  HCT 40.4 40.8 42.9  MCV 88.8 86.1 88.5  PLT 178 128* 176   Lipid Panel: Recent Labs    11/29/21 0835  CHOL 179  HDL 63  LDLCALC 101*  TRIG 64  CHOLHDL 2.8   Lab Results  Component Value Date   HGBA1C 5.5 11/29/2021    Procedures since last visit: No results found.  Assessment/Plan  1. 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 Poor appetite -  gaining weight and has good appetite - mirtazapine (REMERON) 15 MG tablet; Take 15 mg by mouth at bedtime.  3. PAF (paroxysmal atrial fibrillation) (HCC) -  rate controlled -  continue Amiodarone and Eliquis - TSH  4. Primary insomnia -  sleeping better - traZODone (DESYREL) 100 MG tablet; Take 0.5 tablets (50 mg total) by mouth at bedtime.  Dispense: 45 tablet; Refill: 1  5. Bipolar 1 disorder (HCC) -  wasn't tolerating Olanzapine 10 mg so it was decreased to 5 mg at HS - OLANZapine (ZYPREXA) 5 MG tablet; Take 5 mg by mouth at bedtime.  6. Chronic midline low back pain without sciatica -  stable - traMADol (ULTRAM) 50 MG tablet; Take 1 tablet (50 mg total) by mouth every 12 (twelve) hours as needed (pain).  Dispense: 30 tablet; Refill: 0       Labs/tests ordered:  tsh  Next appt:  Visit date not found

## 2022-10-03 LAB — TSH: TSH: 1.04 mIU/L (ref 0.40–4.50)

## 2022-10-03 NOTE — Progress Notes (Signed)
Tsh normal

## 2022-11-27 ENCOUNTER — Other Ambulatory Visit: Payer: Self-pay | Admitting: Adult Health

## 2022-11-27 DIAGNOSIS — M545 Low back pain, unspecified: Secondary | ICD-10-CM

## 2022-11-27 NOTE — Telephone Encounter (Signed)
Patient has request refill on medication Tramadol 50mg . Patient medication last refilled 10/02/2022. Patient has Opioid Contract on file dated 02/28/2022. Patient medication pend and sent to PCP Medina-Vargas, Margit Banda, NP for approval.

## 2022-12-11 ENCOUNTER — Ambulatory Visit: Payer: Medicare Other | Admitting: Physician Assistant

## 2022-12-19 ENCOUNTER — Other Ambulatory Visit: Payer: Self-pay | Admitting: Adult Health

## 2022-12-19 DIAGNOSIS — G8929 Other chronic pain: Secondary | ICD-10-CM

## 2022-12-19 NOTE — Telephone Encounter (Signed)
Patient is requesting a refill of the following medications: Requested Prescriptions   Pending Prescriptions Disp Refills   traMADol (ULTRAM) 50 MG tablet [Pharmacy Med Name: TRAMADOL HCL 50 MG TABLET] 30 tablet 0    Sig: TAKE 1 TABLET (50 MG TOTAL) BY MOUTH EVERY 12 (TWELVE) HOURS AS NEEDED (PAIN).    Date of last refill:11/27/2022  Refill amount: 0  Treatment agreement date: 02/27/2022

## 2022-12-26 ENCOUNTER — Ambulatory Visit: Payer: 59 | Admitting: Physician Assistant

## 2023-01-02 ENCOUNTER — Other Ambulatory Visit: Payer: Self-pay | Admitting: Adult Health

## 2023-01-02 DIAGNOSIS — I480 Paroxysmal atrial fibrillation: Secondary | ICD-10-CM

## 2023-01-04 ENCOUNTER — Ambulatory Visit (INDEPENDENT_AMBULATORY_CARE_PROVIDER_SITE_OTHER): Payer: 59 | Admitting: Adult Health

## 2023-01-04 ENCOUNTER — Encounter: Payer: Self-pay | Admitting: Adult Health

## 2023-01-04 VITALS — BP 121/78 | HR 64 | Temp 98.1°F | Resp 18 | Ht 66.0 in | Wt 175.8 lb

## 2023-01-04 DIAGNOSIS — M545 Low back pain, unspecified: Secondary | ICD-10-CM

## 2023-01-04 DIAGNOSIS — F01A0 Vascular dementia, mild, without behavioral disturbance, psychotic disturbance, mood disturbance, and anxiety: Secondary | ICD-10-CM

## 2023-01-04 DIAGNOSIS — Z1211 Encounter for screening for malignant neoplasm of colon: Secondary | ICD-10-CM | POA: Diagnosis not present

## 2023-01-04 DIAGNOSIS — I480 Paroxysmal atrial fibrillation: Secondary | ICD-10-CM

## 2023-01-04 DIAGNOSIS — G8929 Other chronic pain: Secondary | ICD-10-CM

## 2023-01-04 DIAGNOSIS — F319 Bipolar disorder, unspecified: Secondary | ICD-10-CM

## 2023-01-04 DIAGNOSIS — Z2821 Immunization not carried out because of patient refusal: Secondary | ICD-10-CM

## 2023-01-04 DIAGNOSIS — F5101 Primary insomnia: Secondary | ICD-10-CM

## 2023-01-04 MED ORDER — TRAMADOL HCL 50 MG PO TABS
50.0000 mg | ORAL_TABLET | Freq: Two times a day (BID) | ORAL | 0 refills | Status: DC | PRN
Start: 2023-01-04 — End: 2023-02-12

## 2023-01-04 NOTE — Progress Notes (Signed)
[facility]  Provider:  Kenard Gower DNP  Code Status:  Full Code  Goals of Care:     10/02/2022   11:12 AM  Advanced Directives  Does Patient Have a Medical Advance Directive? No  Would patient like information on creating a medical advance directive? No - Patient declined     Chief Complaint  Patient presents with   Follow-up    3 mth fu    Immunizations    Influenza, Pneumonia, Shingrix and Covid   Quality Metric Gaps    Colonoscopy    HPI: Patient is a 72 y.o. male seen today for medical management of chronic issues. He was accompanied by his wife.  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 CTA abd/pelvis on 09/24/18 showed 4.2 x 4.2 cm.   Both times, the radiologist recommended follow up with ultrasound in 1 year. Records in Care Everywhere F.W.   Aortic atherosclerosis    Benign nevus of skin 02/17/2019   Benign prostatic hyperplasia with weak urinary stream 04/13/2016   Bilateral inguinal hernia 07/23/2012   Bipolar 1 disorder    Bladder calculus 04/19/2021   CHF (congestive heart failure) 02/01/2021   a.) TTE 02/01/2021: EF 40%, mild LVH, mild LAE, midl PR, mod TR, G2DD.   Chronic  anticoagulation 04/20/2021   Chronic insomnia 01/09/2018   Chronic low back pain without sciatica 04/11/2017   Chronic pain syndrome 04/13/2016   Chronic right hip pain 04/12/2018   Closed compression fracture of L1 vertebra 10/06/2016   Complicated urinary tract infection 04/01/2021   COPD (chronic obstructive pulmonary disease)    Dementia, unclear etiology 09/14/2021   Diverticulosis    Frequent PVCs    Generalized anxiety disorder    HFrEF (heart failure with reduced ejection fraction) 04/19/2021   Hiatal hernia    HTN (hypertension) 04/19/2021   Hyperlipidemia    Hypertrophy of prostate with urinary obstruction and other lower urinary tract symptoms (LUTS)    Infrarenal abdominal aortic aneurysm (AAA) without rupture 09/24/2018   a.) CTA CAP 09/24/2018: 4.2 x 4.2 cm; b.) CT abd abd 05/29/2019: 4.0 cm; c.) aorta duplex 12/30/2020: 4.14 x 4.08 x 4.19 cm; d.) CT abd/pel 02/21/2021: 4.6 cm; e.) CT renal 04/01/2021: 4.3 cm; f.) CT renal 04/19/2021: 4.6 cm   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   in hospital with sepsis   Long term current use of amiodarone    Long term current use of anticoagulant    a.) apixaban   Major depressive disorder    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   Nephrolithiasis 04/19/2021   Neuropathy of right foot    a.) s/p hip surgery   Osteopenia of multiple sites 01/10/2018   Osteoporosis    a.) on oral bisphosphonate   PAC (premature atrial  contraction) 08/05/2021   PAF (paroxysmal atrial fibrillation)    a.) holter 07/25/2020: NSR, PAF/flutter (14% burden) with longest lasting 45 minutes, freq PACs (24.6% burden); b.) holter 06/01/2021: NSR, episodes of NSVT, SVT (longest lasting 20 beats), freq PACs (29.3% burden), PVCs (2.6% burden), vent bi/trigeminy   RBBB (right bundle branch block)    Restless leg syndrome    a.) on ropinirole   Seborrhea 10/20/2018   Seborrheic keratoses 02/09/2019   Sepsis secondary to UTI 12/31/2020   Thrombocytopenia 12/31/2020    Urinary retention 04/19/2021   UTI (urinary tract infection) 04/19/2021   Wears glasses    Wears partial dentures    top and bottom partial    Past Surgical History:  Procedure Laterality Date   CYSTOSCOPY WITH LITHOLAPAXY N/A 05/03/2021   Procedure: CYSTOSCOPY WITH LITHOLAPAXY holmium laser;  Surgeon: Despina Arias, MD;  Location: [facility];  Service: Urology;  Laterality: N/A;   HIP ARTHROPLASTY Right 2005   HOLEP-LASER ENUCLEATION OF THE PROSTATE WITH MORCELLATION N/A 01/20/2022   Procedure: HOLEP-LASER ENUCLEATION OF THE PROSTATE WITH MORCELLATION;  Surgeon: Sondra Come, MD;  Location: [facility];  Service: Urology;  Laterality: N/A;   INGUINAL HERNIA REPAIR Bilateral 08/16/2012   Procedure: LAPAROSCOPIC BILATERAL INGUINAL HERNIA REPAIR;  Surgeon: Wilmon Arms. Corliss Skains, MD;  Location: [facility];  Service: General;  Laterality: Bilateral;   INSERTION OF MESH Bilateral 08/16/2012   Procedure: INSERTION OF MESH;  Surgeon: Wilmon Arms. Corliss Skains, MD;  Location: [facility];  Service: General;  Laterality: Bilateral;   MULTIPLE TOOTH EXTRACTIONS     TONSILLECTOMY     age 54   UPPER GI ENDOSCOPY      Allergies  Allergen Reactions   Gabapentin Other (See Comments)    crazy   Tylenol [Acetaminophen] Other (See Comments)    Hurts his kidneys    Outpatient Encounter Medications as of 01/04/2023  Medication Sig   amiodarone (PACERONE) 200 MG tablet Take 1 tablet (200 mg total) by mouth daily. Monday - Friday only   apixaban (ELIQUIS) 5 MG TABS tablet TAKE 1 TABLET BY MOUTH TWICE A DAY   cariprazine (VRAYLAR) 1.5 MG capsule Take 1.5 mg by mouth every other day.   ibandronate (BONIVA) 150 MG tablet Take 150 mg by mouth every 30 (thirty) days. 3rd of each month   memantine (NAMENDA) 5 MG tablet TAKE 1 TABLET (5 MG AT NIGHT) FOR 2 WEEKS, THEN INCREASE TO 1 TABLET (5 MG) TWICE A DAY   mirtazapine (REMERON) 15 MG tablet Take 15 mg by mouth at bedtime.   OLANZapine  (ZYPREXA) 5 MG tablet Take 5 mg by mouth at bedtime.   ondansetron (ZOFRAN) 4 MG tablet Take 1 tablet (4 mg total) by mouth every 6 (six) hours.   traMADol (ULTRAM) 50 MG tablet TAKE 1 TABLET (50 MG TOTAL) BY MOUTH EVERY 12 (TWELVE) HOURS AS NEEDED (PAIN).   traZODone (DESYREL) 100 MG tablet Take 0.5 tablets (50 mg total) by mouth at bedtime.   [DISCONTINUED] ELIQUIS 5 MG TABS tablet TAKE 1 TABLET BY MOUTH TWICE A DAY   No facility-administered encounter medications on file as of 01/04/2023.    Review of Systems:  Review of Systems  Constitutional:  Negative for activity change, appetite change and fever.  HENT:  Negative for sore throat.   Eyes: Negative.   Cardiovascular:  Negative for chest pain and leg swelling.  Gastrointestinal:  Negative for abdominal distention, diarrhea and vomiting.  Genitourinary:  Negative for  dysuria, frequency and urgency.  Skin:  Negative for color change.  Neurological:  Negative for dizziness and headaches.  Psychiatric/Behavioral:  Negative for behavioral problems and sleep disturbance. The patient is not nervous/anxious.     Health Maintenance  Topic Date Due   Pneumonia Vaccine 28+ Years old (1 of 2 - PCV) Never done   Zoster Vaccines- Shingrix (1 of 2) Never done   Colonoscopy  Never done   COVID-19 Vaccine (3 - Pfizer risk series) 04/22/2020   INFLUENZA VACCINE  Never done   Medicare Annual Wellness (AWV)  05/23/2023   DTaP/Tdap/Td (2 - Td or Tdap) 04/10/2024   Hepatitis C Screening  Completed   HPV VACCINES  Aged Out    Physical Exam: Vitals:   01/04/23 1058  BP: 121/78  Pulse: 64  Resp: 18  Temp: 98.1 F (36.7 C)  SpO2: 96%  Weight: 175 lb 12.8 oz (79.7 kg)  Height: 5\' 6"  (1.676 m)   Body mass index is 28.37 kg/m. Physical Exam Constitutional:      Appearance: Normal appearance.  HENT:     Head: Normocephalic and atraumatic.     Mouth/Throat:     Mouth: Mucous membranes are moist.  Eyes:     Conjunctiva/sclera:  Conjunctivae normal.  Cardiovascular:     Rate and Rhythm: Normal rate. Rhythm irregular.     Pulses: Normal pulses.     Heart sounds: Normal heart sounds.  Pulmonary:     Effort: Pulmonary effort is normal.     Breath sounds: Normal breath sounds.  Abdominal:     General: Bowel sounds are normal.     Palpations: Abdomen is soft.  Musculoskeletal:        General: No swelling. Normal range of motion.     Cervical back: Normal range of motion.  Skin:    General: Skin is warm and dry.  Neurological:     General: No focal deficit present.     Mental Status: He is alert and oriented to person, place, and time.  Psychiatric:        Mood and Affect: Mood normal.        Behavior: Behavior normal.        Thought Content: Thought content normal.        Judgment: Judgment normal.    Labs reviewed: Basic Metabolic Panel: Recent Labs    05/25/22 1115 08/09/22 1129 08/14/22 1209 10/02/22 1201  NA 143 140 142  --   K 3.8 3.3* 4.3  --   CL 106 107 108  --   CO2 23 23 26   --   GLUCOSE 81 112* 90  --   BUN 16 15 14   --   CREATININE 1.21 1.07 1.00  --   CALCIUM 9.0 9.0 9.0  --   TSH 0.308*  --   --  1.04   Liver Function Tests: Recent Labs    05/25/22 1115 08/09/22 1129  AST 17 47*  ALT 12 72*  ALKPHOS 75 74  BILITOT 0.4 1.2  PROT 6.1 7.3  ALBUMIN 4.0 3.6   Recent Labs    08/09/22 1129  LIPASE 45   No results for input(s): "AMMONIA" in the last 8760 hours. CBC: Recent Labs    03/25/22 1814 08/09/22 1129  WBC 10.5 8.6  NEUTROABS 7.6 7.3  HGB 13.3 14.3  HCT 40.8 42.9  MCV 86.1 88.5  PLT 128* 176   Lipid Panel: No results for input(s): "CHOL", "HDL", "LDLCALC", "TRIG", "CHOLHDL", "LDLDIRECT"  in the last 8760 hours. Lab Results  Component Value Date   HGBA1C 5.5 11/29/2021    Procedures since last visit: No results found.  Assessment/Plan  1. Bipolar 1 disorder (HCC) -  follows up with [facility] - cariprazine (VRAYLAR) 1.5 MG capsule; Take  1.5 mg by mouth every other day.  2. PAF (paroxysmal atrial fibrillation) (HCC) -  rate-controlled - continue Eliquis and Amiodarone  3. 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 Primary insomnia -  sleeps 6-7 hour/night -  continue Trazodone  5. Chronic midline low back pain without sciatica -  continue Tramadol PRN - traMADol (ULTRAM) 50 MG tablet; Take 1 tablet (50 mg total) by mouth every 12 (twelve) hours as needed (pain).  Dispense: 60 tablet; Refill: 0  6. Flu vaccine refused -  declined flu vaccine  7. Colon cancer screening -  denies bloody stool - Cologuard     Labs/tests ordered:   Cologuard  Next appt:  05/25/2023

## 2023-01-08 ENCOUNTER — Other Ambulatory Visit: Payer: Self-pay | Admitting: Student

## 2023-01-22 DIAGNOSIS — H43813 Vitreous degeneration, bilateral: Secondary | ICD-10-CM | POA: Diagnosis not present

## 2023-01-22 DIAGNOSIS — H2513 Age-related nuclear cataract, bilateral: Secondary | ICD-10-CM | POA: Diagnosis not present

## 2023-01-22 DIAGNOSIS — H353131 Nonexudative age-related macular degeneration, bilateral, early dry stage: Secondary | ICD-10-CM | POA: Diagnosis not present

## 2023-02-12 ENCOUNTER — Other Ambulatory Visit: Payer: Self-pay | Admitting: Adult Health

## 2023-02-12 DIAGNOSIS — M545 Low back pain, unspecified: Secondary | ICD-10-CM

## 2023-02-12 NOTE — Telephone Encounter (Signed)
Patient is requesting a refill of the following medications: Requested Prescriptions   Pending Prescriptions Disp Refills   traMADol (ULTRAM) 50 MG tablet [Pharmacy Med Name: TRAMADOL HCL 50 MG TABLET] 60 tablet 0    Sig: TAKE 1 TABLET (50 MG TOTAL) BY MOUTH EVERY 12 (TWELVE) HOURS AS NEEDED (PAIN).    Date of last refill: 01/04/23  Refill amount: 0  Treatment agreement date: 01/04/23

## 2023-02-15 ENCOUNTER — Encounter: Payer: Self-pay | Admitting: Adult Health

## 2023-02-19 ENCOUNTER — Encounter: Payer: Self-pay | Admitting: Adult Health

## 2023-02-19 ENCOUNTER — Ambulatory Visit (INDEPENDENT_AMBULATORY_CARE_PROVIDER_SITE_OTHER): Payer: 59 | Admitting: Adult Health

## 2023-02-19 VITALS — BP 122/78 | HR 61 | Temp 98.9°F | Resp 18 | Ht 66.0 in | Wt 180.8 lb

## 2023-02-19 DIAGNOSIS — H6123 Impacted cerumen, bilateral: Secondary | ICD-10-CM | POA: Diagnosis not present

## 2023-02-19 DIAGNOSIS — F319 Bipolar disorder, unspecified: Secondary | ICD-10-CM | POA: Diagnosis not present

## 2023-02-19 DIAGNOSIS — F5101 Primary insomnia: Secondary | ICD-10-CM

## 2023-02-19 DIAGNOSIS — I480 Paroxysmal atrial fibrillation: Secondary | ICD-10-CM

## 2023-02-19 MED ORDER — DEBROX 6.5 % OT SOLN
5.0000 [drp] | Freq: Two times a day (BID) | OTIC | 0 refills | Status: AC
Start: 2023-02-19 — End: 2023-02-23

## 2023-02-19 NOTE — Patient Instructions (Signed)
 Earwax Buildup, Adult Your ears make something called earwax. It helps keep germs called bacteria away and protects the skin in your ears. Sometimes, too much earwax can build up. This can cause discomfort or make it harder to hear. What are the causes? Earwax buildup can happen when you have too much earwax in your ears. Earwax is made in the outer part of your ear canal. It's supposed to fall out in small amounts over time. But if your ears aren't able to clean themselves like they should, earwax can build up. What increases the risk? You're more likely to get earwax buildup if: You clean your ears with cotton swabs. You pick at your ears. You use earplugs or in-ear headphones a lot. You wear hearing aids. You may also be more likely to get it if: You're male. You're older. Your ears naturally make more earwax. You have narrow ear canals or extra hair in your ears. Your earwax is too thick or sticky. You have eczema. You're dehydrated. This means there's not enough fluid in your body. What are the signs or symptoms? Symptoms of earwax buildup include: Not being able to hear as well. A feeling of fullness in your ear. Feeling like your ear is plugged. Fluid coming from your ear. Ear pain or an itchy ear. Ringing in your ear. Coughing or problems with balance. How is this diagnosed? Earwax buildup may be diagnosed based on your symptoms, medical history, and an ear exam. During the exam, your health care provider will look into your ear with a tool called an otoscope. You may also have tests, such as a hearing test. How is this treated? Earwax buildup may be treated by: Using ear drops. Having the earwax removed by a provider. The provider may: Flush the ear with water. Use a tool called a curette that has a loop on the end. Use a suction device. Having surgery. This may be done in severe cases. Follow these instructions at home:  Cleaning your ears Clean your ears as told  by your provider. You can clean the outside of your ears with a washcloth or tissue. Do not overclean your ears. Do not put anything into your ear unless told. This includes cotton swabs. General instructions Take over-the-counter and prescription medicines only as told by your provider. Drink enough fluid to keep your pee (urine) pale yellow. This helps thin the earwax. If you have hearing aids, clean them as told. Keep all follow-up visits. If earwax builds up in your ears often or if you use hearing aids, ask your provider how often you should have your ears cleaned. Contact a health care provider if: Your ear pain gets worse. You have a fever. You have pus, blood, or other fluid coming from your ear. You have hearing loss. You have ringing in your ears that won't go away. You feel like the room is spinning. This is called vertigo. Your symptoms don't get better with treatment. This information is not intended to replace advice given to you by your health care provider. Make sure you discuss any questions you have with your health care provider. Document Revised: 05/11/2022 Document Reviewed: 05/11/2022 Elsevier Patient Education  2024 [employer].

## 2023-02-19 NOTE — Progress Notes (Signed)
[facility] clinic  Provider:  Kenard Gower DNP  Code Status:   Full Code  Goals of Care:     01/04/2023   11:38 AM  Advanced Directives  Does Patient Have a Medical Advance Directive? No  Would patient like information on creating a medical advance directive? No - Patient declined     Chief Complaint  Patient presents with   Ear Pain    Ringing in ears    HPI: Patient is a 72 y.o. male seen today for an acute visit for ringing of his  ears. He was accompanied by his wife.  He stated that he has on and off ringing of his ears, at bedtime a lot and "wants to scrape them".  This has been going on and of for the last 2 months.  Ringing is now progressing.  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 CTA abd/pelvis on 09/24/18 showed 4.2 x 4.2 cm.   Both times, the radiologist recommended follow up with ultrasound in 1 year. Records in Care Everywhere F.W.   Aortic atherosclerosis    Benign nevus of skin 02/17/2019   Benign prostatic hyperplasia with weak urinary stream 04/13/2016   Bilateral inguinal hernia 07/23/2012   Bipolar 1 disorder    Bladder calculus 04/19/2021   CHF (congestive heart failure) 02/01/2021   a.) TTE 02/01/2021: EF 40%, mild LVH, mild LAE, midl PR, mod TR, G2DD.   Chronic anticoagulation 04/20/2021   Chronic insomnia 01/09/2018   Chronic low  back pain without sciatica 04/11/2017   Chronic pain syndrome 04/13/2016   Chronic right hip pain 04/12/2018   Closed compression fracture of L1 vertebra 10/06/2016   Complicated urinary tract infection 04/01/2021   COPD (chronic obstructive pulmonary disease)    Dementia, unclear etiology 09/14/2021   Diverticulosis    Frequent PVCs    Generalized anxiety disorder    HFrEF (heart failure with reduced ejection fraction) 04/19/2021   Hiatal hernia    HTN (hypertension) 04/19/2021   Hyperlipidemia    Hypertrophy of prostate with urinary obstruction and other lower urinary tract symptoms (LUTS)    Infrarenal abdominal aortic aneurysm (AAA) without rupture 09/24/2018   a.) CTA CAP 09/24/2018: 4.2 x 4.2 cm; b.) CT abd abd 05/29/2019: 4.0 cm; c.) aorta duplex 12/30/2020: 4.14 x 4.08 x 4.19 cm; d.) CT abd/pel 02/21/2021: 4.6 cm; e.) CT renal 04/01/2021: 4.3 cm; f.) CT renal 04/19/2021: 4.6 cm   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   in hospital with sepsis   Long term current use of amiodarone    Long term current use of anticoagulant    a.) apixaban   Major depressive disorder    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   Nephrolithiasis 04/19/2021   Neuropathy of right foot    a.) s/p hip surgery   Osteopenia of multiple sites 01/10/2018   Osteoporosis    a.) on oral bisphosphonate  PAC (premature atrial contraction) 08/05/2021   PAF (paroxysmal atrial fibrillation)    a.) holter 07/25/2020: NSR, PAF/flutter (14% burden) with longest lasting 45 minutes, freq PACs (24.6% burden); b.) holter 06/01/2021: NSR, episodes of NSVT, SVT (longest lasting 20 beats), freq PACs (29.3% burden), PVCs (2.6% burden), vent bi/trigeminy   RBBB (right bundle branch block)    Restless leg syndrome    a.) on ropinirole   Seborrhea 10/20/2018   Seborrheic keratoses 02/09/2019   Sepsis secondary to UTI 12/31/2020   Thrombocytopenia 12/31/2020   Urinary retention 04/19/2021   UTI (urinary tract infection) 04/19/2021    Wears glasses    Wears partial dentures    top and bottom partial    Past Surgical History:  Procedure Laterality Date   CYSTOSCOPY WITH LITHOLAPAXY N/A 05/03/2021   Procedure: CYSTOSCOPY WITH LITHOLAPAXY holmium laser;  Surgeon: Despina Arias, MD;  Location: [facility];  Service: Urology;  Laterality: N/A;   HIP ARTHROPLASTY Right 2005   HOLEP-LASER ENUCLEATION OF THE PROSTATE WITH MORCELLATION N/A 01/20/2022   Procedure: HOLEP-LASER ENUCLEATION OF THE PROSTATE WITH MORCELLATION;  Surgeon: Sondra Come, MD;  Location: [facility];  Service: Urology;  Laterality: N/A;   INGUINAL HERNIA REPAIR Bilateral 08/16/2012   Procedure: LAPAROSCOPIC BILATERAL INGUINAL HERNIA REPAIR;  Surgeon: Wilmon Arms. Corliss Skains, MD;  Location: [facility];  Service: General;  Laterality: Bilateral;   INSERTION OF MESH Bilateral 08/16/2012   Procedure: INSERTION OF MESH;  Surgeon: Wilmon Arms. Corliss Skains, MD;  Location: [facility];  Service: General;  Laterality: Bilateral;   MULTIPLE TOOTH EXTRACTIONS     TONSILLECTOMY     age 46   UPPER GI ENDOSCOPY      Allergies  Allergen Reactions   Gabapentin Other (See Comments)    crazy   Tylenol [Acetaminophen] Other (See Comments)    Hurts his kidneys    Outpatient Encounter Medications as of 02/19/2023  Medication Sig   amiodarone (PACERONE) 200 MG tablet TAKE 1 TABLET (200 MG TOTAL) BY MOUTH DAILY. MONDAY - FRIDAY ONLY   apixaban (ELIQUIS) 5 MG TABS tablet TAKE 1 TABLET BY MOUTH TWICE A DAY   cariprazine (VRAYLAR) 1.5 MG capsule Take 1.5 mg by mouth every other day.   ibandronate (BONIVA) 150 MG tablet Take 150 mg by mouth every 30 (thirty) days. 3rd of each month   memantine (NAMENDA) 5 MG tablet TAKE 1 TABLET (5 MG AT NIGHT) FOR 2 WEEKS, THEN INCREASE TO 1 TABLET (5 MG) TWICE A DAY   mirtazapine (REMERON) 15 MG tablet Take 15 mg by mouth at bedtime.   ondansetron (ZOFRAN) 4 MG tablet Take 1 tablet (4 mg total) by mouth every 6 (six) hours.    traMADol (ULTRAM) 50 MG tablet TAKE 1 TABLET (50 MG TOTAL) BY MOUTH EVERY 12 (TWELVE) HOURS AS NEEDED (PAIN).   traZODone (DESYREL) 100 MG tablet Take 0.5 tablets (50 mg total) by mouth at bedtime.   No facility-administered encounter medications on file as of 02/19/2023.    Review of Systems:  Review of Systems  Constitutional:  Negative for activity change, appetite change and fever.  HENT:  Negative for sore throat.   Eyes: Negative.   Cardiovascular:  Negative for chest pain and leg swelling.  Gastrointestinal:  Negative for abdominal distention, diarrhea and vomiting.  Genitourinary:  Negative for dysuria, frequency and urgency.  Skin:  Negative for color change.  Neurological:  Negative for dizziness and headaches.  Psychiatric/Behavioral:  Negative for behavioral problems and  sleep disturbance. The patient is not nervous/anxious.        Low energy    Health Maintenance  Topic Date Due   Pneumonia Vaccine 30+ Years old (1 of 2 - PCV) Never done   Zoster Vaccines- Shingrix (1 of 2) Never done   Colonoscopy  Never done   COVID-19 Vaccine (3 - Pfizer risk series) 04/22/2020   INFLUENZA VACCINE  06/11/2023 (Originally 10/12/2022)   Medicare Annual Wellness (AWV)  05/23/2023   DTaP/Tdap/Td (2 - Td or Tdap) 04/10/2024   Hepatitis C Screening  Completed   HPV VACCINES  Aged Out    Physical Exam: Vitals:   02/19/23 1331  BP: 122/78  Pulse: 61  Resp: 18  Temp: 98.9 F (37.2 C)  SpO2: 97%  Weight: 180 lb 12.8 oz (82 kg)  Height: 5\' 6"  (1.676 m)   Body mass index is 29.18 kg/m. Physical Exam Constitutional:      Appearance: Normal appearance.  HENT:     Head: Normocephalic and atraumatic.     Right Ear: There is impacted cerumen.     Left Ear: There is impacted cerumen.     Ears:     Comments: Bilateral ears with moderate cerumen, L > R    Mouth/Throat:     Mouth: Mucous membranes are moist.  Eyes:     Conjunctiva/sclera: Conjunctivae normal.  Cardiovascular:      Rate and Rhythm: Normal rate and regular rhythm.     Pulses: Normal pulses.     Heart sounds: Normal heart sounds.  Pulmonary:     Effort: Pulmonary effort is normal.     Breath sounds: Normal breath sounds.  Abdominal:     General: Bowel sounds are normal.     Palpations: Abdomen is soft.  Musculoskeletal:        General: No swelling. Normal range of motion.     Cervical back: Normal range of motion.  Skin:    General: Skin is warm and dry.  Neurological:     General: No focal deficit present.     Mental Status: He is alert and oriented to person, place, and time.  Psychiatric:        Mood and Affect: Mood normal.        Behavior: Behavior normal.     Labs reviewed: Basic Metabolic Panel: Recent Labs    05/25/22 1115 08/09/22 1129 08/14/22 1209 10/02/22 1201  NA 143 140 142  --   K 3.8 3.3* 4.3  --   CL 106 107 108  --   CO2 23 23 26   --   GLUCOSE 81 112* 90  --   BUN 16 15 14   --   CREATININE 1.21 1.07 1.00  --   CALCIUM 9.0 9.0 9.0  --   TSH 0.308*  --   --  1.04   Liver Function Tests: Recent Labs    05/25/22 1115 08/09/22 1129  AST 17 47*  ALT 12 72*  ALKPHOS 75 74  BILITOT 0.4 1.2  PROT 6.1 7.3  ALBUMIN 4.0 3.6   Recent Labs    08/09/22 1129  LIPASE 45   No results for input(s): "AMMONIA" in the last 8760 hours. CBC: Recent Labs    03/25/22 1814 08/09/22 1129  WBC 10.5 8.6  NEUTROABS 7.6 7.3  HGB 13.3 14.3  HCT 40.8 42.9  MCV 86.1 88.5  PLT 128* 176   Lipid Panel: No results for input(s): "CHOL", "HDL", "LDLCALC", "TRIG", "CHOLHDL", "LDLDIRECT" in  the last 8760 hours. Lab Results  Component Value Date   HGBA1C 5.5 11/29/2021    Procedures since last visit: No results found.  Assessment/Plan  1. Bilateral impacted cerumen (Primary) - carbamide peroxide (DEBROX) 6.5 % OTIC solution; Place 5 drops into both ears 2 (two) times daily for 4 days.  Dispense: 2 mL; Refill: 0 -  follow up in clinic on 5th day for ear lavage  2.  Primary insomnia -  sleeps well -  continue Trazodone and Remeron  3. Bipolar 1 disorder (HCC) -  has low energy -  continue Vraylar -  follows up with psych  4. PAF (paroxysmal atrial fibrillation) (HCC) -  rate-controlled -  continue Eliquis and Amiodarone    Labs/tests ordered:  None  Next appt:  05/25/2023

## 2023-02-21 ENCOUNTER — Other Ambulatory Visit: Payer: Self-pay

## 2023-02-21 MED ORDER — IBANDRONATE SODIUM 150 MG PO TABS: 150.0000 mg | ORAL_TABLET | ORAL | 3 refills | Status: DC

## 2023-02-26 ENCOUNTER — Encounter: Payer: Self-pay | Admitting: Adult Health

## 2023-02-26 ENCOUNTER — Ambulatory Visit (INDEPENDENT_AMBULATORY_CARE_PROVIDER_SITE_OTHER): Payer: 59 | Admitting: Adult Health

## 2023-02-26 VITALS — BP 121/78 | HR 63 | Temp 97.6°F | Resp 18 | Ht 66.0 in | Wt 180.8 lb

## 2023-02-26 DIAGNOSIS — H6123 Impacted cerumen, bilateral: Secondary | ICD-10-CM

## 2023-02-26 DIAGNOSIS — M8589 Other specified disorders of bone density and structure, multiple sites: Secondary | ICD-10-CM

## 2023-02-26 DIAGNOSIS — H9313 Tinnitus, bilateral: Secondary | ICD-10-CM | POA: Diagnosis not present

## 2023-02-26 MED ORDER — IBANDRONATE SODIUM 150 MG PO TABS: 150.0000 mg | ORAL_TABLET | ORAL | 1 refills | Status: DC

## 2023-02-26 NOTE — Progress Notes (Signed)
[facility] clinic  Provider:  Kenard Gower DNP  Code Status:  Full Code  Goals of Care:     01/04/2023   11:38 AM  Advanced Directives  Does Patient Have a Medical Advance Directive? No  Would patient like information on creating a medical advance directive? No - Patient declined     Chief Complaint  Patient presents with   Medical Management of Chronic Issues     Check on ears after using drops     HPI: Patient is a 72 y.o. male seen today for an acute visit for ear lavage. He was accompanied by his wife today.  He completed Debrox otic drops X 4 days. He stated that his ringing has improved.  Ear lavage done and obtained moderate amount of cerumen from both ears.  Wife requested for Boniva refill for his osteopenia. No recent fractures.  Past Medical History:  Diagnosis Date   Acute gastritis without hemorrhage 11/09/2015   Acute lower UTI 06/13/2017   Acute metabolic encephalopathy 06/13/2017   Aneurysm of infrarenal abdominal aorta 06/13/2017   Infrarenal abdominal aortic aneurysm first noted on MRI of lumbar spine on 06/13/2017 measuring maximum diameter of 4.2 cm. CTA abd/pelvis on 09/24/18 showed 4.2 x 4.2 cm.   Both times, the radiologist recommended follow up with ultrasound in 1 year. Records in Care Everywhere F.W.   Aortic atherosclerosis    Benign nevus of skin 02/17/2019   Benign prostatic hyperplasia with weak urinary stream 04/13/2016   Bilateral inguinal hernia 07/23/2012   Bipolar 1 disorder    Bladder calculus 04/19/2021   CHF (congestive heart failure) 02/01/2021   a.) TTE 02/01/2021: EF 40%, mild LVH, mild LAE, midl PR, mod TR, G2DD.   Chronic anticoagulation 04/20/2021   Chronic insomnia 01/09/2018   Chronic low back pain without sciatica 04/11/2017   Chronic pain syndrome 04/13/2016   Chronic right hip pain 04/12/2018   Closed compression fracture of L1 vertebra 10/06/2016   Complicated urinary tract infection 04/01/2021   COPD (chronic  obstructive pulmonary disease)    Dementia, unclear etiology 09/14/2021   Diverticulosis    Frequent PVCs    Generalized anxiety disorder    HFrEF (heart failure with reduced ejection fraction) 04/19/2021   Hiatal hernia    HTN (hypertension) 04/19/2021   Hyperlipidemia    Hypertrophy of prostate with urinary obstruction and other lower urinary tract symptoms (LUTS)    Infrarenal abdominal aortic aneurysm (AAA) without rupture 09/24/2018   a.) CTA CAP 09/24/2018: 4.2 x 4.2 cm; b.) CT abd abd 05/29/2019: 4.0 cm; c.) aorta duplex 12/30/2020: 4.14 x 4.08 x 4.19 cm; d.) CT abd/pel 02/21/2021: 4.6 cm; e.) CT renal 04/01/2021: 4.3 cm; f.) CT renal 04/19/2021: 4.6 cm   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   in hospital with sepsis   Long term current use of amiodarone    Long term current use of anticoagulant    a.) apixaban   Major depressive disorder    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   Nephrolithiasis 04/19/2021   Neuropathy of right foot    a.) s/p hip surgery   Osteopenia of multiple sites 01/10/2018   Osteoporosis    a.) on oral bisphosphonate   PAC (premature atrial contraction) 08/05/2021   PAF (paroxysmal atrial fibrillation)    a.) holter 07/25/2020: NSR, PAF/flutter (14% burden) with longest lasting 45 minutes, freq PACs (24.6% burden); b.) holter 06/01/2021: NSR, episodes of NSVT, SVT (longest lasting 20 beats), freq PACs (29.3% burden), PVCs (2.6% burden), vent  bi/trigeminy   RBBB (right bundle branch block)    Restless leg syndrome    a.) on ropinirole   Seborrhea 10/20/2018   Seborrheic keratoses 02/09/2019   Sepsis secondary to UTI 12/31/2020   Thrombocytopenia 12/31/2020   Urinary retention 04/19/2021   UTI (urinary tract infection) 04/19/2021   Wears glasses    Wears partial dentures    top and bottom partial    Past Surgical History:  Procedure Laterality Date   CYSTOSCOPY WITH LITHOLAPAXY N/A 05/03/2021   Procedure: CYSTOSCOPY WITH LITHOLAPAXY holmium laser;   Surgeon: Despina Arias, MD;  Location: [facility];  Service: Urology;  Laterality: N/A;   HIP ARTHROPLASTY Right 2005   HOLEP-LASER ENUCLEATION OF THE PROSTATE WITH MORCELLATION N/A 01/20/2022   Procedure: HOLEP-LASER ENUCLEATION OF THE PROSTATE WITH MORCELLATION;  Surgeon: Sondra Come, MD;  Location: [facility];  Service: Urology;  Laterality: N/A;   INGUINAL HERNIA REPAIR Bilateral 08/16/2012   Procedure: LAPAROSCOPIC BILATERAL INGUINAL HERNIA REPAIR;  Surgeon: Wilmon Arms. Corliss Skains, MD;  Location: [facility];  Service: General;  Laterality: Bilateral;   INSERTION OF MESH Bilateral 08/16/2012   Procedure: INSERTION OF MESH;  Surgeon: Wilmon Arms. Corliss Skains, MD;  Location: [facility];  Service: General;  Laterality: Bilateral;   MULTIPLE TOOTH EXTRACTIONS     TONSILLECTOMY     age 25   UPPER GI ENDOSCOPY      Allergies  Allergen Reactions   Gabapentin Other (See Comments)    crazy   Tylenol [Acetaminophen] Other (See Comments)    Hurts his kidneys    Outpatient Encounter Medications as of 02/26/2023  Medication Sig   amiodarone (PACERONE) 200 MG tablet TAKE 1 TABLET (200 MG TOTAL) BY MOUTH DAILY. MONDAY - FRIDAY ONLY   apixaban (ELIQUIS) 5 MG TABS tablet TAKE 1 TABLET BY MOUTH TWICE A DAY   cariprazine (VRAYLAR) 1.5 MG capsule Take 1.5 mg by mouth every other day.   ibandronate (BONIVA) 150 MG tablet Take 1 tablet (150 mg total) by mouth every 30 (thirty) days. 3rd of each month   memantine (NAMENDA) 5 MG tablet TAKE 1 TABLET (5 MG AT NIGHT) FOR 2 WEEKS, THEN INCREASE TO 1 TABLET (5 MG) TWICE A DAY   mirtazapine (REMERON) 15 MG tablet Take 15 mg by mouth at bedtime.   ondansetron (ZOFRAN) 4 MG tablet Take 1 tablet (4 mg total) by mouth every 6 (six) hours.   traMADol (ULTRAM) 50 MG tablet TAKE 1 TABLET (50 MG TOTAL) BY MOUTH EVERY 12 (TWELVE) HOURS AS NEEDED (PAIN).   traZODone (DESYREL) 100 MG tablet Take 0.5 tablets (50 mg total) by mouth at bedtime.   No  facility-administered encounter medications on file as of 02/26/2023.    Review of Systems:  Review of Systems  Constitutional:  Negative for activity change, appetite change and fever.  HENT:  Negative for sore throat.   Eyes: Negative.   Cardiovascular:  Negative for chest pain and leg swelling.  Gastrointestinal:  Negative for abdominal distention, diarrhea and vomiting.  Genitourinary:  Negative for dysuria, frequency and urgency.  Skin:  Negative for color change.  Neurological:  Negative for dizziness and headaches.  Psychiatric/Behavioral:  Negative for behavioral problems and sleep disturbance. The patient is not nervous/anxious.     Health Maintenance  Topic Date Due   Colonoscopy  Never done   COVID-19 Vaccine (3 - Pfizer risk series) 03/07/2023 (Originally 04/22/2020)   Zoster Vaccines- Shingrix (1 of 2) 05/20/2023 (Originally 12/25/1969)  INFLUENZA VACCINE  06/11/2023 (Originally 10/12/2022)   Pneumonia Vaccine 55+ Years old (1 of 2 - PCV) 02/19/2024 (Originally 12/25/1956)   Medicare Annual Wellness (AWV)  05/23/2023   DTaP/Tdap/Td (2 - Td or Tdap) 04/10/2024   Hepatitis C Screening  Completed   HPV VACCINES  Aged Out    Physical Exam: Vitals:   02/26/23 0941  BP: 121/78  Pulse: 63  Resp: 18  Temp: 97.6 F (36.4 C)  SpO2: 97%  Weight: 180 lb 12.8 oz (82 kg)  Height: 5\' 6"  (1.676 m)   Body mass index is 29.18 kg/m. Physical Exam Constitutional:      Appearance: Normal appearance.  HENT:     Head: Normocephalic and atraumatic.     Mouth/Throat:     Mouth: Mucous membranes are moist.  Eyes:     Conjunctiva/sclera: Conjunctivae normal.  Cardiovascular:     Rate and Rhythm: Normal rate. Rhythm irregular.     Pulses: Normal pulses.     Heart sounds: Normal heart sounds.  Pulmonary:     Effort: Pulmonary effort is normal.     Breath sounds: Normal breath sounds.  Abdominal:     General: Bowel sounds are normal.     Palpations: Abdomen is soft.   Musculoskeletal:        General: No swelling. Normal range of motion.     Cervical back: Normal range of motion.  Skin:    General: Skin is warm and dry.  Neurological:     General: No focal deficit present.     Mental Status: He is alert and oriented to person, place, and time.  Psychiatric:        Mood and Affect: Mood normal.        Behavior: Behavior normal.        Thought Content: Thought content normal.        Judgment: Judgment normal.     Labs reviewed: Basic Metabolic Panel: Recent Labs    05/25/22 1115 08/09/22 1129 08/14/22 1209 10/02/22 1201  NA 143 140 142  --   K 3.8 3.3* 4.3  --   CL 106 107 108  --   CO2 23 23 26   --   GLUCOSE 81 112* 90  --   BUN 16 15 14   --   CREATININE 1.21 1.07 1.00  --   CALCIUM 9.0 9.0 9.0  --   TSH 0.308*  --   --  1.04   Liver Function Tests: Recent Labs    05/25/22 1115 08/09/22 1129  AST 17 47*  ALT 12 72*  ALKPHOS 75 74  BILITOT 0.4 1.2  PROT 6.1 7.3  ALBUMIN 4.0 3.6   Recent Labs    08/09/22 1129  LIPASE 45   No results for input(s): "AMMONIA" in the last 8760 hours. CBC: Recent Labs    03/25/22 1814 08/09/22 1129  WBC 10.5 8.6  NEUTROABS 7.6 7.3  HGB 13.3 14.3  HCT 40.8 42.9  MCV 86.1 88.5  PLT 128* 176   Lipid Panel: No results for input(s): "CHOL", "HDL", "LDLCALC", "TRIG", "CHOLHDL", "LDLDIRECT" in the last 8760 hours. Lab Results  Component Value Date   HGBA1C 5.5 11/29/2021    Procedures since last visit: No results found.  Assessment/Plan  1. Bilateral impacted cerumen (Primary) -  ear lavage done and obtained moderate amount of cerumen -  he denies pain and verbalized improvement in hearing  2. Tinnitus of both ears -  resolved  3. Osteopenia of multiple  sites -   fall precautions - ibandronate (BONIVA) 150 MG tablet; Take 1 tablet (150 mg total) by mouth every 30 (thirty) days. 3rd of each month  Dispense: 3 tablet; Refill: 1   Labs/tests ordered:  None   Next appt:   05/25/2023

## 2023-02-26 NOTE — Patient Instructions (Signed)
Ear Irrigation Ear irrigation is a procedure to wash dirt and wax out of your ear canal. It's also called lavage. You may need this if you're having trouble hearing because of wax in your ear. You may also have it done as part of the treatment for an ear infection.  Getting wax and dirt out of your ear can help ear drops work better. Tell a health care provider about: Any allergies you have. All medicines you're taking, including vitamins, herbs, eye drops, creams, and over-the-counter medicines. Any problems you or family members have had with anesthesia. Any bleeding problems you have. Any surgeries you've had. This includes any ear surgeries. Any medical conditions you have, such as any problems with your ear. Whether you're pregnant or may be pregnant. What are the risks? Your health care provider will talk with you about risks. These may include: Infection. Pain. Loss of hearing. Fluid and debris being pushed into your middle ear. This can happen if there are holes in your eardrum. The procedure not working. Trauma to your ear. Feeling dizzy, light-headed, or nauseous. What happens before the procedure? You'll talk with your provider about the procedure and plan. You may be given ear drops to put in your ear 15-20 minutes before the procedure. This helps loosen the wax. What happens during the procedure?  A syringe will be filled with water or a saline solution. Saline is made of salt and water. The syringe will be gently put into your ear. The fluid will be used to wash out wax and other debris. The procedure may vary among providers and hospitals. What can I expect after the procedure? Follow the instructions given to you by your provider. Follow these instructions at home: Using ear irrigation kits In some cases, you can use an ear irrigation kit at home. Ask your provider if this is an option for you. Use the kit only as told by your provider. Read the instructions on the  package. Follow the directions for using the syringe. Use water that's at room temperature. Do not use an ear irrigation kit if: You have diabetes. This can make you more likely to get an infection. You have a hole or tear in your eardrum. You have tubes in your ears. You've had ear surgery before. You've been told not to irrigate your ears. Cleaning your ears  Clean the outside of your ear with a soft washcloth each day. If told by your provider, use a few drops of baby oil, mineral oil, glycerin, hydrogen peroxide, or earwax softening drops. Do not use cotton swabs to clean your ears. These can push wax down into the ear canal. Do not put things into your ears to try to get rid of wax. This includes ear candles. General instructions Take over-the-counter and prescription medicines only as told by your provider. If you were prescribed antibiotics, use them as told by your provider. Do not stop using the antibiotic even if you start to feel better. Keep your ear clean and dry as told by your provider. See your provider at least once a year to have your ears and hearing checked. Contact a health care provider if: Your hearing isn't getting better. Your hearing is getting worse. You have pain or redness in your ear. You feel dizzy. You have ringing in your ears. You have nausea or vomiting. This information is not intended to replace advice given to you by your health care provider. Make sure you discuss any questions you have with  your health care provider. Document Revised: 05/11/2022 Document Reviewed: 05/11/2022 Elsevier Patient Education  2024 [employer].

## 2023-03-23 ENCOUNTER — Other Ambulatory Visit: Payer: Self-pay | Admitting: Adult Health

## 2023-03-23 DIAGNOSIS — G8929 Other chronic pain: Secondary | ICD-10-CM

## 2023-03-23 NOTE — Telephone Encounter (Signed)
 Pharmacy requested refill Epic LR: 02/12/2023 Contract Date: 01/04/2023  Pended Rx and sent to [facility] for approval.

## 2023-05-09 ENCOUNTER — Other Ambulatory Visit: Payer: Self-pay | Admitting: Family

## 2023-05-09 DIAGNOSIS — M545 Low back pain, unspecified: Secondary | ICD-10-CM

## 2023-05-10 NOTE — Telephone Encounter (Signed)
 Patient is requesting a refill of the following medications: Requested Prescriptions   Pending Prescriptions Disp Refills   traMADol (ULTRAM) 50 MG tablet [Pharmacy Med Name: TRAMADOL HCL 50 MG TABLET] 60 tablet 0    Sig: TAKE 1 TABLET (50 MG TOTAL) BY MOUTH EVERY 12 (TWELVE) HOURS AS NEEDED (PAIN).    Date of last refill:03/23/2023  Refill amount: 60 tablets   Treatment agreement date: 01/04/2023

## 2023-05-25 ENCOUNTER — Encounter: Payer: Self-pay | Admitting: Nurse Practitioner

## 2023-05-25 ENCOUNTER — Ambulatory Visit: Payer: 59 | Admitting: Nurse Practitioner

## 2023-05-25 VITALS — BP 116/78 | HR 70 | Temp 97.0°F | Resp 18 | Ht 66.0 in | Wt 172.4 lb

## 2023-05-25 DIAGNOSIS — F17200 Nicotine dependence, unspecified, uncomplicated: Secondary | ICD-10-CM | POA: Diagnosis not present

## 2023-05-25 DIAGNOSIS — Z0000 Encounter for general adult medical examination without abnormal findings: Secondary | ICD-10-CM

## 2023-05-25 NOTE — Patient Instructions (Signed)
  Carlos Short , Thank you for taking time to come for your Medicare Wellness Visit. I appreciate your ongoing commitment to your health goals. Please review the following plan we discussed and let me know if I can assist you in the future.   Shingles at local pharmacy and to complete COLOGUARD at home   This is a list of the screening recommended for you and due dates:  Health Maintenance  Topic Date Due   Zoster (Shingles) Vaccine (1 of 2) Never done   Colon Cancer Screening  Never done   Flu Shot  06/11/2023*   Pneumonia Vaccine (1 of 2 - PCV) 02/19/2024*   COVID-19 Vaccine (3 - Pfizer risk series) 05/24/2024*   DTaP/Tdap/Td vaccine (2 - Td or Tdap) 04/10/2024   Medicare Annual Wellness Visit  05/24/2024   Hepatitis C Screening  Completed   HPV Vaccine  Aged Out  *Topic was postponed. The date shown is not the original due date.

## 2023-05-25 NOTE — Progress Notes (Signed)
 Subjective:   Carlos Short. is a 73 y.o. male who presents for Medicare Annual/Subsequent preventive examination.  Visit Complete: In person   Cardiac Risk Factors include: advanced age (>76men, >58 women);sedentary lifestyle;family history of premature cardiovascular disease;smoking/ tobacco exposure     Objective:    Today's Vitals   05/25/23 1052  BP: 116/78  Pulse: 70  Resp: 18  Temp: (!) 97 F (36.1 C)  SpO2: 97%  Weight: 172 lb 6.4 oz (78.2 kg)  Height: 5\' 6"  (1.676 m)   Body mass index is 27.83 kg/m.     01/04/2023   11:38 AM 10/02/2022   11:12 AM 08/14/2022    8:23 AM 08/09/2022   10:18 AM 06/07/2022   10:56 AM 03/25/2022    5:10 PM 02/27/2022    9:04 AM  Advanced Directives  Does Patient Have a Medical Advance Directive? No No No No No No No  Would patient like information on creating a medical advance directive? No - Patient declined No - Patient declined No - Patient declined No - Patient declined No - Patient declined No - Patient declined No - Patient declined    Current Medications (verified) Outpatient Encounter Medications as of 05/25/2023  Medication Sig   amiodarone (PACERONE) 200 MG tablet TAKE 1 TABLET (200 MG TOTAL) BY MOUTH DAILY. MONDAY - FRIDAY ONLY   apixaban (ELIQUIS) 5 MG TABS tablet TAKE 1 TABLET BY MOUTH TWICE A DAY   cariprazine (VRAYLAR) 1.5 MG capsule Take 1.5 mg by mouth every other day.   ibandronate (BONIVA) 150 MG tablet Take 1 tablet (150 mg total) by mouth every 30 (thirty) days. 3rd of each month   memantine (NAMENDA) 5 MG tablet TAKE 1 TABLET (5 MG AT NIGHT) FOR 2 WEEKS, THEN INCREASE TO 1 TABLET (5 MG) TWICE A DAY   mirtazapine (REMERON) 15 MG tablet Take 15 mg by mouth at bedtime.   ondansetron (ZOFRAN) 4 MG tablet Take 1 tablet (4 mg total) by mouth every 6 (six) hours.   traMADol (ULTRAM) 50 MG tablet TAKE 1 TABLET (50 MG TOTAL) BY MOUTH EVERY 12 (TWELVE) HOURS AS NEEDED (PAIN).   traZODone (DESYREL) 100 MG tablet  Take 0.5 tablets (50 mg total) by mouth at bedtime.   No facility-administered encounter medications on file as of 05/25/2023.    Allergies (verified) Gabapentin and Tylenol [acetaminophen]   History: Past Medical History:  Diagnosis Date   Acute gastritis without hemorrhage 11/09/2015   Acute lower UTI 06/13/2017   Acute metabolic encephalopathy 06/13/2017   Aneurysm of infrarenal abdominal aorta 06/13/2017   Infrarenal abdominal aortic aneurysm first noted on MRI of lumbar spine on 06/13/2017 measuring maximum diameter of 4.2 cm. CTA abd/pelvis on 09/24/18 showed 4.2 x 4.2 cm.   Both times, the radiologist recommended follow up with ultrasound in 1 year. Records in Care Everywhere F.W.   Aortic atherosclerosis    Benign nevus of skin 02/17/2019   Benign prostatic hyperplasia with weak urinary stream 04/13/2016   Bilateral inguinal hernia 07/23/2012   Bipolar 1 disorder    Bladder calculus 04/19/2021   CHF (congestive heart failure) 02/01/2021   a.) TTE 02/01/2021: EF 40%, mild LVH, mild LAE, midl PR, mod TR, G2DD.   Chronic anticoagulation 04/20/2021   Chronic insomnia 01/09/2018   Chronic low back pain without sciatica 04/11/2017   Chronic pain syndrome 04/13/2016   Chronic right hip pain 04/12/2018   Closed compression fracture of L1 vertebra 10/06/2016   Complicated urinary tract  infection 04/01/2021   COPD (chronic obstructive pulmonary disease)    Dementia, unclear etiology 09/14/2021   Diverticulosis    Frequent PVCs    Generalized anxiety disorder    HFrEF (heart failure with reduced ejection fraction) 04/19/2021   Hiatal hernia    HTN (hypertension) 04/19/2021   Hyperlipidemia    Hypertrophy of prostate with urinary obstruction and other lower urinary tract symptoms (LUTS)    Infrarenal abdominal aortic aneurysm (AAA) without rupture 09/24/2018   a.) CTA CAP 09/24/2018: 4.2 x 4.2 cm; b.) CT abd abd 05/29/2019: 4.0 cm; c.) aorta duplex 12/30/2020: 4.14 x 4.08 x 4.19 cm;  d.) CT abd/pel 02/21/2021: 4.6 cm; e.) CT renal 04/01/2021: 4.3 cm; f.) CT renal 04/19/2021: 4.6 cm   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   in hospital with sepsis   Long term current use of amiodarone    Long term current use of anticoagulant    a.) apixaban   Major depressive disorder    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   Nephrolithiasis 04/19/2021   Neuropathy of right foot    a.) s/p hip surgery   Osteopenia of multiple sites 01/10/2018   Osteoporosis    a.) on oral bisphosphonate   PAC (premature atrial contraction) 08/05/2021   PAF (paroxysmal atrial fibrillation)    a.) holter 07/25/2020: NSR, PAF/flutter (14% burden) with longest lasting 45 minutes, freq PACs (24.6% burden); b.) holter 06/01/2021: NSR, episodes of NSVT, SVT (longest lasting 20 beats), freq PACs (29.3% burden), PVCs (2.6% burden), vent bi/trigeminy   RBBB (right bundle branch block)    Restless leg syndrome    a.) on ropinirole   Seborrhea 10/20/2018   Seborrheic keratoses 02/09/2019   Sepsis secondary to UTI 12/31/2020   Thrombocytopenia 12/31/2020   Urinary retention 04/19/2021   UTI (urinary tract infection) 04/19/2021   Wears glasses    Wears partial dentures    top and bottom partial   Past Surgical History:  Procedure Laterality Date   CYSTOSCOPY WITH LITHOLAPAXY N/A 05/03/2021   Procedure: CYSTOSCOPY WITH LITHOLAPAXY holmium laser;  Surgeon: Despina Arias, MD;  Location: [facility];  Service: Urology;  Laterality: N/A;   HIP ARTHROPLASTY Right 2005   HOLEP-LASER ENUCLEATION OF THE PROSTATE WITH MORCELLATION N/A 01/20/2022   Procedure: HOLEP-LASER ENUCLEATION OF THE PROSTATE WITH MORCELLATION;  Surgeon: Sondra Come, MD;  Location: [facility];  Service: Urology;  Laterality: N/A;   INGUINAL HERNIA REPAIR Bilateral 08/16/2012   Procedure: LAPAROSCOPIC BILATERAL INGUINAL HERNIA REPAIR;  Surgeon: Wilmon Arms. Corliss Skains, MD;  Location: [facility];  Service: General;  Laterality: Bilateral;    INSERTION OF MESH Bilateral 08/16/2012   Procedure: INSERTION OF MESH;  Surgeon: Wilmon Arms. Corliss Skains, MD;  Location: [facility];  Service: General;  Laterality: Bilateral;   MULTIPLE TOOTH EXTRACTIONS     TONSILLECTOMY     age 56   UPPER GI ENDOSCOPY     Family History  Problem Relation Age of Onset   Depression Mother    Hypertension Father    Heart attack Father 56       2 HEART ATTACKS   Kidney cancer Father    Lupus Father    Alzheimer's disease Father    Dementia Father    Heart disease Brother    Depression Brother    Healthy Half-Brother    Migraines Neg Hx    Social History   Socioeconomic History   Marital status: Married    Spouse name: Not on file  Number of children: 2   Years of education: 13   Highest education level: Some college, no degree  Occupational History   Occupation: Artist  Tobacco Use   Smoking status: Every Day    Current packs/day: 0.25    Average packs/day: 0.3 packs/day for 50.0 years (12.5 ttl pk-yrs)    Types: Cigarettes   Smokeless tobacco: Never  Vaping Use   Vaping status: Never Used  Substance and Sexual Activity   Alcohol use: Not Currently    Comment: was an alchoholic, 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   Drug use: Yes    Types: Marijuana    Comment: QUIT IN 1985, still currently still uses marijuana QD   Sexual activity: Not Currently  Other Topics Concern   Not on file  Social History Narrative   2 adopted children   Right handed   Drinks coffee   2 story home   Social Drivers of Health   Financial Resource Strain: Not on file  Food Insecurity: Not on file  Transportation Needs: Not on file  Physical Activity: Not on file  Stress: Not on file  Social Connections: Not on file    Tobacco Counseling Ready to quit: Not Answered Counseling given: Not Answered   Clinical Intake:  Pre-visit preparation completed: Yes  Pain : No/denies pain     BMI - recorded: 27 Nutritional Risks: None Diabetes: No  How  often do you need to have someone help you when you read instructions, pamphlets, or other written materials from your doctor or pharmacy?: 1 - Never         Activities of Daily Living    05/25/2023   11:04 AM 05/25/2023   10:56 AM  In your present state of health, do you have any difficulty performing the following activities:  Hearing?  1  Vision?  0  Difficulty concentrating or making decisions?  0  Walking or climbing stairs?  0  Dressing or bathing?  0  Doing errands, shopping?  0  Preparing Food and eating ? N   Using the Toilet? N   In the past six months, have you accidently leaked urine? N   Do you have problems with loss of bowel control? N   Managing your Medications? Y   Managing your Finances? Y   Housekeeping or managing your Housekeeping? Y     Patient Care Team: Gillis Santa, NP as PCP - General (Internal Medicine) Marinus Maw, MD as PCP - Electrophysiology (Cardiology) Elwyn Reach (Neurology)  Indicate any recent Medical Services you may have received from other than [facility] providers in the past year (date may be approximate).     Assessment:   This is a routine wellness examination for [place].  Hearing/Vision screen Hearing Screening - Comments:: Patient does have problem with hearing Vision Screening - Comments:: No problem vision   Goals Addressed   None    Depression Screen    05/25/2023   10:48 AM 02/26/2023    9:40 AM 02/19/2023    1:38 PM 01/04/2023   11:06 AM 10/02/2022   11:13 AM 08/14/2022   11:08 AM 07/06/2022   11:05 AM  PHQ 2/9 Scores  PHQ - 2 Score 0 0 0 0 2 0 1  PHQ- 9 Score     4      Fall Risk    05/25/2023   10:47 AM 02/26/2023    9:40 AM 02/19/2023    1:38 PM 01/04/2023   11:06 AM  10/02/2022   11:12 AM  Fall Risk   Falls in the past year? 0 0 0 0 0  Number falls in past yr: 0 0 0 0 0  Injury with Fall? 0 0 0 0 0  Risk for fall due to : No Fall Risks No Fall Risks No Fall Risks No Fall Risks  History of fall(s)  Follow up Falls evaluation completed Falls evaluation completed Falls evaluation completed Falls evaluation completed Falls evaluation completed    MEDICARE RISK AT HOME:    TIMED UP AND GO:  Was the test performed?  No    Cognitive Function:    09/25/2021   10:00 AM  MMSE - Mini Mental State Exam  Orientation to time 3  Orientation to Place 3  Registration 3  Attention/ Calculation 2  Recall 3  Language- name 2 objects 2  Language- repeat 1  Language- follow 3 step command 2  Language- read & follow direction 1  Write a sentence 1  Copy design 1  Total score 22        05/25/2023   10:49 AM  6CIT Screen  What Year? 4 points  What month? 0 points  What time? 0 points  Count back from 20 0 points  Months in reverse 2 points  Repeat phrase 10 points  Total Score 16 points    Immunizations Immunization History  Administered Date(s) Administered   PFIZER(Purple Top)SARS-COV-2 Vaccination 03/04/2020, 03/25/2020   Tdap 04/10/2014    TDAP status: Up to date  Flu Vaccine status: Declined, Education has been provided regarding the importance of this vaccine but patient still declined. Advised may receive this vaccine at local pharmacy or Health Dept. Aware to provide a copy of the vaccination record if obtained from local pharmacy or Health Dept. Verbalized acceptance and understanding.  Pneumococcal vaccine status: Declined,  Education has been provided regarding the importance of this vaccine but patient still declined. Advised may receive this vaccine at local pharmacy or Health Dept. Aware to provide a copy of the vaccination record if obtained from local pharmacy or Health Dept. Verbalized acceptance and understanding.   Covid-19 vaccine status: Information provided on how to obtain vaccines.   Qualifies for Shingles Vaccine? Yes   Zostavax completed No   Shingrix Completed?: No.    Education has been provided regarding the importance of this  vaccine. Patient has been advised to call insurance company to determine out of pocket expense if they have not yet received this vaccine. Advised may also receive vaccine at local pharmacy or Health Dept. Verbalized acceptance and understanding.  Screening Tests Health Maintenance  Topic Date Due   Zoster Vaccines- Shingrix (1 of 2) Never done   Colonoscopy  Never done   INFLUENZA VACCINE  06/11/2023 (Originally 10/12/2022)   Pneumonia Vaccine 83+ Years old (1 of 2 - PCV) 02/19/2024 (Originally 12/25/1956)   COVID-19 Vaccine (3 - Pfizer risk series) 05/24/2024 (Originally 04/22/2020)   DTaP/Tdap/Td (2 - Td or Tdap) 04/10/2024   Medicare Annual Wellness (AWV)  05/24/2024   Hepatitis C Screening  Completed   HPV VACCINES  Aged Out    Health Maintenance  Health Maintenance Due  Topic Date Due   Zoster Vaccines- Shingrix (1 of 2) Never done   Colonoscopy  Never done    Has cologuard at home- needing to complete.   Lung Cancer Screening: (Low Dose CT Chest recommended if Age 24-80 years, 20 pack-year currently smoking OR have quit w/in 15years.) does qualify.  Lung Cancer Screening Referral: na  Additional Screening:  Hepatitis C Screening: does qualify; Completed  Vision Screening: Recommended annual ophthalmology exams for early detection of glaucoma and other disorders of the eye. Is the patient up to date with their annual eye exam?  Yes  Who is the provider or what is the name of the office in which the patient attends annual eye exams? [facility]  If pt is not established with a provider, would they like to be referred to a provider to establish care? No .   Dental Screening: Recommended annual dental exams for proper oral hygiene  Community Resource Referral / Chronic Care Management: CRR required this visit?  No   CCM required this visit?  No     Plan:     I have personally reviewed and noted the following in the patient's chart:   Medical and social  history Use of alcohol, tobacco or illicit drugs  Current medications and supplements including opioid prescriptions. Patient is currently taking opioid prescriptions. Information provided to patient regarding non-opioid alternatives. Patient advised to discuss non-opioid treatment plan with their provider. Functional ability and status Nutritional status Physical activity Advanced directives List of other physicians Hospitalizations, surgeries, and ER visits in previous 12 months Vitals Screenings to include cognitive, depression, and falls Referrals and appointments  In addition, I have reviewed and discussed with patient certain preventive protocols, quality metrics, and best practice recommendations. A written personalized care plan for preventive services as well as general preventive health recommendations were provided to patient.     Sharon Seller, NP   05/25/2023

## 2023-06-27 ENCOUNTER — Encounter: Payer: Self-pay | Admitting: Adult Health

## 2023-06-27 ENCOUNTER — Other Ambulatory Visit: Payer: Self-pay | Admitting: Adult Health

## 2023-06-27 DIAGNOSIS — M545 Low back pain, unspecified: Secondary | ICD-10-CM

## 2023-06-27 NOTE — Telephone Encounter (Signed)
 He had an appointment for [facility] on 12/26/22, was no show. Please call them to make appointment.

## 2023-06-27 NOTE — Telephone Encounter (Signed)
 There is another referral done to [facility] at [facility] and it says that it is ready for scheduling, tel # [phone]

## 2023-06-28 NOTE — Telephone Encounter (Signed)
 Patient is requesting a refill of the following medications: Requested Prescriptions   Pending Prescriptions Disp Refills   traMADol (ULTRAM) 50 MG tablet [Pharmacy Med Name: TRAMADOL HCL 50 MG TABLET] 60 tablet 0    Sig: TAKE 1 TABLET (50 MG TOTAL) BY MOUTH EVERY 12 (TWELVE) HOURS AS NEEDED (PAIN).    Date of last refill: 05/11/23  Refill amount: 60  Treatment agreement date: October 2024

## 2023-07-05 ENCOUNTER — Ambulatory Visit: Payer: 59 | Admitting: Adult Health

## 2023-07-05 ENCOUNTER — Encounter: Payer: Self-pay | Admitting: Adult Health

## 2023-07-05 VITALS — BP 118/78 | HR 62 | Temp 98.0°F | Resp 20 | Ht 66.0 in | Wt 172.2 lb

## 2023-07-05 DIAGNOSIS — M545 Low back pain, unspecified: Secondary | ICD-10-CM | POA: Diagnosis not present

## 2023-07-05 DIAGNOSIS — F01A0 Vascular dementia, mild, without behavioral disturbance, psychotic disturbance, mood disturbance, and anxiety: Secondary | ICD-10-CM | POA: Diagnosis not present

## 2023-07-05 DIAGNOSIS — Z720 Tobacco use: Secondary | ICD-10-CM

## 2023-07-05 DIAGNOSIS — M810 Age-related osteoporosis without current pathological fracture: Secondary | ICD-10-CM

## 2023-07-05 DIAGNOSIS — R269 Unspecified abnormalities of gait and mobility: Secondary | ICD-10-CM

## 2023-07-05 DIAGNOSIS — I480 Paroxysmal atrial fibrillation: Secondary | ICD-10-CM

## 2023-07-05 DIAGNOSIS — Z599 Problem related to housing and economic circumstances, unspecified: Secondary | ICD-10-CM | POA: Diagnosis not present

## 2023-07-05 DIAGNOSIS — G8929 Other chronic pain: Secondary | ICD-10-CM

## 2023-07-05 DIAGNOSIS — H9313 Tinnitus, bilateral: Secondary | ICD-10-CM

## 2023-07-05 DIAGNOSIS — F319 Bipolar disorder, unspecified: Secondary | ICD-10-CM

## 2023-07-05 DIAGNOSIS — F5104 Psychophysiologic insomnia: Secondary | ICD-10-CM

## 2023-07-05 MED ORDER — DEBROX 6.5 % OT SOLN
5.0000 [drp] | Freq: Two times a day (BID) | OTIC | Status: AC
Start: 2023-07-05 — End: 2023-07-09
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
## 2023-07-06 LAB — CBC WITH DIFFERENTIAL/PLATELET
Absolute Lymphocytes: 2190 {cells}/uL (ref 850–3900)
Absolute Monocytes: 764 {cells}/uL (ref 200–950)
Basophils Absolute: 28 {cells}/uL (ref 0–200)
Basophils Relative: 0.3 %
Eosinophils Absolute: 147 {cells}/uL (ref 15–500)
Eosinophils Relative: 1.6 %
HCT: 43.6 % (ref 38.5–50.0)
Hemoglobin: 14.7 g/dL (ref 13.2–17.1)
MCH: 30.9 pg (ref 27.0–33.0)
MCHC: 33.7 g/dL (ref 32.0–36.0)
MCV: 91.8 fL (ref 80.0–100.0)
MPV: 11.1 fL (ref 7.5–12.5)
Monocytes Relative: 8.3 %
Neutro Abs: 6072 {cells}/uL (ref 1500–7800)
Neutrophils Relative %: 66 %
Platelets: 218 10*3/uL (ref 140–400)
RBC: 4.75 10*6/uL (ref 4.20–5.80)
RDW: 13.3 % (ref 11.0–15.0)
Total Lymphocyte: 23.8 %
WBC: 9.2 10*3/uL (ref 3.8–10.8)

## 2023-07-06 LAB — COMPLETE METABOLIC PANEL WITHOUT GFR
AG Ratio: 1.3 (calc) (ref 1.0–2.5)
ALT: 5 U/L — ABNORMAL LOW (ref 9–46)
AST: 8 U/L — ABNORMAL LOW (ref 10–35)
Albumin: 3.7 g/dL (ref 3.6–5.1)
Alkaline phosphatase (APISO): 78 U/L (ref 35–144)
BUN: 17 mg/dL (ref 7–25)
CO2: 25 mmol/L (ref 20–32)
Calcium: 9 mg/dL (ref 8.6–10.3)
Chloride: 108 mmol/L (ref 98–110)
Creat: 1.09 mg/dL (ref 0.70–1.28)
Globulin: 2.8 g/dL (ref 1.9–3.7)
Glucose, Bld: 94 mg/dL (ref 65–139)
Potassium: 3.9 mmol/L (ref 3.5–5.3)
Sodium: 142 mmol/L (ref 135–146)
Total Bilirubin: 0.5 mg/dL (ref 0.2–1.2)
Total Protein: 6.5 g/dL (ref 6.1–8.1)

## 2023-07-06 LAB — TSH: TSH: 0.5 m[IU]/L (ref 0.40–4.50)

## 2023-07-08 NOTE — Progress Notes (Signed)
-   AST and ALT (liver enzymes) low,will monitor next visit -  no anemia -  tsh norma

## 2023-07-09 ENCOUNTER — Telehealth: Payer: Self-pay

## 2023-07-09 NOTE — Progress Notes (Signed)
   Telephone encounter was:  Unsuccessful.  07/09/2023 Name: Carlos Short. MRN: [id number] DOB: 09/10/1950  Unsuccessful outbound call made today to assist with:   Home repairs  Outreach Attempt:  1st Attempt  A HIPAA compliant voice message was left requesting a return call.  Instructed patient to call back   [name] [facility] Guide, Phone: [phone] Fax: [phone] Website: [email or web address]

## 2023-07-10 ENCOUNTER — Telehealth: Payer: Self-pay

## 2023-07-10 NOTE — Progress Notes (Signed)
   Telephone encounter was:  Unsuccessful.  07/10/2023 Name: Carlos Short. MRN: [id number] DOB: April 27, 1950  Unsuccessful outbound call made today to assist with:  Home Modifications  Outreach Attempt:  2nd Attempt    [name] [facility]  [facility] Guide, Phone: [phone] Fax: [phone] Website: [email or web address]

## 2023-07-11 ENCOUNTER — Telehealth: Payer: Self-pay

## 2023-07-11 NOTE — Telephone Encounter (Signed)
 Copied from CRM [id number]. Topic: General - Other >> Jul 11, 2023 11:29 AM Jayson Michael wrote: Reason for CRM: Lynnie Saucier from [facility] called regarding the hospice referral for the patient. She stated the patient is not eligible for hospice at this time, and the family has instead opted to accept palliative care services. Please notify the PCP that [facility] will be following the patient for palliative care moving forward. For questions, contact Tonya at [phone].
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
## 2023-07-11 NOTE — Telephone Encounter (Signed)
Message routed to PCP Medina-Vargas, Monina C, NP  

## 2023-07-13 ENCOUNTER — Encounter: Payer: Self-pay | Admitting: Adult Health

## 2023-07-13 ENCOUNTER — Ambulatory Visit (INDEPENDENT_AMBULATORY_CARE_PROVIDER_SITE_OTHER): Admitting: Adult Health

## 2023-07-13 VITALS — BP 120/80 | HR 79 | Temp 97.7°F | Resp 21 | Ht 70.5 in | Wt 171.8 lb

## 2023-07-13 DIAGNOSIS — F01A0 Vascular dementia, mild, without behavioral disturbance, psychotic disturbance, mood disturbance, and anxiety: Secondary | ICD-10-CM

## 2023-07-13 DIAGNOSIS — H6123 Impacted cerumen, bilateral: Secondary | ICD-10-CM

## 2023-07-13 DIAGNOSIS — F319 Bipolar disorder, unspecified: Secondary | ICD-10-CM | POA: Diagnosis not present

## 2023-07-13 DIAGNOSIS — I480 Paroxysmal atrial fibrillation: Secondary | ICD-10-CM

## 2023-07-13 MED ORDER — DEBROX 6.5 % OT SOLN: 5.0000 [drp] | OTIC | Status: AC

## 2023-07-13 NOTE — Progress Notes (Signed)
 [facility] clinic  Provider:  Inge Mangle DNP  Code Status: DNR  Goals of Care:     01/04/2023   11:38 AM  Advanced Directives  Does Patient Have a Medical Advance Directive? No  Would patient like information on creating a medical advance directive? No - Patient declined     Chief Complaint  Patient presents with   Follow-up    Discussed the use of AI scribe software for clinical note transcription with the patient, who gave verbal consent to proceed.  HPI: Patient is a 73 y.o. male seen today for an acute visit for ear lavage. He is accompanied by his wife.  He completed a course of Debrox for four days, from Monday through Thursday, but did not use it on the morning of the visit. He experiences constant tinnitus and itching in his ears, which he describes as 'driving me out of my mind.'   He has a history of paroxysmal atrial fibrillation for which he takes amiodarone  200 mg Monday to Friday and Eliquis  5 mg twice daily. No palpitations are reported, and his heart rate has been consistent during previous visits.  He has dementia, and there has been some confusion regarding his diagnosis, with previous mentions of Alzheimer's disease. There have been issues with scheduling a PET scan due to incorrect coding, leading to frustration and a lack of follow-through from the previous healthcare provider. He takes memantine  5 mg twice daily for his dementia.  He has a history of bipolar disorder, for which he takes Brexpiprazole 1.5 mg every other day and Mirtazapine 15 mg at bedtime. He is scheduled to see a new psychiatrist on May 15th via telehealth through [place].  He smokes two to three cigarettes a day. His wife mentions that he is preparing for palliative care services to begin at home, with an intake scheduled for the upcoming Tuesday, 07/17/23. He is also considering physical therapy as part of his palliative care plan to assist with his mobility issues.    Past Medical  History:  Diagnosis Date   Acute gastritis without hemorrhage 11/09/2015   Acute lower UTI 06/13/2017   Acute metabolic encephalopathy 06/13/2017   Aneurysm of infrarenal abdominal aorta 06/13/2017   Infrarenal abdominal aortic aneurysm first noted on MRI of lumbar spine on 06/13/2017 measuring maximum diameter of 4.2 cm. CTA abd/pelvis on 09/24/18 showed 4.2 x 4.2 cm.   Both times, the radiologist recommended follow up with ultrasound in 1 year. Records in Care Everywhere [facility].   Aortic atherosclerosis    Benign nevus of skin 02/17/2019   Benign prostatic hyperplasia with weak urinary stream 04/13/2016   Bilateral inguinal hernia 07/23/2012   Bipolar 1 disorder    Bladder calculus 04/19/2021   CHF (congestive heart failure) 02/01/2021   a.) TTE 02/01/2021: EF 40%, mild LVH, mild LAE, midl PR, mod TR, G2DD.   Chronic anticoagulation 04/20/2021   Chronic insomnia 01/09/2018   Chronic low back pain without sciatica 04/11/2017   Chronic pain syndrome 04/13/2016   Chronic right hip pain 04/12/2018   Closed compression fracture of L1 vertebra 10/06/2016   Complicated urinary tract infection 04/01/2021   COPD (chronic obstructive pulmonary disease)    Dementia, unclear etiology 09/14/2021   Diverticulosis    Frequent PVCs    Generalized anxiety disorder    HFrEF (heart failure with reduced ejection fraction) 04/19/2021   Hiatal hernia    HTN (hypertension) 04/19/2021   Hyperlipidemia    Hypertrophy of prostate with urinary obstruction and  other lower urinary tract symptoms (LUTS)    Infrarenal abdominal aortic aneurysm (AAA) without rupture 09/24/2018   a.) CTA CAP 09/24/2018: 4.2 x 4.2 cm; b.) CT abd abd 05/29/2019: 4.0 cm; c.) aorta duplex 12/30/2020: 4.14 x 4.08 x 4.19 cm; d.) CT abd/pel 02/21/2021: 4.6 cm; e.) CT renal 04/01/2021: 4.3 cm; f.) CT renal 04/19/2021: 4.6 cm   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   in hospital with sepsis   Long term current use of amiodarone     Long term  current use of anticoagulant    a.) apixaban    Major depressive disorder    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   Nephrolithiasis 04/19/2021   Neuropathy of right foot    a.) s/p hip surgery   Osteopenia of multiple sites 01/10/2018   Osteoporosis    a.) on oral bisphosphonate   PAC (premature atrial contraction) 08/05/2021   PAF (paroxysmal atrial fibrillation)    a.) holter 07/25/2020: NSR, PAF/flutter (14% burden) with longest lasting 45 minutes, freq PACs (24.6% burden); b.) holter 06/01/2021: NSR, episodes of NSVT, SVT (longest lasting 20 beats), freq PACs (29.3% burden), PVCs (2.6% burden), vent bi/trigeminy   RBBB (right bundle branch block)    Restless leg syndrome    a.) on ropinirole    Seborrhea 10/20/2018   Seborrheic keratoses 02/09/2019   Sepsis secondary to UTI 12/31/2020   Thrombocytopenia 12/31/2020   Urinary retention 04/19/2021   UTI (urinary tract infection) 04/19/2021   Wears glasses    Wears partial dentures    top and bottom partial    Past Surgical History:  Procedure Laterality Date   CYSTOSCOPY WITH LITHOLAPAXY N/A 05/03/2021   Procedure: CYSTOSCOPY WITH LITHOLAPAXY holmium laser;  Surgeon: Mallie Seal, MD;  Location: [facility];  Service: Urology;  Laterality: N/A;   HIP ARTHROPLASTY Right 2005   HOLEP-LASER ENUCLEATION OF THE PROSTATE WITH MORCELLATION N/A 01/20/2022   Procedure: HOLEP-LASER ENUCLEATION OF THE PROSTATE WITH MORCELLATION;  Surgeon: Lawerence Pressman, MD;  Location: [facility];  Service: Urology;  Laterality: N/A;   INGUINAL HERNIA REPAIR Bilateral 08/16/2012   Procedure: LAPAROSCOPIC BILATERAL INGUINAL HERNIA REPAIR;  Surgeon: Kari Otto. Eli Grizzle, MD;  Location: [facility];  Service: General;  Laterality: Bilateral;   INSERTION OF MESH Bilateral 08/16/2012   Procedure: INSERTION OF MESH;  Surgeon: Kari Otto. Eli Grizzle, MD;  Location: [facility];  Service: General;  Laterality: Bilateral;   MULTIPLE TOOTH EXTRACTIONS      TONSILLECTOMY     age 2   UPPER GI ENDOSCOPY      Allergies  Allergen Reactions   Gabapentin Other (See Comments)    crazy   Tylenol  [Acetaminophen ] Other (See Comments)    Hurts his kidneys    Outpatient Encounter Medications as of 07/13/2023  Medication Sig   amiodarone  (PACERONE ) 200 MG tablet TAKE 1 TABLET (200 MG TOTAL) BY MOUTH DAILY. MONDAY - FRIDAY ONLY   apixaban  (ELIQUIS ) 5 MG TABS tablet TAKE 1 TABLET BY MOUTH TWICE A DAY   cariprazine (VRAYLAR) 1.5 MG capsule Take 1.5 mg by mouth every other day.   ibandronate  (BONIVA ) 150 MG tablet Take 1 tablet (150 mg total) by mouth every 30 (thirty) days. 3rd of each month   memantine  (NAMENDA ) 5 MG tablet TAKE 1 TABLET (5 MG AT NIGHT) FOR 2 WEEKS, THEN INCREASE TO 1 TABLET (5 MG) TWICE A DAY   mirtazapine (REMERON) 15 MG tablet Take 15 mg by mouth at bedtime.   ondansetron  (ZOFRAN ) 4 MG tablet  Take 1 tablet (4 mg total) by mouth every 6 (six) hours.   traMADol  (ULTRAM ) 50 MG tablet TAKE 1 TABLET (50 MG TOTAL) BY MOUTH EVERY 12 (TWELVE) HOURS AS NEEDED (PAIN).   traZODone  (DESYREL ) 100 MG tablet Take 0.5 tablets (50 mg total) by mouth at bedtime.   No facility-administered encounter medications on file as of 07/13/2023.    Review of Systems:  Review of Systems  Constitutional:  Negative for activity change, appetite change and fever.  HENT:  Negative for sore throat.   Eyes: Negative.   Cardiovascular:  Negative for chest pain and leg swelling.  Gastrointestinal:  Negative for abdominal distention, diarrhea and vomiting.  Genitourinary:  Negative for dysuria, frequency and urgency.  Skin:  Negative for color change.  Neurological:  Negative for dizziness and headaches.  Psychiatric/Behavioral:  Negative for behavioral problems and sleep disturbance. The patient is not nervous/anxious.     Health Maintenance  Topic Date Due   Zoster Vaccines- Shingrix (1 of 2) Never done   Colonoscopy  09/04/2023 (Originally 12/26/1995)    Pneumonia Vaccine 83+ Years old (1 of 2 - PCV) 02/19/2024 (Originally 12/25/1969)   COVID-19 Vaccine (3 - Pfizer risk series) 05/24/2024 (Originally 04/22/2020)   INFLUENZA VACCINE  10/12/2023   DTaP/Tdap/Td (2 - Td or Tdap) 04/10/2024   Medicare Annual Wellness (AWV)  05/24/2024   Hepatitis C Screening  Completed   HPV VACCINES  Aged Out   Meningococcal B Vaccine  Aged Out    Physical Exam: Vitals:   07/13/23 0859  BP: 120/80  Pulse: 79  Resp: (!) 21  Temp: 97.7 F (36.5 C)  SpO2: 98%  Weight: 171 lb 12.8 oz (77.9 kg)  Height: 5' 10.5" (1.791 m)   Body mass index is 24.3 kg/m. Physical Exam Constitutional:      Appearance: Normal appearance.  HENT:     Head: Normocephalic and atraumatic.     Mouth/Throat:     Mouth: Mucous membranes are moist.  Eyes:     Conjunctiva/sclera: Conjunctivae normal.  Cardiovascular:     Rate and Rhythm: Normal rate and regular rhythm.     Pulses: Normal pulses.     Heart sounds: Normal heart sounds.  Pulmonary:     Effort: Pulmonary effort is normal.     Breath sounds: Normal breath sounds.  Abdominal:     General: Bowel sounds are normal.     Palpations: Abdomen is soft.  Musculoskeletal:        General: No swelling. Normal range of motion.     Cervical back: Normal range of motion.  Skin:    General: Skin is warm and dry.  Neurological:     Mental Status: He is alert.  Psychiatric:        Mood and Affect: Mood normal.        Behavior: Behavior normal.        Thought Content: Thought content normal.        Judgment: Judgment normal.     Labs reviewed: Basic Metabolic Panel: Recent Labs    08/09/22 1129 08/14/22 1209 10/02/22 1201 07/05/23 1141  NA 140 142  --  142  K 3.3* 4.3  --  3.9  CL 107 108  --  108  CO2 23 26  --  25  GLUCOSE 112* 90  --  94  BUN 15 14  --  17  CREATININE 1.07 1.00  --  1.09  CALCIUM 9.0 9.0  --  9.0  TSH  --   --  1.04 0.50   Liver Function Tests: Recent Labs    08/09/22 1129  07/05/23 1141  AST 47* 8*  ALT 72* 5*  ALKPHOS 74  --   BILITOT 1.2 0.5  PROT 7.3 6.5  ALBUMIN 3.6  --    Recent Labs    08/09/22 1129  LIPASE 45   No results for input(s): "AMMONIA" in the last 8760 hours. CBC: Recent Labs    08/09/22 1129 07/05/23 1141  WBC 8.6 9.2  NEUTROABS 7.3 6,072  HGB 14.3 14.7  HCT 42.9 43.6  MCV 88.5 91.8  PLT 176 218   Lipid Panel: No results for input(s): "CHOL", "HDL", "LDLCALC", "TRIG", "CHOLHDL", "LDLDIRECT" in the last 8760 hours. Lab Results  Component Value Date   HGBA1C 5.5 11/29/2021    Procedures since last visit: No results found.  Assessment/Plan  1. Bilateral impacted cerumen (Primary) -  Persistent tinnitus and itching post-Debrox treatment. Ear irrigation improved symptoms, especially in the left ear. - Use Debrox drops twice a week to manage dry earwax and prevent buildup. - carbamide peroxide (DEBROX) 6.5 % OTIC solution; Place 5 drops into both ears 2 (two) times a week.  2. PAF (paroxysmal atrial fibrillation) -  Well-controlled with amiodarone  and Eliquis . No palpitations reported.  3. 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 Previous neurology referral unsuccessful. Discussed potential palliative care support and physical therapy for mobility. - Refer to [facility] for further evaluation and management. - Coordinate with palliative care team for potential physical therapy to assist with mobility. - Ambulatory referral to Neurology  4. Bipolar 1 disorder (HCC) -  Managed with Brexipiprazole and mirtazapine. Follow-up with new psychiatrist scheduled for May 15th via telehealth      Labs/tests ordered:   None   Return if symptoms worsen or fail to improve.  Chandra Feger Medina-Vargas, NP

## 2023-07-19 ENCOUNTER — Other Ambulatory Visit: Payer: Self-pay | Admitting: Adult Health

## 2023-07-19 DIAGNOSIS — I480 Paroxysmal atrial fibrillation: Secondary | ICD-10-CM

## 2023-07-31 ENCOUNTER — Other Ambulatory Visit: Payer: Self-pay | Admitting: Internal Medicine

## 2023-07-31 ENCOUNTER — Other Ambulatory Visit: Payer: Self-pay | Admitting: Adult Health

## 2023-07-31 DIAGNOSIS — G8929 Other chronic pain: Secondary | ICD-10-CM

## 2023-07-31 NOTE — Telephone Encounter (Signed)
 Pharmacy requested refill.  Epic LR: 06/28/2023 Contract Date: 01/04/2023  Pended Rx and sent to [place] for approval.

## 2023-08-23 ENCOUNTER — Encounter: Payer: Self-pay | Admitting: Podiatry

## 2023-08-23 ENCOUNTER — Ambulatory Visit (INDEPENDENT_AMBULATORY_CARE_PROVIDER_SITE_OTHER): Admitting: Podiatry

## 2023-08-23 DIAGNOSIS — D696 Thrombocytopenia, unspecified: Secondary | ICD-10-CM

## 2023-08-23 DIAGNOSIS — B351 Tinea unguium: Secondary | ICD-10-CM

## 2023-08-23 DIAGNOSIS — M79675 Pain in left toe(s): Secondary | ICD-10-CM | POA: Diagnosis not present

## 2023-08-23 DIAGNOSIS — M79674 Pain in right toe(s): Secondary | ICD-10-CM

## 2023-08-23 NOTE — Progress Notes (Signed)
 This patient returns to my office for at risk foot care.  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 This patient is unable to cut nails himself since the patient cannot reach his nails.These nails are painful walking and wearing shoes.  He presents to the office with his wife.This patient presents for at risk foot care today.  General Appearance  Alert, conversant and in no acute stress.  Vascular  Dorsalis pedis and posterior tibial  pulses are palpable  bilaterally.  Capillary return is within normal limits  bilaterally. Temperature is within normal limits  bilaterally.  Neurologic  Senn-Weinstein monofilament wire test within normal limits  bilaterally. Muscle power within normal limits bilaterally.  Nails Thick disfigured discolored nails with subungual debris  from hallux to fifth toes bilaterally. No evidence of bacterial infection or drainage bilaterally.  Orthopedic  No limitations of motion  feet .  No crepitus or effusions noted.  No bony pathology or digital deformities noted.  Skin  normotropic skin with no porokeratosis noted bilaterally.  No signs of infections or ulcers noted.     Onychomycosis  Pain in right toes  Pain in left toes  Consent was obtained for treatment procedures.   Mechanical debridement of nails 1-5  bilaterally performed with a nail nipper.  Filed with dremel without incident.    Return office visit    3 months                  Told patient to return for periodic foot care and evaluation due to potential at risk complications.   Ruffin Cotton DPM

## 2023-10-12 ENCOUNTER — Encounter: Payer: Self-pay | Admitting: Adult Health

## 2023-10-12 ENCOUNTER — Ambulatory Visit: Admitting: Adult Health

## 2023-10-12 VITALS — BP 124/78 | HR 60 | Temp 97.5°F | Resp 20 | Ht 70.5 in | Wt 170.0 lb

## 2023-10-12 DIAGNOSIS — G8929 Other chronic pain: Secondary | ICD-10-CM

## 2023-10-12 DIAGNOSIS — F01A0 Vascular dementia, mild, without behavioral disturbance, psychotic disturbance, mood disturbance, and anxiety: Secondary | ICD-10-CM

## 2023-10-12 DIAGNOSIS — I480 Paroxysmal atrial fibrillation: Secondary | ICD-10-CM

## 2023-10-12 DIAGNOSIS — F5101 Primary insomnia: Secondary | ICD-10-CM

## 2023-10-12 DIAGNOSIS — F319 Bipolar disorder, unspecified: Secondary | ICD-10-CM

## 2023-10-12 DIAGNOSIS — M545 Low back pain, unspecified: Secondary | ICD-10-CM

## 2023-10-12 DIAGNOSIS — M810 Age-related osteoporosis without current pathological fracture: Secondary | ICD-10-CM

## 2023-10-12 MED ORDER — TRAMADOL HCL 50 MG PO TABS: 50.0000 mg | ORAL_TABLET | Freq: Two times a day (BID) | ORAL | 0 refills | Status: AC | PRN

## 2023-10-12 NOTE — Progress Notes (Signed)
 [facility]  Provider:  Jereld Serum DNP  Code Status:  DNR  Goals of Care:     07/13/2023   10:39 AM  Advanced Directives  Does Patient Have a Medical Advance Directive? Yes  Type of Advance Directive Out of facility DNR (pink MOST or yellow form)     Chief Complaint  Patient presents with   Medical Management of Chronic Issues    3 months follow-up   Discussed the use of AI scribe software for clinical note transcription with the patient, who gave verbal consent to proceed.   HPI: Patient is a 73 y.o. male seen today for a 53-month follow up of chronic medical issues. He was accompanied by his wife.  He is currently on palliative care with a DNR code status. Due to concerns about his walking and the danger posed by stairs, he is being moved downstairs in his home. No shortness of breath is reported, and he has quit smoking.  For atrial fibrillation, he is taking Eliquis  5 mg twice a day and amiodarone  200 mg daily, Monday to Friday.   Regarding bipolar disorder, he is taking Rexulti 0.5 mg daily, which was started in May. He follows up with a psychiatrist through [facility] and has a telehealth appointment in a couple of weeks. He was switched from Vraylar to Rexulti due to daily crying episodes, which seem to be improving. He is also taking Remeron 15 mg at bedtime and trazodone  50 mg at bedtime for sleep and mood stabilization.  For mild mixed vascular neurodegenerative dementia, he takes memantine  5 mg twice a day. The dosage has remained unchanged since it was ordered in September.  He has osteoporosis and takes Boniva  150 mg every 30 days.  He experiences lower back pain for which he takes tramadol  50 mg every 12 hours as needed. He has been using his caregiver's tramadol  when his runs out.  His caregiver reports that he has been bathing more regularly, now at least once or twice a week, which is an improvement.    Past Medical History:  Diagnosis Date   Acute  gastritis without hemorrhage 11/09/2015   Acute lower UTI 06/13/2017   Acute metabolic encephalopathy 06/13/2017   Aneurysm of infrarenal abdominal aorta 06/13/2017   Infrarenal abdominal aortic aneurysm first noted on MRI of lumbar spine on 06/13/2017 measuring maximum diameter of 4.2 cm. CTA abd/pelvis on 09/24/18 showed 4.2 x 4.2 cm.   Both times, the radiologist recommended follow up with ultrasound in 1 year. Records in Care Everywhere F.W.   Aortic atherosclerosis    Benign nevus of skin 02/17/2019   Benign prostatic hyperplasia with weak urinary stream 04/13/2016   Bilateral inguinal hernia 07/23/2012   Bipolar 1 disorder    Bladder calculus 04/19/2021   CHF (congestive heart failure) 02/01/2021   a.) TTE 02/01/2021: EF 40%, mild LVH, mild LAE, midl PR, mod TR, G2DD.   Chronic anticoagulation 04/20/2021   Chronic insomnia 01/09/2018   Chronic low back pain without sciatica 04/11/2017   Chronic pain syndrome 04/13/2016   Chronic right hip pain 04/12/2018   Closed compression fracture of L1 vertebra 10/06/2016   Complicated urinary tract infection 04/01/2021   COPD (chronic obstructive pulmonary disease)    Dementia, unclear etiology 09/14/2021   Diverticulosis    Frequent PVCs    Generalized anxiety disorder    HFrEF (heart failure with reduced ejection fraction) 04/19/2021   Hiatal hernia    HTN (hypertension) 04/19/2021   Hyperlipidemia  Hypertrophy of prostate with urinary obstruction and other lower urinary tract symptoms (LUTS)    Infrarenal abdominal aortic aneurysm (AAA) without rupture 09/24/2018   a.) CTA CAP 09/24/2018: 4.2 x 4.2 cm; b.) CT abd abd 05/29/2019: 4.0 cm; c.) aorta duplex 12/30/2020: 4.14 x 4.08 x 4.19 cm; d.) CT abd/pel 02/21/2021: 4.6 cm; e.) CT renal 04/01/2021: 4.3 cm; f.) CT renal 04/19/2021: 4.6 cm   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   in hospital with sepsis   Long term current use of amiodarone     Long term current use of anticoagulant     a.) apixaban    Major depressive disorder    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   Nephrolithiasis 04/19/2021   Neuropathy of right foot    a.) s/p hip surgery   Osteopenia of multiple sites 01/10/2018   Osteoporosis    a.) on oral bisphosphonate   PAC (premature atrial contraction) 08/05/2021   PAF (paroxysmal atrial fibrillation)    a.) holter 07/25/2020: NSR, PAF/flutter (14% burden) with longest lasting 45 minutes, freq PACs (24.6% burden); b.) holter 06/01/2021: NSR, episodes of NSVT, SVT (longest lasting 20 beats), freq PACs (29.3% burden), PVCs (2.6% burden), vent bi/trigeminy   RBBB (right bundle branch block)    Restless leg syndrome    a.) on ropinirole    Seborrhea 10/20/2018   Seborrheic keratoses 02/09/2019   Sepsis secondary to UTI 12/31/2020   Thrombocytopenia 12/31/2020   Urinary retention 04/19/2021   UTI (urinary tract infection) 04/19/2021   Wears glasses    Wears partial dentures    top and bottom partial    Past Surgical History:  Procedure Laterality Date   CYSTOSCOPY WITH LITHOLAPAXY N/A 05/03/2021   Procedure: CYSTOSCOPY WITH LITHOLAPAXY holmium laser;  Surgeon: Lovie Arlyss CROME, MD;  Location: [facility];  Service: Urology;  Laterality: N/A;   HIP ARTHROPLASTY Right 2005   HOLEP-LASER ENUCLEATION OF THE PROSTATE WITH MORCELLATION N/A 01/20/2022   Procedure: HOLEP-LASER ENUCLEATION OF THE PROSTATE WITH MORCELLATION;  Surgeon: Francisca Redell BROCKS, MD;  Location: [facility];  Service: Urology;  Laterality: N/A;   INGUINAL HERNIA REPAIR Bilateral 08/16/2012   Procedure: LAPAROSCOPIC BILATERAL INGUINAL HERNIA REPAIR;  Surgeon: Donnice POUR. Belinda, MD;  Location: [facility];  Service: General;  Laterality: Bilateral;   INSERTION OF MESH Bilateral 08/16/2012   Procedure: INSERTION OF MESH;  Surgeon: Donnice POUR. Belinda, MD;  Location: [facility];  Service: General;  Laterality: Bilateral;   MULTIPLE TOOTH EXTRACTIONS     TONSILLECTOMY     age 41   UPPER GI  ENDOSCOPY      Allergies  Allergen Reactions   Gabapentin Other (See Comments)    crazy   Tylenol  [Acetaminophen ] Other (See Comments)    Hurts his kidneys    Outpatient Encounter Medications as of 10/12/2023  Medication Sig   amiodarone  (PACERONE ) 200 MG tablet TAKE 1 TABLET (200 MG TOTAL) BY MOUTH DAILY. MONDAY - FRIDAY ONLY   carbamide peroxide (DEBROX) 6.5 % OTIC solution Place 5 drops into both ears 2 (two) times a week.   cariprazine (VRAYLAR) 1.5 MG capsule Take 1.5 mg by mouth every other day.   ELIQUIS  5 MG TABS tablet TAKE 1 TABLET BY MOUTH TWICE A DAY   ibandronate  (BONIVA ) 150 MG tablet Take 1 tablet (150 mg total) by mouth every 30 (thirty) days. 3rd of each month   memantine  (NAMENDA ) 5 MG tablet TAKE 1 TABLET (5 MG AT NIGHT) FOR 2 WEEKS, THEN INCREASE TO 1 TABLET (  5 MG) TWICE A DAY   mirtazapine (REMERON) 15 MG tablet Take 15 mg by mouth at bedtime.   ondansetron  (ZOFRAN ) 4 MG tablet Take 1 tablet (4 mg total) by mouth every 6 (six) hours.   REXULTI 0.5 MG TABS Take 1 tablet by mouth daily.   traZODone  (DESYREL ) 100 MG tablet Take 0.5 tablets (50 mg total) by mouth at bedtime.   [DISCONTINUED] traMADol  (ULTRAM ) 50 MG tablet TAKE 1 TABLET (50 MG TOTAL) BY MOUTH EVERY 12 (TWELVE) HOURS AS NEEDED (PAIN).   traMADol  (ULTRAM ) 50 MG tablet Take 1 tablet (50 mg total) by mouth every 12 (twelve) hours as needed (pain).   No facility-administered encounter medications on file as of 10/12/2023.    Review of Systems:  Review of Systems  Constitutional:  Negative for activity change, appetite change and fever.  HENT:  Negative for sore throat.   Eyes: Negative.   Cardiovascular:  Negative for chest pain and leg swelling.  Gastrointestinal:  Negative for abdominal distention, diarrhea and vomiting.  Genitourinary:  Negative for dysuria, frequency and urgency.  Skin:  Negative for color change.  Neurological:  Negative for dizziness and headaches.  Psychiatric/Behavioral:   Negative for behavioral problems and sleep disturbance. The patient is not nervous/anxious.     Health Maintenance  Topic Date Due   Zoster Vaccines- Shingrix (1 of 2) Never done   INFLUENZA VACCINE  10/12/2023   Pneumococcal Vaccine: 50+ Years (1 of 2 - PCV) 02/19/2024 (Originally 12/25/1969)   COVID-19 Vaccine (3 - Pfizer risk series) 05/24/2024 (Originally 04/22/2020)   DTaP/Tdap/Td (2 - Td or Tdap) 04/10/2024   Medicare Annual Wellness (AWV)  05/24/2024   Hepatitis C Screening  Completed   Hepatitis B Vaccines  Aged Out   HPV VACCINES  Aged Out   Meningococcal B Vaccine  Aged Out   Colonoscopy  Discontinued    Physical Exam: Vitals:   10/12/23 0950  BP: 124/78  Pulse: 60  Resp: 20  Temp: (!) 97.5 F (36.4 C)  SpO2: 99%  Weight: 170 lb (77.1 kg)  Height: 5' 10.5 (1.791 m)   Body mass index is 24.05 kg/m. Physical Exam Constitutional:      Appearance: Normal appearance.  HENT:     Head: Normocephalic and atraumatic.     Mouth/Throat:     Mouth: Mucous membranes are moist.  Eyes:     Conjunctiva/sclera: Conjunctivae normal.  Cardiovascular:     Rate and Rhythm: Normal rate and regular rhythm.     Pulses: Normal pulses.     Heart sounds: Normal heart sounds.  Pulmonary:     Effort: Pulmonary effort is normal.     Breath sounds: Normal breath sounds.  Abdominal:     General: Bowel sounds are normal.     Palpations: Abdomen is soft.  Musculoskeletal:        General: No swelling or deformity. Normal range of motion.     Cervical back: Normal range of motion.  Skin:    General: Skin is warm and dry.  Neurological:     General: No focal deficit present.     Mental Status: He is alert and oriented to person, place, and time.  Psychiatric:        Mood and Affect: Mood normal.        Behavior: Behavior normal.        Thought Content: Thought content normal.        Judgment: Judgment normal.     Labs reviewed:  Basic Metabolic Panel: Recent Labs     07/05/23 1141  NA 142  K 3.9  CL 108  CO2 25  GLUCOSE 94  BUN 17  CREATININE 1.09  CALCIUM 9.0  TSH 0.50   Liver Function Tests: Recent Labs    07/05/23 1141  AST 8*  ALT 5*  BILITOT 0.5  PROT 6.5   No results for input(s): LIPASE, AMYLASE in the last 8760 hours. No results for input(s): AMMONIA in the last 8760 hours. CBC: Recent Labs    07/05/23 1141  WBC 9.2  NEUTROABS 6,072  HGB 14.7  HCT 43.6  MCV 91.8  PLT 218   Lipid Panel: No results for input(s): CHOL, HDL, LDLCALC, TRIG, CHOLHDL, LDLDIRECT in the last 8760 hours. Lab Results  Component Value Date   HGBA1C 5.5 11/29/2021    Procedures since last visit: No results found.  Assessment/Plan  1. PAF (paroxysmal atrial fibrillation) (HCC) (Primary) -   managed with Eliquis  and amiodarone . Heart rate regular.  2. Mild mixed vascular and neurodegenerative dementia without behavioral disturbance, psychotic disturbance, mood disturbance, or anxiety (HCC) -  managed with Memantine  5 mg BID  3. Chronic midline low back pain without sciatica -  managed with tramadol . Tramadol  effective for pain management - traMADol  (ULTRAM ) 50 MG tablet; Take 1 tablet (50 mg total) by mouth every 12 (twelve) hours as needed (pain).  Dispense: 60 tablet; Refill: 0  4. Bipolar 1 disorder (HCC) -  managed with Rexulti -  Rexulti effective for depression and behavior without drowsiness. -  Follow-up with psychiatrist via telehealth in a couple of weeks. - REXULTI 0.5 MG TABS; Take 1 tablet by mouth daily.  5. Osteoporosis, unspecified osteoporosis type, unspecified pathological fracture presence -  Osteoporosis managed with Boniva .  6. Primary insomnia -  continue Remeron 15 mg at HS -  continue  100 mg at HS      Palliative care management On palliative care with DNR status. Safety concerns with stairs addressed by moving downstairs.    Labs/tests ordered:  None   Return in about 3 months  (around 01/12/2024).  Jericha Bryden Medina-Vargas, NP

## 2023-10-31 ENCOUNTER — Encounter: Payer: Self-pay | Admitting: Adult Health

## 2023-11-17 ENCOUNTER — Other Ambulatory Visit: Payer: Self-pay | Admitting: Adult Health

## 2023-11-17 DIAGNOSIS — M8589 Other specified disorders of bone density and structure, multiple sites: Secondary | ICD-10-CM

## 2023-11-21 ENCOUNTER — Telehealth: Payer: Self-pay | Admitting: Podiatry

## 2023-11-21 NOTE — Telephone Encounter (Signed)
 Called and left message on patients wife's voicemail trying to confirm appointment cancellation.

## 2023-11-23 ENCOUNTER — Ambulatory Visit: Admitting: Podiatry

## 2023-11-26 ENCOUNTER — Encounter: Payer: Self-pay | Admitting: Diagnostic Neuroimaging

## 2023-11-26 ENCOUNTER — Ambulatory Visit (INDEPENDENT_AMBULATORY_CARE_PROVIDER_SITE_OTHER): Admitting: Diagnostic Neuroimaging

## 2023-11-26 VITALS — BP 102/77 | HR 68 | Ht 70.0 in | Wt 168.0 lb

## 2023-11-26 DIAGNOSIS — F03B18 Unspecified dementia, moderate, with other behavioral disturbance: Secondary | ICD-10-CM

## 2023-11-26 NOTE — Patient Instructions (Addendum)
  MEMORY / COGNITIVE DECLINE / VISUAL/AUDITORY HALLUCINATIONS (most consistent with moderate dementia) - check ATN panel - increase memantine  to 10mg  twice a day - try to stay active physically and get some exercise (at least 15-30 minutes per day) - eat a nutritious diet with lean protein, plants / vegetables, whole grains; avoid ultra-processed foods - increase social activities, brain stimulation, games, puzzles, hobbies, crafts, arts, music; try new activities; keep it fun! - aim for at least 7-8 hours sleep per night (or more) - avoid smoking and alcohol - caution with medications, finances; no driving - safety / supervision issues reviewed - caregiver resources provided (including [email or web address]) - follow up with PCP and psychiatry

## 2023-11-26 NOTE — Progress Notes (Signed)
 [facility]  PATIENT: Carlos Short. DOB: 1950/12/11  REFERRING CLINICIAN: Medina-Vargas, Monina C* HISTORY FROM: patient  REASON FOR VISIT: new consult    HISTORICAL  CHIEF COMPLAINT:  Chief Complaint  Patient presents with   Dementia    Rm 6 with spouse  Pt is well, spouse reports ongoing emotional imbalance, he cries randomly. Some agitation but its overall stable. He is overall dependant on help with daily activities.     HISTORY OF PRESENT ILLNESS:   73 year old male here for evaluation of memory loss /dementia.  Going to wife patient has had some problems with confusion, hearing and seeing things, memory loss starting around 2020.  Around that time he was having issues with his heart and recurrent UTIs.  By 2023 he saw a neurologist, had MRI, neuropsychology testing, lab testing and was diagnosed with dementia.  At 1 point a PET scan was considered but this could not be approved by insurance.  Patient was maintained on memantine  and his psychiatry medications for bipolar disorder, which he has had since age 63 years old.  Since then patient requested second opinion evaluation with us .  Patient able to maintain his ADLs in terms of toileting, eating and transferring.  Needs help with bathing and dressing.  Needs help with shopping cooking managing medications transportation finances and communication.  Gait and balance has deteriorated over time.  Has some short steps and shuffling gait as well.  Hallucinations are under control presently.  Patient was born in [place]  but grew up in [place].  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   REVIEW OF SYSTEMS: Full 14 system review of systems performed and negative with exception of: as per hPI.  ALLERGIES: Allergies  Allergen Reactions   Gabapentin Other (See Comments)    crazy   Tylenol   [Acetaminophen ] Other (See Comments)    Hurts his kidneys    HOME MEDICATIONS: Outpatient Medications Prior to Visit  Medication Sig Dispense Refill   amiodarone  (PACERONE ) 200 MG tablet TAKE 1 TABLET (200 MG TOTAL) BY MOUTH DAILY. MONDAY - FRIDAY ONLY 30 tablet 2   carbamide peroxide (DEBROX) 6.5 % OTIC solution Place 5 drops into both ears 2 (two) times a week.     ELIQUIS  5 MG TABS tablet TAKE 1 TABLET BY MOUTH TWICE A DAY 60 tablet 5   ibandronate  (BONIVA ) 150 MG tablet TAKE 1 TABLET (150 MG TOTAL) BY MOUTH EVERY 30 (THIRTY) DAYS. 3RD OF EACH MONTH 3 tablet 1   memantine  (NAMENDA ) 5 MG tablet TAKE 1 TABLET (5 MG AT NIGHT) FOR 2 WEEKS, THEN INCREASE TO 1 TABLET (5 MG) TWICE A DAY 180 tablet 4   mirtazapine (REMERON) 15 MG tablet Take 15 mg by mouth at bedtime.     traMADol  (ULTRAM ) 50 MG tablet Take 1 tablet (50 mg total) by mouth every 12 (twelve) hours as needed (pain). 60 tablet 0   traZODone  (DESYREL ) 100 MG tablet Take 0.5 tablets (50 mg total) by mouth at bedtime. 45 tablet 1   cariprazine (VRAYLAR) 1.5 MG capsule Take 1.5 mg by mouth every other day. (Patient not taking: Reported on 11/26/2023)     ondansetron  (ZOFRAN ) 4 MG tablet Take 1 tablet (4 mg total) by mouth every 6 (six) hours. (Patient not taking: Reported on 11/26/2023) 12 tablet 0   REXULTI 0.5 MG TABS Take 1 tablet by  mouth daily. (Patient not taking: Reported on 11/26/2023)     No facility-administered medications prior to visit.    PAST MEDICAL HISTORY: Past Medical History:  Diagnosis Date   Acute gastritis without hemorrhage 11/09/2015   Acute lower UTI 06/13/2017   Acute metabolic encephalopathy 06/13/2017   Aneurysm of infrarenal abdominal aorta 06/13/2017   Infrarenal abdominal aortic aneurysm first noted on MRI of lumbar spine on 06/13/2017 measuring maximum diameter of 4.2 cm. CTA abd/pelvis on 09/24/18 showed 4.2 x 4.2 cm.   Both times, the radiologist recommended follow up with ultrasound in 1 year. Records in  Care Everywhere [facility].   Aortic atherosclerosis    Benign nevus of skin 02/17/2019   Benign prostatic hyperplasia with weak urinary stream 04/13/2016   Bilateral inguinal hernia 07/23/2012   Bipolar 1 disorder    Bladder calculus 04/19/2021   CHF (congestive heart failure) 02/01/2021   a.) TTE 02/01/2021: EF 40%, mild LVH, mild LAE, midl PR, mod TR, G2DD.   Chronic anticoagulation 04/20/2021   Chronic insomnia 01/09/2018   Chronic low back pain without sciatica 04/11/2017   Chronic pain syndrome 04/13/2016   Chronic right hip pain 04/12/2018   Closed compression fracture of L1 vertebra 10/06/2016   Complicated urinary tract infection 04/01/2021   COPD (chronic obstructive pulmonary disease)    Dementia, unclear etiology 09/14/2021   Diverticulosis    Frequent PVCs    Generalized anxiety disorder    HFrEF (heart failure with reduced ejection fraction) 04/19/2021   Hiatal hernia    HTN (hypertension) 04/19/2021   Hyperlipidemia    Hypertrophy of prostate with urinary obstruction and other lower urinary tract symptoms (LUTS)    Infrarenal abdominal aortic aneurysm (AAA) without rupture 09/24/2018   a.) CTA CAP 09/24/2018: 4.2 x 4.2 cm; b.) CT abd abd 05/29/2019: 4.0 cm; c.) aorta duplex 12/30/2020: 4.14 x 4.08 x 4.19 cm; d.) CT abd/pel 02/21/2021: 4.6 cm; e.) CT renal 04/01/2021: 4.3 cm; f.) CT renal 04/19/2021: 4.6 cm   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   in hospital with sepsis   Long term current use of amiodarone     Long term current use of anticoagulant    a.) apixaban    Major depressive disorder    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   Nephrolithiasis 04/19/2021   Neuropathy of right foot    a.) s/p hip surgery   Osteopenia of multiple sites 01/10/2018   Osteoporosis    a.) on oral bisphosphonate   PAC (premature atrial contraction) 08/05/2021   PAF (paroxysmal atrial fibrillation)    a.) holter 07/25/2020: NSR, PAF/flutter (14% burden) with longest lasting 45 minutes, freq  PACs (24.6% burden); b.) holter 06/01/2021: NSR, episodes of NSVT, SVT (longest lasting 20 beats), freq PACs (29.3% burden), PVCs (2.6% burden), vent bi/trigeminy   RBBB (right bundle branch block)    Restless leg syndrome    a.) on ropinirole    Seborrhea 10/20/2018   Seborrheic keratoses 02/09/2019   Sepsis secondary to UTI 12/31/2020   Thrombocytopenia 12/31/2020   Urinary retention 04/19/2021   UTI (urinary tract infection) 04/19/2021   Wears glasses    Wears partial dentures    top and bottom partial    PAST SURGICAL HISTORY: Past Surgical History:  Procedure Laterality Date   CYSTOSCOPY WITH LITHOLAPAXY N/A 05/03/2021   Procedure: CYSTOSCOPY WITH LITHOLAPAXY holmium laser;  Surgeon: Lovie Arlyss CROME, MD;  Location: [facility];  Service: Urology;  Laterality: N/A;   HIP ARTHROPLASTY Right 2005   HOLEP-LASER ENUCLEATION OF THE PROSTATE  WITH MORCELLATION N/A 01/20/2022   Procedure: HOLEP-LASER ENUCLEATION OF THE PROSTATE WITH MORCELLATION;  Surgeon: Francisca Redell BROCKS, MD;  Location: [facility];  Service: Urology;  Laterality: N/A;   INGUINAL HERNIA REPAIR Bilateral 08/16/2012   Procedure: LAPAROSCOPIC BILATERAL INGUINAL HERNIA REPAIR;  Surgeon: Donnice POUR. Belinda, MD;  Location: [facility];  Service: General;  Laterality: Bilateral;   INSERTION OF MESH Bilateral 08/16/2012   Procedure: INSERTION OF MESH;  Surgeon: Donnice POUR. Belinda, MD;  Location: [facility];  Service: General;  Laterality: Bilateral;   MULTIPLE TOOTH EXTRACTIONS     TONSILLECTOMY     age 28   UPPER GI ENDOSCOPY      FAMILY HISTORY: Family History  Problem Relation Age of Onset   Depression Mother    Hypertension Father    Heart attack Father 16       2 HEART ATTACKS   Kidney cancer Father    Lupus Father    Alzheimer's disease Father    Dementia Father    Heart disease Brother    Depression Brother    Healthy Half-Brother    Migraines Neg Hx     SOCIAL HISTORY: Social History    Socioeconomic History   Marital status: Married    Spouse name: Not on file   Number of children: 2   Years of education: 13   Highest education level: Some college, no degree  Occupational History   Occupation: Artist  Tobacco Use   Smoking status: Every Day    Current packs/day: 0.25    Average packs/day: 0.3 packs/day for 50.0 years (12.5 ttl pk-yrs)    Types: Cigarettes   Smokeless tobacco: Never  Vaping Use   Vaping status: Never Used  Substance and Sexual Activity   Alcohol use: Not Currently    Comment: was an alchoholic, 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   Drug use: Yes    Types: Marijuana    Comment: QUIT IN 1985, still currently still uses marijuana QD   Sexual activity: Not Currently  Other Topics Concern   Not on file  Social History Narrative   2 adopted children   Right handed   Drinks coffee   2 story home   Social Drivers of Health   Financial Resource Strain: Not on file  Food Insecurity: Not on file  Transportation Needs: Not on file  Physical Activity: Not on file  Stress: Not on file  Social Connections: Not on file  Intimate Partner Violence: Not on file     PHYSICAL EXAM  GENERAL EXAM/CONSTITUTIONAL: Vitals:  Vitals:   11/26/23 1120  BP: 102/77  Pulse: 68  Weight: 168 lb (76.2 kg)  Height: 5' 10 (1.778 m)   Body mass index is 24.11 kg/m. Wt Readings from Last 3 Encounters:  11/26/23 168 lb (76.2 kg)  10/12/23 170 lb (77.1 kg)  07/13/23 171 lb 12.8 oz (77.9 kg)   Patient is in no distress; well developed, nourished and groomed; neck is supple  CARDIOVASCULAR: Examination of carotid arteries is normal; no carotid bruits Regular rate and rhythm, no murmurs Examination of peripheral vascular system by observation and palpation is normal  EYES: Ophthalmoscopic exam of optic discs and posterior segments is normal; no papilledema or hemorrhages No results found.  MUSCULOSKELETAL: Gait, strength, tone, movements noted in Neurologic exam  below  NEUROLOGIC: MENTAL STATUS:     11/26/2023   11:23 AM 09/25/2021   10:00 AM  MMSE - Mini Mental State Exam  Orientation  to time 1 3  Orientation to Place 3 3  Registration 3 3  Attention/ Calculation 0 2  Recall 0 3  Language- name 2 objects 2 2  Language- repeat 1 1  Language- follow 3 step command 3 2  Language- read & follow direction 1 1  Write a sentence 1 1  Copy design 1 1  Total score 16 22   awake, alert, oriented to person DECR MEMORY  DECR attention and concentration language fluent, comprehension intact, naming intact fund of knowledge appropriate  CRANIAL NERVE:  2nd - no papilledema on fundoscopic exam 2nd, 3rd, 4th, 6th - pupils equal and reactive to light, visual fields full to confrontation, extraocular muscles intact, no nystagmus 5th - facial sensation symmetric 7th - facial strength symmetric 8th - hearing intact 9th - palate elevates symmetrically, uvula midline 11th - shoulder shrug symmetric 12th - tongue protrusion midline  MOTOR:  normal bulk and tone, full strength in the BUE, BLE  SENSORY:  normal and symmetric to light touch, temperature, vibration  COORDINATION:  finger-nose-finger, fine finger movements normal  REFLEXES:  deep tendon reflexes present and symmetric  GAIT/STATION:  narrow based gait; SHORT STEPS, SLOW GAIT     DIAGNOSTIC DATA (LABS, IMAGING, TESTING) - I reviewed patient records, labs, notes, testing and imaging myself where available.  Lab Results  Component Value Date   WBC 9.2 07/05/2023   HGB 14.7 07/05/2023   HCT 43.6 07/05/2023   MCV 91.8 07/05/2023   PLT 218 07/05/2023      Component Value Date/Time   NA 142 07/05/2023 1141   NA 143 05/25/2022 1115   K 3.9 07/05/2023 1141   CL 108 07/05/2023 1141   CO2 25 07/05/2023 1141   GLUCOSE 94 07/05/2023 1141   BUN 17 07/05/2023 1141   BUN 16 05/25/2022 1115   CREATININE 1.09 07/05/2023 1141   CALCIUM 9.0 07/05/2023 1141   PROT 6.5 07/05/2023  1141   PROT 6.1 05/25/2022 1115   ALBUMIN 3.6 08/09/2022 1129   ALBUMIN 4.0 05/25/2022 1115   AST 8 (L) 07/05/2023 1141   ALT 5 (L) 07/05/2023 1141   ALKPHOS 74 08/09/2022 1129   BILITOT 0.5 07/05/2023 1141   BILITOT 0.4 05/25/2022 1115   GFRNONAA >60 08/09/2022 1129   GFRAA >60 09/24/2018 1203   Lab Results  Component Value Date   CHOL 179 11/29/2021   HDL 63 11/29/2021   LDLCALC 101 (H) 11/29/2021   TRIG 64 11/29/2021   CHOLHDL 2.8 11/29/2021   Lab Results  Component Value Date   HGBA1C 5.5 11/29/2021   Lab Results  Component Value Date   VITAMINB12 245 09/22/2021   Lab Results  Component Value Date   TSH 0.50 07/05/2023    10/03/21 MRI brain [I reviewed images myself and agree with interpretation. -VRP]  - No acute intracranial process. Advanced cerebral volume loss for age, without lobar predominance.    ASSESSMENT AND PLAN  73 y.o. year old male here with:   Dx:  1. Moderate dementia with other behavioral disturbance, unspecified dementia type (HCC)     PLAN:  MEMORY / COGNITIVE DECLINE / VISUAL/AUDITORY HALLUCINATIONS (most consistent with moderate dementia) - check ATN panel - increase memantine  to 10mg  twice a day - try to stay active physically and get some exercise (at least 15-30 minutes per day) - eat a nutritious diet with lean protein, plants / vegetables, whole grains; avoid ultra-processed foods - increase social activities, brain stimulation, games, puzzles, hobbies, crafts,  arts, music; try new activities; keep it fun! - aim for at least 7-8 hours sleep per night (or more) - avoid smoking and alcohol - caution with medications, finances; no driving - safety / supervision issues reviewed - caregiver resources provided (including [email or web address]) - follow up with PCP and psychiatry  Orders Placed This Encounter  Procedures   ATN PROFILE   Return for return to PCP, pending if symptoms worsen or fail to improve.    EDUARD FABIENE HANLON, MD  11/26/2023, 12:14 PM Certified in Neurology, Neurophysiology and Neuroimaging  [facility] [address] [place], [place] [address] [phone]

## 2023-11-29 LAB — ATN PROFILE
A -- Beta-amyloid 42/40 Ratio: 0.099 — AB (ref >0.102)
Beta-amyloid 40: 201.48 pg/mL
Beta-amyloid 42: 20.01 pg/mL
N -- NfL, Plasma: 4.48 pg/mL (ref 0.00–6.04)
T -- p-tau181: 2.73 pg/mL — AB (ref 0.00–0.97)

## 2023-12-04 ENCOUNTER — Telehealth: Payer: Self-pay

## 2023-12-04 ENCOUNTER — Ambulatory Visit: Payer: Self-pay | Admitting: Diagnostic Neuroimaging

## 2023-12-04 NOTE — Telephone Encounter (Signed)
 Message routed to PCP Medina-Vargas, Monina C, NP and admin staff for Hospice.

## 2023-12-04 NOTE — Telephone Encounter (Signed)
 Copied from CRM [id number]. Topic: General - Other >> Dec 04, 2023  3:51 PM Suzette B wrote: Reason for CRM: Ms. Carlos Short had request to a hospice request for patient please fax to [phone] Hospice Order, please clarify PCP  would like to remain the attending on record

## 2023-12-05 ENCOUNTER — Other Ambulatory Visit: Payer: Self-pay | Admitting: Adult Health

## 2023-12-05 DIAGNOSIS — F03B3 Unspecified dementia, moderate, with mood disturbance: Secondary | ICD-10-CM

## 2023-12-05 NOTE — Telephone Encounter (Signed)
 Please advise if you will be the attending

## 2023-12-05 NOTE — Telephone Encounter (Signed)
Yes, I can be the attending

## 2023-12-05 NOTE — Telephone Encounter (Signed)
 Hospice order done and jury duty letter of excuse will be mailed, handed to Alondra, front desk.

## 2023-12-06 ENCOUNTER — Other Ambulatory Visit: Payer: Self-pay | Admitting: Internal Medicine

## 2023-12-13 ENCOUNTER — Other Ambulatory Visit: Payer: Self-pay | Admitting: Internal Medicine

## 2023-12-18 ENCOUNTER — Other Ambulatory Visit: Payer: Self-pay | Admitting: Internal Medicine

## 2024-01-18 ENCOUNTER — Ambulatory Visit: Payer: Self-pay | Admitting: Adult Health

## 2024-01-20 ENCOUNTER — Other Ambulatory Visit: Payer: Self-pay | Admitting: Adult Health

## 2024-01-20 DIAGNOSIS — I480 Paroxysmal atrial fibrillation: Secondary | ICD-10-CM

## 2024-02-15 ENCOUNTER — Other Ambulatory Visit: Payer: Self-pay | Admitting: Adult Health

## 2024-02-15 DIAGNOSIS — M8589 Other specified disorders of bone density and structure, multiple sites: Secondary | ICD-10-CM

## 2024-02-15 MED ORDER — ALENDRONATE SODIUM 70 MG PO TABS: 70.0000 mg | ORAL_TABLET | ORAL | 11 refills | Status: AC

## 2024-02-15 NOTE — Telephone Encounter (Signed)
 Sent prescription for Fosamax  since Boniva  is not covered. Take Fosamax  once a week.

## 2024-02-15 NOTE — Telephone Encounter (Signed)
 Left message for patient to return call to office in reference to refill request. If patient calls back please advise that a new prescription has been sent and to discontinue Boniva .

## 2024-02-15 NOTE — Telephone Encounter (Signed)
 Pharmacy states that medication is not covered

## 2024-02-15 NOTE — Telephone Encounter (Signed)
 Sent eRx for Fosamax , discontinue Boniva .

## 2024-03-19 ENCOUNTER — Emergency Department (HOSPITAL_COMMUNITY)

## 2024-03-19 ENCOUNTER — Encounter (HOSPITAL_COMMUNITY): Payer: Self-pay | Admitting: Pharmacy Technician

## 2024-03-19 ENCOUNTER — Other Ambulatory Visit: Payer: Self-pay

## 2024-03-19 ENCOUNTER — Emergency Department (HOSPITAL_COMMUNITY)
Admission: EM | Admit: 2024-03-19 | Discharge: 2024-03-19 | Disposition: A | Discharge status: Home / Self Care | Attending: Emergency Medicine | Admitting: Emergency Medicine

## 2024-03-19 DIAGNOSIS — Z7901 Long term (current) use of anticoagulants: Secondary | ICD-10-CM | POA: Insufficient documentation

## 2024-03-19 DIAGNOSIS — F0390 Unspecified dementia without behavioral disturbance: Secondary | ICD-10-CM | POA: Insufficient documentation

## 2024-03-19 DIAGNOSIS — W010XXA Fall on same level from slipping, tripping and stumbling without subsequent striking against object, initial encounter: Secondary | ICD-10-CM | POA: Diagnosis not present

## 2024-03-19 DIAGNOSIS — D696 Thrombocytopenia, unspecified: Secondary | ICD-10-CM | POA: Diagnosis not present

## 2024-03-19 DIAGNOSIS — S22080A Wedge compression fracture of T11-T12 vertebra, initial encounter for closed fracture: Secondary | ICD-10-CM

## 2024-03-19 DIAGNOSIS — W19XXXA Unspecified fall, initial encounter: Secondary | ICD-10-CM

## 2024-03-19 DIAGNOSIS — I5020 Unspecified systolic (congestive) heart failure: Secondary | ICD-10-CM | POA: Insufficient documentation

## 2024-03-19 DIAGNOSIS — I110 Hypertensive heart disease with heart failure: Secondary | ICD-10-CM | POA: Insufficient documentation

## 2024-03-19 DIAGNOSIS — S22088A Other fracture of T11-T12 vertebra, initial encounter for closed fracture: Secondary | ICD-10-CM | POA: Insufficient documentation

## 2024-03-19 DIAGNOSIS — S3992XA Unspecified injury of lower back, initial encounter: Secondary | ICD-10-CM | POA: Diagnosis present

## 2024-03-19 LAB — CBC WITH DIFFERENTIAL/PLATELET
Abs Immature Granulocytes: 0.07 K/uL (ref 0.00–0.07)
Basophils Absolute: 0 K/uL (ref 0.0–0.1)
Basophils Relative: 0 %
Eosinophils Absolute: 0.1 K/uL (ref 0.0–0.5)
Eosinophils Relative: 1 %
HCT: 45.2 % (ref 39.0–52.0)
Hemoglobin: 15.1 g/dL (ref 13.0–17.0)
Immature Granulocytes: 1 %
Lymphocytes Relative: 17 %
Lymphs Abs: 1.5 K/uL (ref 0.7–4.0)
MCH: 30.9 pg (ref 26.0–34.0)
MCHC: 33.4 g/dL (ref 30.0–36.0)
MCV: 92.4 fL (ref 80.0–100.0)
Monocytes Absolute: 0.8 K/uL (ref 0.1–1.0)
Monocytes Relative: 9 %
Neutro Abs: 6.5 K/uL (ref 1.7–7.7)
Neutrophils Relative %: 72 %
Platelets: 147 K/uL — ABNORMAL LOW (ref 150–400)
RBC: 4.89 MIL/uL (ref 4.22–5.81)
RDW: 13.9 % (ref 11.5–15.5)
WBC: 9 K/uL (ref 4.0–10.5)
nRBC: 0 % (ref 0.0–0.2)

## 2024-03-19 LAB — BASIC METABOLIC PANEL WITH GFR
Anion gap: 11 (ref 5–15)
BUN: 15 mg/dL (ref 8–23)
CO2: 24 mmol/L (ref 22–32)
Calcium: 9.1 mg/dL (ref 8.9–10.3)
Chloride: 105 mmol/L (ref 98–111)
Creatinine, Ser: 1.12 mg/dL (ref 0.61–1.24)
GFR, Estimated: >60 mL/min
Glucose, Bld: 100 mg/dL — ABNORMAL HIGH (ref 70–99)
Potassium: 3.9 mmol/L (ref 3.5–5.1)
Sodium: 139 mmol/L (ref 135–145)

## 2024-03-19 LAB — PROTIME-INR
INR: 1.2 (ref 0.8–1.2)
Prothrombin Time: 15.5 s — ABNORMAL HIGH (ref 11.4–15.2)

## 2024-03-19 MED ORDER — MORPHINE SULFATE (PF) 2 MG/ML IV SOLN
2.0000 mg | Freq: Once | INTRAVENOUS | Status: AC
  Filled 2024-03-19: qty 1

## 2024-03-19 MED ORDER — HYDROMORPHONE HCL 1 MG/ML IJ SOLN: 2.0000 mg | Freq: Once | INTRAMUSCULAR | Status: DC

## 2024-03-19 MED ORDER — HYDROMORPHONE HCL 1 MG/ML IJ SOLN
1.0000 mg | Freq: Once | INTRAMUSCULAR | Status: AC
  Administered 2024-03-19: 1 mg via INTRAVENOUS
  Filled 2024-03-19: qty 1

## 2024-03-19 MED ORDER — MORPHINE SULFATE (PF) 2 MG/ML IV SOLN
2.0000 mg | Freq: Once | INTRAVENOUS | Status: AC
  Administered 2024-03-19: 2 mg via INTRAVENOUS
  Filled 2024-03-19: qty 1

## 2024-03-19 MED ORDER — IOHEXOL 300 MG/ML  SOLN
100.0000 mL | Freq: Once | INTRAMUSCULAR | Status: AC | PRN
  Administered 2024-03-19: 100 mL via INTRAVENOUS

## 2024-03-19 MED ORDER — OXYCODONE HCL 5 MG PO TABS
5.0000 mg | ORAL_TABLET | Freq: Once | ORAL | Status: AC
  Administered 2024-03-19: 5 mg via ORAL
  Filled 2024-03-19: qty 1

## 2024-03-19 MED ORDER — OXYCODONE HCL 5 MG PO TABS
5.0000 mg | ORAL_TABLET | Freq: Four times a day (QID) | ORAL | Status: DC | PRN
  Administered 2024-03-19: 5 mg via ORAL
  Filled 2024-03-19: qty 1

## 2024-03-19 MED ORDER — ACETAMINOPHEN 325 MG PO TABS
650.0000 mg | ORAL_TABLET | ORAL | Status: DC | PRN
  Administered 2024-03-19: 650 mg via ORAL
  Filled 2024-03-19: qty 2

## 2024-03-19 MED ORDER — MORPHINE SULFATE (PF) 2 MG/ML IV SOLN: 2.0000 mg | Freq: Once | INTRAVENOUS | Status: DC | PRN

## 2024-03-19 MED ORDER — OXYCODONE HCL 5 MG PO TABS: 5.0000 mg | ORAL_TABLET | ORAL | 0 refills | Status: AC | PRN

## 2024-03-19 NOTE — ED Provider Notes (Signed)
 " [place] EMERGENCY DEPARTMENT AT [facility] Provider Note   CSN: [id number] Arrival date & time: 03/19/24  0840     History Chief Complaint  Patient presents with   Back Pain   HPI: Carlos Short. is a 74 y.o. male with history perinent for dementia, HFrEF, HTN, chronic low back pain, prior L1 compression fracture, AAA, thrombocytopenia, A-fib, bipolar disorder, depression, HLD who presents complaining of pain after fall. Patient arrived via EMS accompanied by wife.  History provided by patient and spouse/partner.  No interpreter required during this encounter.  Patient reports that he was in bed this morning when he was trying to get up and his cat tripped him up and he fell onto the ground onto his buttocks.  Reports that he has significant pain in his lower back and buttocks.  Took Tylenol  prior to arrival, no interventions and route with EMS.  Patient reports that he is on hospice, is unable to describe his goals of care.  Wife present to bedside, clarifies that patient has Alzheimer's.  Confirms that he had a fall this morning, and that he is still on blood thinners for his atrial fibrillation.  Reports that otherwise he is acting at baseline.  Discussed goals of care, and whether or not they are here primarily for management of symptoms such as pain, or if they would like further workup including imaging, and wife clarifies that they would like imaging and workup at this time.  Prior to Admission medications  Medication Sig Start Date End Date Taking? Authorizing Provider  alendronate  (FOSAMAX ) 70 MG tablet Take 1 tablet (70 mg total) by mouth every 7 (seven) days. Take with a full glass of water  on an empty stomach. 02/15/24   Medina-Vargas, Monina C, NP  amiodarone  (PACERONE ) 200 MG tablet Take 1 tablet (200 mg total) by mouth daily. Monday - Friday only PLEASE MAKE AN APPOINTMENT IN ORDER TO RECEIVE ADDITIONAL REFILLS, THIRD AND FINAL ATTEMPT. 12/18/23   Waddell Danelle ORN, MD  carbamide peroxide (DEBROX) 6.5 % OTIC solution Place 5 drops into both ears 2 (two) times a week. 07/16/23   Medina-Vargas, Monina C, NP  ELIQUIS  5 MG TABS tablet TAKE 1 TABLET BY MOUTH TWICE A DAY 01/21/24   Medina-Vargas, Monina C, NP  memantine  (NAMENDA ) 5 MG tablet TAKE 1 TABLET (5 MG AT NIGHT) FOR 2 WEEKS, THEN INCREASE TO 1 TABLET (5 MG) TWICE A DAY 11/16/21   Wertman, Sara E, PA-C  mirtazapine (REMERON) 15 MG tablet Take 15 mg by mouth at bedtime. 09/19/22   [provider]  traMADol  (ULTRAM ) 50 MG tablet Take 1 tablet (50 mg total) by mouth every 12 (twelve) hours as needed (pain). 10/12/23   Medina-Vargas, Monina C, NP  traZODone  (DESYREL ) 100 MG tablet Take 0.5 tablets (50 mg total) by mouth at bedtime. 10/02/22   Medina-Vargas, Monina C, NP     Allergies: Gabapentin and Tylenol  [acetaminophen ]   Review of Systems   ROS as per HPI  Physical Exam Updated Vital Signs BP 120/80 (BP Location: Right Arm)   Pulse (!) 53   Temp 98.1 F (36.7 C) (Oral)   Resp 18   SpO2 99%  Physical Exam Vitals and nursing note reviewed.  Constitutional:      General: He is not in acute distress.    Appearance: He is well-developed.  HENT:     Head: Normocephalic and atraumatic.  Eyes:     Conjunctiva/sclera: Conjunctivae normal.  Cardiovascular:  Rate and Rhythm: Normal rate and regular rhythm.     Heart sounds: No murmur heard. Pulmonary:     Effort: Pulmonary effort is normal. No respiratory distress.     Breath sounds: Normal breath sounds.  Abdominal:     Palpations: Abdomen is soft.     Tenderness: There is no abdominal tenderness.  Musculoskeletal:        General: No swelling.     Cervical back: Neck supple. No bony tenderness.     Thoracic back: Bony tenderness (Lower) present.     Lumbar back: Bony tenderness (Upper) present.  Skin:    General: Skin is warm and dry.     Capillary Refill: Capillary refill takes less than 2 seconds.  Neurological:     Mental  Status: He is alert.  Psychiatric:        Mood and Affect: Mood normal.     ED Course/ Medical Decision Making/ A&P    Procedures Procedures   Medications Ordered in ED Medications  oxyCODONE  (Oxy IR/ROXICODONE ) immediate release tablet 5 mg (has no administration in time range)  acetaminophen  (TYLENOL ) tablet 650 mg (has no administration in time range)  oxyCODONE  (Oxy IR/ROXICODONE ) immediate release tablet 5 mg (5 mg Oral Given 03/19/24 1009)    Or  morphine  (PF) 2 MG/ML injection 2 mg ( Intravenous See Alternative 03/19/24 1009)  iohexol  (OMNIPAQUE ) 300 MG/ML solution 100 mL (100 mLs Intravenous Contrast Given 03/19/24 1109)  morphine  (PF) 2 MG/ML injection 2 mg (2 mg Intravenous Given 03/19/24 1144)    Medical Decision Making:   Carlos Short. is a 74 y.o. male who presents for fall out of bed as per above.  Physical exam is pertinent for tenderness to palpation of the lower T-spine/upper L-spine.   The differential includes but is not limited to ICH, TBI, skull fracture, spinal fracture/dislocation, blunt thoracic trauma, hemothorax, pneumothorax, rib fractures, blunt abdominal trauma, hemorrhage, extremity fracture, dislocation.  Independent historian: Spouse/partner  External data reviewed: No pertinent external data  Labs: Ordered and Independent interpretation CBC: No leukocytosis, anemia, mild thrombocytopenia, stable absolute value on comparison to prior. BMP: No AKI or emergent electrolyte derangement Coags: Reassuring  Radiology: Ordered, Independent interpretation, and All images reviewed independently.  Agree with radiology report at this time.   CT head: No ICH or displaced skull fracture CT C-spine: No displaced fracture or dislocation CT T/L-spine: No displaced fracture or dislocation CT CAP: No pneumothorax, hemothorax, intra-abdominal free air, significant intra-abdominal free fluid, overt bony displacement CT T-SPINE NO CHARGE Result Date:  03/19/2024 CLINICAL DATA:  Fall from standing height. Patient on blood thinners. Lower back pain. EXAM: CT THORACIC SPINE WITHOUT CONTRAST; CT LUMBAR SPINE WITHOUT CONTRAST TECHNIQUE: Multidetector CT images of the thoracic were obtained using the standard protocol without intravenous contrast. RADIATION DOSE REDUCTION: This exam was performed according to the departmental dose-optimization program which includes automated exposure control, adjustment of the mA and/or kV according to patient size and/or use of iterative reconstruction technique. COMPARISON:  CT abdomen/pelvis 04/19/2021 FINDINGS: THORACIC SPINE: Alignment: Normal. Vertebrae: Minimal spondylosis over the mid to lower thoracic spine. There is mild compression deformity probably involving the superior endplate of T12 which is new since 04/19/2021 and may be acute from patient's recent fall. There is approximate 30% loss of T12 vertebral body height. Minimal confines of the AP diameter of the spinal canal at the T10-11 level due to facet arthropathy. Paraspinal and other soft tissues: Negative. Disc levels: Within normal. LUMBAR SPINE: Alignment:  Normal. Vertebrae: Stable severe L1 compression fracture and stable mild L2 compression fracture. There is also stable mild loss of vertebral body height of L4. No acute fractures involving the lumbar spine. Mild spondylosis of the lumbar spine to include facet arthropathy. Paraspinal and other soft tissues: Negative. Disc levels: L1-2 disc space is unremarkable. There is broad-based disc bulge over the L2-3 level. Mild bilateral neural foraminal narrowing at the L2-3 level. L3-4 level demonstrates mild broad-based disc bulge with minimal bilateral neural foraminal narrowing. L4-5 level demonstrates mild broad-based disc bulge with moderate bilateral neural foraminal narrowing. L5-S1 level demonstrates minimal broad-based disc bulge with minimal right-sided neural foraminal narrowing. Moderate infrarenal  abdominal aortic aneurysm which will be described on patient's CT abdomen same day. IMPRESSION: 1. Mild compression deformity involving the superior endplate of T12 which is new since 04/19/2021 and likely is an acute injury from patient's recent fall. Approximate 30% loss of T12 vertebral body height. 2. Stable severe L1 compression fracture and stable mild L2 compression fracture. Stable mild loss of vertebral body height of L4. 3. Mild spondylosis of the thoracic and lumbar spine. 4. Moderate infrarenal abdominal aortic aneurysm which will be described on patient's CT abdomen same day. 5. Aortic atherosclerosis. Aortic Atherosclerosis (ICD10-I70.0). Electronically Signed   By: Toribio Agreste M.D.   On: 03/19/2024 12:48   CT L-SPINE NO CHARGE Result Date: 03/19/2024 CLINICAL DATA:  Fall from standing height. Patient on blood thinners. Lower back pain. EXAM: CT THORACIC SPINE WITHOUT CONTRAST; CT LUMBAR SPINE WITHOUT CONTRAST TECHNIQUE: Multidetector CT images of the thoracic were obtained using the standard protocol without intravenous contrast. RADIATION DOSE REDUCTION: This exam was performed according to the departmental dose-optimization program which includes automated exposure control, adjustment of the mA and/or kV according to patient size and/or use of iterative reconstruction technique. COMPARISON:  CT abdomen/pelvis 04/19/2021 FINDINGS: THORACIC SPINE: Alignment: Normal. Vertebrae: Minimal spondylosis over the mid to lower thoracic spine. There is mild compression deformity probably involving the superior endplate of T12 which is new since 04/19/2021 and may be acute from patient's recent fall. There is approximate 30% loss of T12 vertebral body height. Minimal confines of the AP diameter of the spinal canal at the T10-11 level due to facet arthropathy. Paraspinal and other soft tissues: Negative. Disc levels: Within normal. LUMBAR SPINE: Alignment: Normal. Vertebrae: Stable severe L1 compression  fracture and stable mild L2 compression fracture. There is also stable mild loss of vertebral body height of L4. No acute fractures involving the lumbar spine. Mild spondylosis of the lumbar spine to include facet arthropathy. Paraspinal and other soft tissues: Negative. Disc levels: L1-2 disc space is unremarkable. There is broad-based disc bulge over the L2-3 level. Mild bilateral neural foraminal narrowing at the L2-3 level. L3-4 level demonstrates mild broad-based disc bulge with minimal bilateral neural foraminal narrowing. L4-5 level demonstrates mild broad-based disc bulge with moderate bilateral neural foraminal narrowing. L5-S1 level demonstrates minimal broad-based disc bulge with minimal right-sided neural foraminal narrowing. Moderate infrarenal abdominal aortic aneurysm which will be described on patient's CT abdomen same day. IMPRESSION: 1. Mild compression deformity involving the superior endplate of T12 which is new since 04/19/2021 and likely is an acute injury from patient's recent fall. Approximate 30% loss of T12 vertebral body height. 2. Stable severe L1 compression fracture and stable mild L2 compression fracture. Stable mild loss of vertebral body height of L4. 3. Mild spondylosis of the thoracic and lumbar spine. 4. Moderate infrarenal abdominal aortic aneurysm which will be described  on patient's CT abdomen same day. 5. Aortic atherosclerosis. Aortic Atherosclerosis (ICD10-I70.0). Electronically Signed   By: Toribio Agreste M.D.   On: 03/19/2024 12:48   CT CHEST ABDOMEN PELVIS W CONTRAST Result Date: 03/19/2024 EXAM: CT CHEST, ABDOMEN AND PELVIS WITH CONTRAST 03/19/2024 12:21:23 PM TECHNIQUE: CT of the chest, abdomen and pelvis was performed with the administration of 100 mL of iohexol  (OMNIPAQUE ) 300 MG/ML solution. Multiplanar reformatted images are provided for review. Automated exposure control, iterative reconstruction, and/or weight based adjustment of the mA/kV was utilized to reduce  the radiation dose to as low as reasonably achievable. COMPARISON: 04/19/2021, 09/24/2018 CLINICAL HISTORY: Polytrauma, blunt FINDINGS: CHEST: MEDIASTINUM AND LYMPH NODES: Small sliding type hiatal hernia. Multivessel coronary atherosclerosis. While not optimized for the evaluation of the pulmonary arteries, no central pulmonary embolus visualized. Calcified atherosclerosis throughout the thoracic aorta. The central airways are clear. No mediastinal, hilar or axillary lymphadenopathy. LUNGS AND PLEURA: Pleuroparenchymal scarring. 8 mm right middle lobe nodule (axial 85). Posterior bibasilar dependent atelectasis. No pleural effusion. No pneumothorax. ABDOMEN AND PELVIS: LIVER: A couple of small cysts noted in the liver. GALLBLADDER AND BILE DUCTS: Unremarkable. No biliary ductal dilatation. SPLEEN: No acute abnormality. PANCREAS: No acute abnormality. ADRENAL GLANDS: No acute abnormality. KIDNEYS, URETERS AND BLADDER: Intermediate density lesion within the posterior left interpolar region measuring 2.7 x 2.1 x 2.5 cm, which was not present on the remote CT from 2020. Dilated fluid filled structure containing multiple large calculi in the upper pole of the left kidney, otherwise unchanged, likely dilated fluid filled calyx. Scattered regions of cortical scarring in the upper pole of the left kidney. The urinary bladder is moderately distended, without focal abnormality. No perinephric or periureteral stranding. GI AND BOWEL: Stomach demonstrates no acute abnormality. There is no bowel obstruction. REPRODUCTIVE ORGANS: The prostate was not well visualized due to a metallic streak artifact from the right hip arthroplasty. PERITONEUM AND RETROPERITONEUM: No ascites. No free air. VASCULATURE: Large fusiform aneurysm of the infrarenal aorta measuring 6.2 x 6.3 x 8.7 cm (AP by transverse by craniocaudal). This aneurysm has significantly increased in size in the interim, previously measuring 4.6 x 5 x 6.4 cm. No  retroperitoneal hematoma or periaortic inflammatory stranding. Diffuse aortoiliac atherosclerosis. ABDOMINAL AND PELVIS LYMPH NODES: No lymphadenopathy. BONES AND SOFT TISSUES: Diffuse osteopenia. Remote posterior left 11th rib fracture. Multilevel degenerative disc disease of the visualized spine. Chronic severe compression fracture of L1. Mild chronic compression fracture of L2. Age indeterminate mild compression fracture of T12. No retropulsion. Right hip arthroplasty is anatomically aligned without dislocation. No focal soft tissue abnormality. IMPRESSION: 1. Age indeterminate, mild compression fracture of T12 without retropulsion. Correlation with point tenderness recommended to assess for acuity. 2. Significant enlargement of a large , fusiform aneurysm of the infrarenal aorta, measuring 6.2x6.3x8.7 cm ( previously 4.6x5x6.4 cm). No retroperitoneal hematoma or periaortic inflammation to suggest impending rupture. Nonemergent surgical consultation recommended. 3. Intermediate density lesion within the interpolar region of the posterior left kidney , measuring 2.7x2.1x2.5 cm , which was not present on a prior CT from 2020 . While this could represent a proteinaceous or hemorrhagic cyst, a nonemergent multiphase abdominal MRI with iv contrast is recommended to exclude a developing neoplasm. . Electronically signed by: Rogelia Myers MD 03/19/2024 12:46 PM EST RP Workstation: [id number]   CT CERVICAL SPINE WO CONTRAST Result Date: 03/19/2024 EXAM: CT CERVICAL SPINE WITHOUT CONTRAST 03/19/2024 12:21:23 PM TECHNIQUE: CT of the cervical spine was performed without the administration of intravenous contrast. Multiplanar reformatted images  are provided for review. Automated exposure control, iterative reconstruction, and/or weight based adjustment of the mA/kV was utilized to reduce the radiation dose to as low as reasonably achievable. COMPARISON: None available. CLINICAL HISTORY: Polytrauma, blunt. Tripped over a  cat and fell. FINDINGS: BONES AND ALIGNMENT: Cervical spine straightening. No listhesis. No acute fracture. DEGENERATIVE CHANGES: Mild cervical spondylosis. No evidence of high grade spinal canal stenosis. SOFT TISSUES: No prevertebral soft tissue swelling. IMPRESSION: 1. No acute cervical spine fracture or traumatic malalignment. Electronically signed by: Dasie Hamburg MD 03/19/2024 12:30 PM EST RP Workstation: [id number]   CT HEAD WO CONTRAST Result Date: 03/19/2024 EXAM: CT HEAD WITHOUT CONTRAST 03/19/2024 12:21:23 PM TECHNIQUE: CT of the head was performed without the administration of intravenous contrast. Automated exposure control, iterative reconstruction, and/or weight based adjustment of the mA/kV was utilized to reduce the radiation dose to as low as reasonably achievable. COMPARISON: Head CT 12/31/2020 and MRI 10/03/2021. CLINICAL HISTORY: Head trauma, moderate-severe. FINDINGS: BRAIN AND VENTRICLES: There is no evidence of an acute infarct, intracranial hemorrhage, mass, midline shift, or extra-axial fluid collection. Ventriculomegaly is unchanged from the prior MRI and favored to reflect central predominant cerebral atrophy rather than hydrocephalus. Hypodensities in the cerebral white matter bilaterally are nonspecific but compatible with mild chronic small vessel ischemic disease. Calcified atherosclerosis at the skull base. ORBITS: No acute abnormality. SINUSES: Moderate mild ethmoid air cell opacification. Mild mucosal thickening in the right sphenoid sinus. Clear mastoid air cells. SOFT TISSUES AND SKULL: No acute soft tissue abnormality. No skull fracture. IMPRESSION: 1. No acute intracranial abnormality. Electronically signed by: Dasie Hamburg MD 03/19/2024 12:28 PM EST RP Workstation: [id number]    EKG/Medicine tests: Not indicated                Interventions: Morphine , oxycodone   See the EMR for full details regarding lab and imaging results.   Currently, patient is awake, alert, and  protecting own airway and is hemodynamically stable.  Discussed with patient and wife regarding his goals of care,.  Given he is experiencing severe pain that they feel may limit his mobility, patient and wife are amenable to obtaining labs and imaging to further evaluate for acute traumatic injuries which I feel is very reasonable.  Therefore trauma scans ordered, CT head and CT C-spine indicated given age of patient, patient warrants a T and L spine imaging given he has midline tenderness to palpation, and will obtain completion scan of the chest abdomen pelvis given scanning these areas is necessary in order to obtain imaging of the T and L-spine.  Notified by radiology tech that patient was taken over the scanner, however when they attempted to move him over to the CT scan, patient reported that he had too much pain and declined to get the scans done.  Reevaluated patient and discussed with patient and wife at bedside, offered options of no further workup and discharge with medications if his primary goal is comfort, however patient and wife would still like to investigate what is the etiology of his pain, thus we will give additional pain medications and try again to obtain scans.  Imaging does reveal acute T12 fracture.  Do not feel the patient requires neurosurgery consult given he does not have any focal neurologic deficits and he is on hospice.  Will order TLSO brace for comfort..  Oxycodone  ordered.  Plan for TLSO, and trial of ambulation, if patient is able to ambulate with oxycodone  and TLSO, discharged with plan for further management by outpatient  hospice, however patient is unable to ambulate at baseline which we will also order, consider TOC status for possible placement in an inpatient hospice facility  Presentation is most consistent with acute complicated illness, Presentation is most consistent with acute life/limb-threatening illness, and I did consider and rule out acute  life/limb-threatening illness  Discussion of management or test interpretations with external provider(s): Not indicated  Risk Drugs:OTC drugs, Prescription drug management, and Parenteral controlled substances  Disposition: HANDOFF: At the time of signout, the patients TLSO brace and trial of ambulation had not yet been completed. I transferred care of the patient at the time of signout to Dr. Gennaro. I informed the incoming care provider of the patient's history, status, and management plan. I addressed all of their concerns and/or questions to the best of my ability. Please refer to the incoming care provider's note for details regarding the remainder of the patient's ED course and disposition.  MDM generated using voice dictation software and may contain dictation errors.  Please contact me for any clarification or with any questions.  Clinical Impression:  1. Compression fracture of T12 vertebra, initial encounter (HCC)      Data Unavailable   Final Clinical Impression(s) / ED Diagnoses Final diagnoses:  Compression fracture of T12 vertebra, initial encounter [facility])    Rx / DC Orders ED Discharge Orders     None        Rogelia Jerilynn RAMAN, MD 03/19/24 1512  "

## 2024-03-19 NOTE — ED Provider Notes (Signed)
 I took over care of patient at 3:00 PM. He was found to have T12 acute fracture and known L1 fracture. TLSO brace given to patient and his pain has improved and he is able to ambulate without difficulty.  Physical Exam  BP 120/80 (BP Location: Right Arm)   Pulse (!) 53   Temp 98.1 F (36.7 C) (Oral)   Resp 18   SpO2 99%     ED Course / MDM    Medical Decision Making Patient given TLSO brace and he is feeling much improved.  He was able to ambulate without difficulty.  Will give prescription for oxycodone  and a dose of Dilaudid  here.  Discussed admission versus discharge with him and he would like to be discharged home his wife will take him home.  Advised return for new or worsening symptoms.  He feels comfortable to plan.  Problems Addressed: Compression fracture of T12 vertebra, initial encounter [facility]): acute illness or injury Fall, initial encounter: acute illness or injury  Amount and/or Complexity of Data Reviewed Labs: ordered. Radiology: ordered.  Risk OTC drugs. Prescription drug management. Parenteral controlled substances.          Gennaro Duwaine CROME, DO 03/19/24 1718

## 2024-03-19 NOTE — ED Notes (Signed)
 Patient Alert and oriented to baseline. Stable and ambulatory to baseline. Patient verbalized understanding of the discharge instructions.  Patient belongings were taken by the patient.

## 2024-03-19 NOTE — Progress Notes (Signed)
 [facility] ED [facility] Liaison Note  This patient is a current hospice patient with [facility], admitted with a terminal diagnosis of Cerebrovascular Disease.  Patient is a DNR code and Jasiyah Paulding [phone] is his primary caregiver.   We will continue to follow for any discharge planning needs and to coordinate continuation of hospice care.  Please don't hesitate to call with any Hospice related questions or concerns.  Thank you Inocente Jacobs RN BSN [facility] Liaison [phone]

## 2024-03-19 NOTE — Discharge Instructions (Addendum)
 Wear TLSO brace as needed for support and pain control.  You can also continue your pain medications as needed.  Follow-up with neurosurgery in 2 to 4 weeks.

## 2024-03-19 NOTE — ED Triage Notes (Signed)
 Pt bib ems from home after fall after tripping over cat. Did not hit head, no LOC. Pt is on blood thinners. Pt with lower back pain. Given 650mg  tylenol  pta.  104/70 HR 68 RR 20 99% RA CBG 106

## 2024-04-13 DEATH — deceased

## 2024-05-26 ENCOUNTER — Ambulatory Visit: Payer: Self-pay | Admitting: Adult Health
